# Patient Record
Sex: Female | Born: 1955 | Race: White | Hispanic: No | Marital: Married | Smoking: Current every day smoker
Health system: Southern US, Community
[De-identification: ages and names within clinical notes are randomized; demographics above are authoritative.]

## PROBLEM LIST (undated history)

## (undated) DIAGNOSIS — Z720 Tobacco use: Secondary | ICD-10-CM

## (undated) DIAGNOSIS — I4892 Unspecified atrial flutter: Secondary | ICD-10-CM

## (undated) DIAGNOSIS — R911 Solitary pulmonary nodule: Secondary | ICD-10-CM

## (undated) DIAGNOSIS — Z973 Presence of spectacles and contact lenses: Secondary | ICD-10-CM

## (undated) DIAGNOSIS — E559 Vitamin D deficiency, unspecified: Secondary | ICD-10-CM

## (undated) DIAGNOSIS — I739 Peripheral vascular disease, unspecified: Secondary | ICD-10-CM

## (undated) DIAGNOSIS — C801 Malignant (primary) neoplasm, unspecified: Secondary | ICD-10-CM

## (undated) DIAGNOSIS — I10 Essential (primary) hypertension: Secondary | ICD-10-CM

## (undated) DIAGNOSIS — E785 Hyperlipidemia, unspecified: Secondary | ICD-10-CM

## (undated) DIAGNOSIS — J449 Chronic obstructive pulmonary disease, unspecified: Secondary | ICD-10-CM

## (undated) HISTORY — PX: TUBAL LIGATION: SHX77

## (undated) HISTORY — PX: OVARIAN CYST REMOVAL: SHX89

## (undated) HISTORY — PX: TONSILLECTOMY: SUR1361

---

## 2019-02-09 ENCOUNTER — Other Ambulatory Visit (HOSPITAL_COMMUNITY): Payer: Self-pay | Admitting: Internal Medicine

## 2019-02-09 ENCOUNTER — Ambulatory Visit (HOSPITAL_COMMUNITY)
Admission: RE | Admit: 2019-02-09 | Discharge: 2019-02-09 | Disposition: A | Discharge status: Home / Self Care | Payer: Medicare Other | Source: Ambulatory Visit | Attending: Family | Admitting: Family

## 2019-02-09 ENCOUNTER — Other Ambulatory Visit: Payer: Self-pay

## 2019-02-09 DIAGNOSIS — R0989 Other specified symptoms and signs involving the circulatory and respiratory systems: Secondary | ICD-10-CM | POA: Diagnosis not present

## 2019-02-21 ENCOUNTER — Other Ambulatory Visit: Payer: Self-pay | Admitting: Internal Medicine

## 2019-02-21 DIAGNOSIS — R011 Cardiac murmur, unspecified: Secondary | ICD-10-CM

## 2019-02-22 ENCOUNTER — Other Ambulatory Visit: Payer: Self-pay

## 2019-02-22 ENCOUNTER — Ambulatory Visit (INDEPENDENT_AMBULATORY_CARE_PROVIDER_SITE_OTHER): Payer: Medicare Other | Admitting: Cardiology

## 2019-02-22 ENCOUNTER — Ambulatory Visit: Payer: Medicare Other

## 2019-02-22 ENCOUNTER — Other Ambulatory Visit: Payer: Self-pay | Admitting: Cardiology

## 2019-02-22 ENCOUNTER — Encounter (HOSPITAL_COMMUNITY): Payer: Self-pay | Admitting: Cardiology

## 2019-02-22 ENCOUNTER — Emergency Department (HOSPITAL_COMMUNITY)
Admission: EM | Admit: 2019-02-22 | Discharge: 2019-02-22 | Disposition: A | Discharge status: Home / Self Care | Payer: Medicare Other | Attending: Emergency Medicine | Admitting: Emergency Medicine

## 2019-02-22 DIAGNOSIS — I483 Typical atrial flutter: Secondary | ICD-10-CM

## 2019-02-22 DIAGNOSIS — R011 Cardiac murmur, unspecified: Secondary | ICD-10-CM

## 2019-02-22 DIAGNOSIS — I4892 Unspecified atrial flutter: Secondary | ICD-10-CM

## 2019-02-22 DIAGNOSIS — R000 Tachycardia, unspecified: Secondary | ICD-10-CM

## 2019-02-22 HISTORY — DX: Peripheral vascular disease, unspecified: I73.9

## 2019-02-22 HISTORY — DX: Hyperlipidemia, unspecified: E78.5

## 2019-02-22 HISTORY — DX: Essential (primary) hypertension: I10

## 2019-02-22 HISTORY — DX: Tobacco use: Z72.0

## 2019-02-22 MED ORDER — APIXABAN 5 MG PO TABS: 5.0000 mg | ORAL_TABLET | Freq: Two times a day (BID) | ORAL | 2 refills | Status: DC

## 2019-02-22 MED ORDER — DILTIAZEM HCL 30 MG PO TABS: 30.0000 mg | ORAL_TABLET | Freq: Three times a day (TID) | ORAL | 2 refills | Status: DC | PRN

## 2019-02-22 NOTE — ED Triage Notes (Signed)
Cardiology NP called to inform me they were sending this pt due to echo showing atrial flutter. Pt denies any CP or sob.

## 2019-02-22 NOTE — ED Provider Notes (Signed)
MSE was initiated and I personally evaluated the patient and placed orders (if any) at  5:31 PM on February 22, 2019.  The patient appears stable so that the remainder of the MSE may be completed by another provider.  Transferred from Drs. Office for atrial flutter with tachycardia.  However upon arrival to the ER peers to be in sinus rhythm.  Seen in the ER Dr. Virgina Jock, cardiologist.  He did the primary care of the patient and I only did an initial screening.   Davonna Belling, MD 02/22/19 1731

## 2019-02-22 NOTE — Consult Note (Signed)
CARDIOLOGY CONSULT NOTE  [name] MRN: [id number] DOB/AGE: [birth year] 63 y.o.  Admit date: 02/22/2019 Referring Physician: [facility] Primary Physician: Dr. [name] Reason for Consultation:  Tachycardia  HPI:   63 year old Caucasian female with hypertension, hyperlipidemia, tobacco abuse, subclavian and carotid stenoses, with episode of tachycardia.  Patient was referred for outpatient echocardiogram for a cardiac murmur by her PCP Dr. Haynes Kerns.  During echocardiogram, patient was found to be in tachycardia.  On questioning, patient reports that she has been having palpitations since noon today.  She denied any chest pain, shortness of breath.  She was sent to the ED.  On arrival to the ED, patient was back to normal rhythm.  At baseline, she is exertional dyspnea, which is stable.  She does endorse bilateral arm, as well as occasional lower extremity claudication.  It appears that she was scheduled to undergo CT scan through her PCP.   Past Medical History:  Diagnosis Date  . Hyperlipidemia   . Hypertension   . Tobacco abuse      History reviewed. No pertinent surgical history.   Family History  Problem Relation Age of Onset  . Heart disease Neg Hx      Social History: Social History   Socioeconomic History  . Marital status: Married    Spouse name: Not on file  . Number of children: Not on file  . Years of education: Not on file  . Highest education level: Not on file  Occupational History  . Not on file  Social Needs  . Financial resource strain: Not on file  . Food insecurity    Worry: Not on file    Inability: Not on file  . Transportation needs    Medical: Not on file    Non-medical: Not on file  Tobacco Use  . Smoking status: Not on file  Substance and Sexual Activity  . Alcohol use: Not on file  . Drug use: Not on file  . Sexual activity: Not on file  Lifestyle  . Physical activity    Days per week: Not on file    Minutes  per session: Not on file  . Stress: Not on file  Relationships  . Social Herbalist on phone: Not on file    Gets together: Not on file    Attends religious service: Not on file    Active member of club or organization: Not on file    Attends meetings of clubs or organizations: Not on file    Relationship status: Not on file  . Intimate partner violence    Fear of current or ex partner: Not on file    Emotionally abused: Not on file    Physically abused: Not on file    Forced sexual activity: Not on file  Other Topics Concern  . Not on file  Social History Narrative  . Not on file     Review of Systems  Constitution: Negative for decreased appetite, malaise/fatigue, weight gain and weight loss.  HENT: Negative for congestion.   Eyes: Negative for visual disturbance.  Cardiovascular: Positive for claudication and dyspnea on exertion (Chronic, stable). Negative for chest pain, leg swelling, palpitations and syncope.  Respiratory: Negative for cough.   Endocrine: Negative for cold intolerance.  Hematologic/Lymphatic: Does not bruise/bleed easily.  Skin: Negative for itching and rash.  Musculoskeletal: Negative for myalgias.  Gastrointestinal: Negative for abdominal pain, nausea and vomiting.  Genitourinary: Negative for dysuria.  Neurological: Negative for  dizziness and weakness.  Psychiatric/Behavioral: The patient is not nervous/anxious.   All other systems reviewed and are negative.     Physical Exam: Physical Exam  Constitutional: She is oriented to person, place, and time. She appears well-developed and well-nourished. No distress.  HENT:  Head: Normocephalic and atraumatic.  Eyes: Pupils are equal, round, and reactive to light. Conjunctivae are normal.  Neck: No JVD present.  Cardiovascular: Normal rate, regular rhythm and intact distal pulses. Exam reveals decreased pulses.  Murmur heard.  Harsh early systolic murmur is present with a grade of 2/6 at the  upper right sternal border radiating to the neck. Pulmonary/Chest: Effort normal and breath sounds normal. She has no wheezes. She has no rales.  Abdominal: Soft. Bowel sounds are normal. There is no rebound.  Musculoskeletal:        General: No edema.  Lymphadenopathy:    She has no cervical adenopathy.  Neurological: She is alert and oriented to person, place, and time. No cranial nerve deficit.  Skin: Skin is warm and dry.  Psychiatric: She has a normal mood and affect.  Nursing note and vitals reviewed.     Radiology: No results found.  Scheduled Meds: Continuous Infusions: PRN Meds:.  CARDIAC STUDIES:  EKG 02/22/2019 1646: Sinus rhythm 94 bpm. LVH. Nonspecific ST-T abnormality.  EKG 02/22/2019 1613: Typical atrial flutter with RVR 160 bpm. Nonspecific ST-T changes.   Carotid US 02/09/2019: Right Carotid: Velocities in the right ICA are consistent with a 1-39% stenosis.                Non-hemodynamically significant plaque <50% noted in the CCA.  Left Carotid: Velocities in the left ICA are consistent with a 60-79% stenosis.               Non-hemodynamically significant plaque <50% noted in the CCA.  Vertebrals:  Right vertebral artery demonstrates retrograde flow. Left vertebral              artery demonstrates bidirectional flow. Subclavians: Bilateral subclavian waveforms are monophasic. Waveform morphology              and abnormal flow pattern in bilateral vertebral arteries suggest              proximal obstruction. Unable to visualize proximally.   Assessment & Recommendations:  63 year old Caucasian female with hypertension, hyperlipidemia, tobacco abuse, subclavian and carotid stenoses, with paroxysmal atrial flutter  Paroxysmal atrial flutter: Self converted on arrival to the ED. CHA2DS2VASc score 4, annual stroke risk 5%. Started eliquis 5 mg bid, along with as needed diltiazem 30 mg. I will obtain outpatient labwork through PCP to ensure normal  baseline renal function and CBC (patients reports no abnormalities). Will also check TSH. Will arrange outpatient echocardiogram and event monitor  PAD: Suspect bilateral subclavian stenoses, as well as lower extremity PAD. Recommend regular exercise and aggressive risk factor modification. Will obtain outpatient CTA.  Murmur: Likely mild aortic stenosis. Will obtain outpatient echocardiogram.  Hypertension, hyperlipidemia: Continue olmesartan 20 mg, crestor 20 mg   Nigel Mormon, MD 02/22/2019, 5:43 PM [facility] Pager: [phone] Office: [phone] If no answer Cell [phone]

## 2019-02-22 NOTE — ED Notes (Signed)
Pt discharge instructions reviewed with pt. Pt verbalized understanding of discharge instructions. Pt discharged.

## 2019-02-23 NOTE — Progress Notes (Signed)
Patient was here for echocardiogram vomiting, I was notified by echo tech of elevated heart rate.  EKG was performed revealing atrial flutter with RVR at 162 bpm.  She has been noticing increased fatigue and tiredness over the last few days.  I recommended she be further evaluated in the emergency room. She is not in any acute distress. No difficulty breathing. Her husband is present at the bedside, and will provide transportation.  Dr. Virgina Jock will see the patient in the ER.

## 2019-03-03 ENCOUNTER — Ambulatory Visit (INDEPENDENT_AMBULATORY_CARE_PROVIDER_SITE_OTHER): Payer: Medicare Other

## 2019-03-03 ENCOUNTER — Other Ambulatory Visit: Payer: Self-pay

## 2019-03-03 ENCOUNTER — Ambulatory Visit: Payer: Medicare Other

## 2019-03-03 ENCOUNTER — Ambulatory Visit: Payer: Medicare Other | Admitting: Cardiology

## 2019-03-03 DIAGNOSIS — R011 Cardiac murmur, unspecified: Secondary | ICD-10-CM | POA: Diagnosis not present

## 2019-03-03 DIAGNOSIS — I483 Typical atrial flutter: Secondary | ICD-10-CM

## 2019-03-07 ENCOUNTER — Other Ambulatory Visit: Payer: Self-pay

## 2019-03-07 ENCOUNTER — Telehealth: Payer: Self-pay

## 2019-03-07 NOTE — Telephone Encounter (Signed)
Telephone encounter:  Reason for call: Pt wants to make sure its ok she is on Diltiazem and olmesartan  Usual provider: MP  Last office visit: NA  Next office visit: 03/31/19   Last hospitalization: 02/22/19   Current Outpatient Medications on File Prior to Visit  Medication Sig Dispense Refill  . apixaban (ELIQUIS) 5 MG TABS tablet Take 1 tablet (5 mg total) by mouth 2 (two) times daily. 60 tablet 2  . diltiazem (CARDIZEM) 30 MG tablet Take 1 tablet (30 mg total) by mouth 3 (three) times daily as needed. 30 tablet 2  . olmesartan (BENICAR) 20 MG tablet Take 20 mg by mouth at bedtime.    . rosuvastatin (CRESTOR) 20 MG tablet Take 20 mg by mouth daily.     No current facility-administered medications on file prior to visit.

## 2019-03-07 NOTE — Telephone Encounter (Signed)
S/w pt advised her ok to take both meds.

## 2019-03-07 NOTE — Telephone Encounter (Signed)
Yes

## 2019-03-08 ENCOUNTER — Other Ambulatory Visit: Payer: Self-pay | Admitting: Internal Medicine

## 2019-03-08 DIAGNOSIS — Z720 Tobacco use: Secondary | ICD-10-CM

## 2019-03-30 ENCOUNTER — Ambulatory Visit
Admission: RE | Admit: 2019-03-30 | Discharge: 2019-03-30 | Disposition: A | Discharge status: Home / Self Care | Payer: Medicare Other | Source: Ambulatory Visit | Attending: Internal Medicine | Admitting: Internal Medicine

## 2019-03-30 DIAGNOSIS — Z720 Tobacco use: Secondary | ICD-10-CM

## 2019-03-31 ENCOUNTER — Ambulatory Visit: Payer: Medicare Other | Admitting: Cardiology

## 2019-03-31 ENCOUNTER — Other Ambulatory Visit: Payer: Self-pay

## 2019-03-31 ENCOUNTER — Encounter: Payer: Self-pay | Admitting: Cardiology

## 2019-03-31 VITALS — BP 106/63 | HR 83 | Temp 96.1°F | Resp 16 | Ht 62.0 in | Wt 173.3 lb

## 2019-03-31 DIAGNOSIS — F17200 Nicotine dependence, unspecified, uncomplicated: Secondary | ICD-10-CM | POA: Insufficient documentation

## 2019-03-31 DIAGNOSIS — I739 Peripheral vascular disease, unspecified: Secondary | ICD-10-CM

## 2019-03-31 DIAGNOSIS — I4892 Unspecified atrial flutter: Secondary | ICD-10-CM | POA: Diagnosis not present

## 2019-03-31 DIAGNOSIS — F17210 Nicotine dependence, cigarettes, uncomplicated: Secondary | ICD-10-CM

## 2019-03-31 DIAGNOSIS — R0989 Other specified symptoms and signs involving the circulatory and respiratory systems: Secondary | ICD-10-CM | POA: Insufficient documentation

## 2019-03-31 DIAGNOSIS — I2510 Atherosclerotic heart disease of native coronary artery without angina pectoris: Secondary | ICD-10-CM | POA: Insufficient documentation

## 2019-03-31 DIAGNOSIS — I700 Atherosclerosis of aorta: Secondary | ICD-10-CM | POA: Diagnosis not present

## 2019-03-31 DIAGNOSIS — I351 Nonrheumatic aortic (valve) insufficiency: Secondary | ICD-10-CM

## 2019-03-31 MED ORDER — ASPIRIN EC 81 MG PO TBEC: 81.0000 mg | DELAYED_RELEASE_TABLET | Freq: Every day | ORAL | 3 refills | Status: AC

## 2019-03-31 NOTE — Progress Notes (Signed)
Follow up visit  Subjective:   Betty Anderson, female    DOB: 1955-08-06, 63 y.o.   MRN: [id number]     HPI  63 year old Caucasian female with hypertension, hyperlipidemia, tobacco abuse, subclavian and carotid stenoses, with paroxysmal atrial flutter.  Patient had an episode of tachycardia during her initial echocardiogram visit in November 2020 that led to ED admission.  Atrial flutter was resolved on presentation to the ED.  Patient has been for event monitor since, without any recurrence of atrial flutter.  Work-up thus far has been remarkable for trace aortic stenosis, moderate grade 2 aortic regurgitation, grade 2 diastolic dysfunction on echocardiogram.  Vascular ultrasound was notable for possible bilateral proximal subclavian obstruction.  Patient underwent lung cancer screening CT chest today that showed suspicious lung nodule, paratracheal lymph node, as well as chronic interstitial changes, in addition to emphysema, aortic and coronary atherosclerosis.  Patient is here for follow up today. She denies any chest pain. She has had stable exertional dyspnea for a long time. She has shoulder pain, but denies any specific arm claudication symptoms. She has mild dizziness on getting up from sitting position, but has had it for several years, without any syncope episodes.    Current Outpatient Medications on File Prior to Visit  Medication Sig Dispense Refill  . apixaban (ELIQUIS) 5 MG TABS tablet Take 1 tablet (5 mg total) by mouth 2 (two) times daily. 60 tablet 2  . diltiazem (CARDIZEM) 30 MG tablet Take 1 tablet (30 mg total) by mouth 3 (three) times daily as needed. 30 tablet 2  . olmesartan (BENICAR) 20 MG tablet Take 20 mg by mouth at bedtime.    . rosuvastatin (CRESTOR) 20 MG tablet Take 20 mg by mouth daily.     No current facility-administered medications on file prior to visit.    Cardiovascular & other pertient studies:  EKG 02/22/2019 1646: Sinus rhythm 94  bpm. LVH. Nonspecific ST-T abnormality.  EKG 02/22/2019 1613: Typical atrial flutter with RVR 160 bpm. Nonspecific ST-T changes.  CT Chest lung cancer screening 03/30/2019: 1. Lung-RADS 4A, suspicious. Follow up low-dose chest CT without contrast in 3 months (please use the following order, "CT CHEST LCS NODULE FOLLOW-UP W/O CM") is recommended. Alternatively, PET may be considered when there is a solid component 55mm or larger. 2. Enlarged right paratracheal lymph node. In light of the suspicious nodule in the right upper lobe this is concerning for metastatic adenopathy. 3. Chronic interstitial changes with a lower lung zone predominance which may represent early nonspecific interstitial pneumonia versus usual interstitial pneumonia. Consider further evaluation with high-resolution CT of the chest. 4. Aortic Atherosclerosis (ICD10-I70.0) and Emphysema (ICD10-J43.9). 5. Coronary artery calcifications.   Echocardiogram 03/05/2019: Left ventricle cavity is normal in size. Moderate concentric hypertrophy of the left ventricle. Normal LV systolic function with visual EF 50-55%. Normal global wall motion. Doppler evidence of grade II (pseudonormal) diastolic dysfunction, elevated LAP. Calculated EF 47%. Trileaflet aortic valve with mild aortic valve leaflet calcification. Trace aortic stenosis. Moderate (Grade II) aortic regurgitation. Inadequate TR jet to estimate pulmonary artery systolic pressure. Estimated RA pressure 8 mmHg.  Vascular ultrasound 02/09/2019: Right Carotid: Velocities in the right ICA are consistent with a 1-39% stenosis.                Non-hemodynamically significant plaque <50% noted in the CCA.  Left Carotid: Velocities in the left ICA are consistent with a 60-79% stenosis.  Non-hemodynamically significant plaque <50% noted in the CCA.  Vertebrals:  Right vertebral artery demonstrates retrograde flow. Left vertebral              artery  demonstrates bidirectional flow. Subclavians: Bilateral subclavian waveforms are monophasic. Waveform morphology              and abnormal flow pattern in bilateral vertebral arteries suggest              proximal obstruction. Unable to visualize proximally.   Recent labs: Not available   Review of Systems  Constitution: Negative for decreased appetite, malaise/fatigue, weight gain and weight loss.  HENT: Negative for congestion.   Eyes: Negative for visual disturbance.  Cardiovascular: Positive for dyspnea on exertion. Negative for chest pain, leg swelling, palpitations and syncope.  Respiratory: Positive for cough.   Endocrine: Negative for cold intolerance.  Hematologic/Lymphatic: Does not bruise/bleed easily.  Skin: Negative for itching and rash.  Musculoskeletal: Negative for myalgias.  Gastrointestinal: Negative for abdominal pain, nausea and vomiting.  Genitourinary: Negative for dysuria.  Neurological: Negative for dizziness and weakness.  Psychiatric/Behavioral: The patient is not nervous/anxious.   All other systems reviewed and are negative.        Vitals:   03/31/19 1157  BP: 106/63  Resp: 16  Temp: (!) 96.1 F (35.6 C)  SpO2: 100%    Body mass index is 31.7 kg/m. Filed Weights   03/31/19 1157  Weight: 173 lb 4.8 oz (78.6 kg)     Objective:   Physical Exam  Constitutional: She is oriented to person, place, and time. She appears well-developed and well-nourished. No distress.  HENT:  Head: Normocephalic and atraumatic.  Eyes: Pupils are equal, round, and reactive to light. Conjunctivae are normal.  Neck: No JVD present.  Cardiovascular: Normal rate and regular rhythm.  Murmur heard.  Early systolic murmur is present with a grade of 2/6 at the upper right sternal border.  Early diastolic murmur is present with a grade of 2/6 at the upper right sternal border. No critical limb iscehmia Pulses:      Carotid pulses are on the right side with bruit  and on the left side with bruit.      Dorsalis pedis pulses are 0 on the right side and 0 on the left side.       Posterior tibial pulses are 0 on the right side and 0 on the left side.  Pulmonary/Chest: Effort normal and breath sounds normal. She has no wheezes. She has no rales.  Abdominal: Soft. Bowel sounds are normal. There is no rebound.  Musculoskeletal:        General: No edema.  Lymphadenopathy:    She has no cervical adenopathy.  Neurological: She is alert and oriented to person, place, and time. No cranial nerve deficit.  Skin: Skin is warm and dry.  Psychiatric: She has a normal mood and affect.  Nursing note and vitals reviewed.         Assessment & Recommendations:   63 year old Caucasian female with hypertension, hyperlipidemia, tobacco abuse, PAD with subclavian and carotid stenoses, with paroxysmal atrial flutter, now with suspicious lung nodule.  Paroxysmal atrial flutter: CHA2DS2VASc score 4, annual stroke risk 5%.  No recurrence after one episode in 01/2019. Can stop eliquis.  Ok to stop diltiazem 30 mg tid  Trace AS, mod AI: Clinically asymptomatic.  Will repeat echocardiogram in 1 year,  CAD, PAD: She has coronary and aortic atherosclerosis. She has moderate carotid as well  as possibly severe subclavian stenoses. She is asymptomatic from these. Recommend aggressive risk factor modification. Recommend Aspirin 81 mg daily. Continue rosuvastatin. Most importantly, needs to quit smoking.  Lung nodule: Seen on lung cancer CT chest ordered by Dr. Joylene Draft, and performed on 03/30/2019. Patient has not received the results yet. She has appt with Dr. Joylene Draft in February. She will likely need follow up studies, bases on the findings. I will noto=ify Dr. Joylene Draft of the results, so he can continue further workup, as necessary.   Tobacco cessation counseling: - Currently smoking 3 packs/day   - Patient was informed of the dangers of tobacco abuse including stroke, cancer,  and MI, as well as benefits of tobacco cessation. - Patient is NOT willing to quit at this time. - Approximately 5 mins were spent counseling patient cessation techniques. We discussed various methods to help quit smoking, including deciding on a date to quit, joining a support group, pharmacological agents. - I will reassess her progress at the next follow-up visit   Coronary atherosclerosis, PAD:    Nigel Mormon, MD [facility] Pager: [phone] Office: [phone]

## 2019-04-06 ENCOUNTER — Ambulatory Visit (INDEPENDENT_AMBULATORY_CARE_PROVIDER_SITE_OTHER): Payer: Medicare Other | Admitting: Pulmonary Disease

## 2019-04-06 ENCOUNTER — Encounter: Payer: Self-pay | Admitting: Pulmonary Disease

## 2019-04-06 ENCOUNTER — Other Ambulatory Visit: Payer: Self-pay

## 2019-04-06 DIAGNOSIS — C3490 Malignant neoplasm of unspecified part of unspecified bronchus or lung: Secondary | ICD-10-CM | POA: Diagnosis not present

## 2019-04-06 DIAGNOSIS — R918 Other nonspecific abnormal finding of lung field: Secondary | ICD-10-CM

## 2019-04-06 DIAGNOSIS — R590 Localized enlarged lymph nodes: Secondary | ICD-10-CM

## 2019-04-06 NOTE — Progress Notes (Signed)
Virtual Visit via Telephone Note  I connected with Betty Anderson on 04/06/19 at  2:00 PM EST by telephone and verified that I am speaking with the correct person using two identifiers.  Location: Patient: Betty Anderson  Provider: Garner Nash, DO   I discussed the limitations, risks, security and privacy concerns of performing an evaluation and management service by telephone and the availability of in person appointments. I also discussed with the patient that there may be a patient responsible charge related to this service. The patient expressed understanding and agreed to proceed.  History of Present Illness:  This is a 64 year old female, longstanding history of smoking, 50 years 3 packs a day at her max.  Patient had a lung cancer screening CT on March 30, 2019.  Patient was referred to our office for evaluation of tissue sampling and bronchoscopy by oncology.  Patient states today that she has not received any information about her recent lung cancer screening CT.  We went over this today in detail via phone.  She understands that we are concerned about a solid to 13.8 mm spiculated nodule as well as a enlarged 1.9 cm right paratracheal lymph node. These findings are concerning for a underlying malignancy.  Patient denies fevers chills night sweats weight loss.  Patient also denies hemoptysis.   Observations/Objective:  Able to converse in complete sentences.  Appears alert oriented. Has good insight and understanding to our discussion today  Assessment and Plan:  Abnormal lung cancer screening CT Enlarged right paratracheal lymph node suspicious for metastatic adenopathy Associated spiculated right upper lobe nodule Longstanding tobacco abuse history  Plan:  We will set patient up for video bronchoscopy with endobronchial ultrasound. Discussed procedure in detail to include risk benefits and alternatives. Patient was agreeable to proceed.  We will attempt to  schedule for Tuesday of next week. Patient will likely need PET scan as well as brain MRI I have placed these orders to get this started as well.  Case ID# [id number]  Jan 12th @ 10:00AM [facility] OR    Follow Up Instructions:  Bronchoscopy appt next week. This will likely be on Tuesday.    I discussed the assessment and treatment plan with the patient. The patient was provided an opportunity to ask questions and all were answered. The patient agreed with the plan and demonstrated an understanding of the instructions.   The patient was advised to call back or seek an in-person evaluation if the symptoms worsen or if the condition fails to improve as anticipated.  I provided 32 minutes of non-face-to-face time during this encounter.   Garner Nash, DO

## 2019-04-06 NOTE — Patient Instructions (Addendum)
Thank you for visiting Dr. Valeta Harms at [facility]. Today we recommend the following: Orders Placed This Encounter  Procedures  . NM PET - Initial Skull Base To Thigh  . Ambulatory referral to Pulmonology   Follow up in clinic in 4 weeks EBUS Bronchoscopy to be scheduled on 04/11/2018 at [facility]   Return in about 4 weeks (around 05/04/2019).    Please do your part to reduce the spread of COVID-19.

## 2019-04-09 ENCOUNTER — Other Ambulatory Visit (HOSPITAL_COMMUNITY)
Admission: RE | Admit: 2019-04-09 | Discharge: 2019-04-09 | Disposition: A | Discharge status: Home / Self Care | Payer: Medicare Other | Source: Ambulatory Visit | Attending: Pulmonary Disease | Admitting: Pulmonary Disease

## 2019-04-09 DIAGNOSIS — Z01812 Encounter for preprocedural laboratory examination: Secondary | ICD-10-CM | POA: Insufficient documentation

## 2019-04-09 DIAGNOSIS — Z20822 Contact with and (suspected) exposure to covid-19: Secondary | ICD-10-CM | POA: Insufficient documentation

## 2019-04-09 LAB — SARS CORONAVIRUS 2 (TAT 6-24 HRS): SARS Coronavirus 2: NEGATIVE

## 2019-04-11 ENCOUNTER — Encounter (HOSPITAL_COMMUNITY): Payer: Self-pay | Admitting: Pulmonary Disease

## 2019-04-11 ENCOUNTER — Other Ambulatory Visit: Payer: Self-pay

## 2019-04-11 NOTE — Anesthesia Preprocedure Evaluation (Addendum)
Anesthesia Evaluation  Patient identified by MRN, date of birth, ID band Patient awake    Reviewed: Allergy & Precautions, NPO status , Patient's Chart, lab work & pertinent test results  Airway Mallampati: I  TM Distance: >3 FB Neck ROM: Full    Dental  (+) Edentulous Upper, Edentulous Lower   Pulmonary COPD, Current Smoker,     + decreased breath sounds      Cardiovascular hypertension, Pt. on medications + CAD and + Peripheral Vascular Disease  + dysrhythmias Atrial Fibrillation  Rhythm:Regular Rate:Normal     Neuro/Psych negative neurological ROS  negative psych ROS   GI/Hepatic negative GI ROS, Neg liver ROS,   Endo/Other  negative endocrine ROS  Renal/GU negative Renal ROS     Musculoskeletal negative musculoskeletal ROS (+)   Abdominal Normal abdominal exam  (+)   Peds  Hematology negative hematology ROS (+)   Anesthesia Other Findings   Reproductive/Obstetrics                           COVID-19 Labs  No results for input(s): DDIMER, FERRITIN, LDH, CRP in the last 72 hours.  Lab Results  Component Value Date   [place] NEGATIVE 04/09/2019     Anesthesia Physical Anesthesia Plan  ASA: III  Anesthesia Plan: General   Post-op Pain Management:    Induction: Intravenous  PONV Risk Score and Plan: Ondansetron  Airway Management Planned: LMA  Additional Equipment: None  Intra-op Plan:   Post-operative Plan: Extubation in OR  Informed Consent: I have reviewed the patients History and Physical, chart, labs and discussed the procedure including the risks, benefits and alternatives for the proposed anesthesia with the patient or authorized representative who has indicated his/her understanding and acceptance.       Plan Discussed with: CRNA  Anesthesia Plan Comments: (Follows with Dr. Virgina Jock for hx of subclavian and carotid stenoses,paroxysmal atrial flutter.  Patient had an episode of tachycardia during her initial echocardiogram visit in November 2020 that led to ED admission.  Atrial flutter was resolved on presentation to the ED.  Patient then wore event monitor without any recurrence of atrial flutter.  Echo showed trace aortic stenosis, moderate grade 2 aortic regurgitation, grade 2 diastolic dysfunction.  Vascular ultrasound was notable for possible bilateral proximal subclavian obstruction.    Last seen by Dr. Virgina Jock 03/31/19. Per note,  "Paroxysmal atrial flutter: CHA2DS2VASc score 4, annual stroke risk 5%.  No recurrence after one episode in 01/2019. Can stop eliquis.  Ok to stop diltiazem 30 mg tid. Trace AS, mod AI: Clinically asymptomatic. Will repeat echocardiogram in 1 year. CAD, PAD: She has coronary and aortic atherosclerosis. She has moderate carotid as well as possibly severe subclavian stenoses. She is asymptomatic from these. Recommend aggressive risk factor modification. Recommend Aspirin 81 mg daily. Continue rosuvastatin. Most importantly, needs to quit smoking."  Recent lung cancer screening CT showed suspicious lung nodule, paratracheal lymph node, as well as chronic interstitial changes, in addition to emphysema, aortic and coronary atherosclerosis.  Will need DOS labs.  EKG 02/22/19: Normal sinus rhythm. Rate 94. Right atrial enlargement. Minimal voltage criteria for LVH, may be normal variant ( Sokolow-Lyon ). Nonspecific ST and T wave abnormality  Event monitor 03/03/2019 - 04/01/2019: Diagnostic time: 90%  Dominant rhythm: Sinus. HR 58-104 bpm. Avg HR 71 bpm. No atrial fibrillation/atrial flutter/SVT/VT/high grade AV block, sinus pause >3sec noted. Symptoms of chest pain, flutter sensation correlated with sinus rhythm. No arrhythmia noted  to explain patient's symptoms  Echocardiogram 03/05/2019: Left ventricle cavity is normal in size. Moderate concentric hypertrophy of the left ventricle. Normal LV systolic function with  visual EF 50-55%. Normal global wall motion. Doppler evidence of grade II (pseudonormal) diastolic dysfunction, elevated LAP. Calculated EF 47%. Trileaflet aortic valve with mild aortic valve leaflet calcification. Trace aortic stenosis. Moderate (Grade II) aortic regurgitation. Inadequate TR jet to estimate pulmonary artery systolic pressure. Estimated RA pressure 8 mmHg.  Carotid US 02/09/19: Summary: Right Carotid: Velocities in the right ICA are consistent with a 1-39% stenosis. Non-hemodynamically significant plaque <50% noted in the CCA. Left Carotid: Velocities in the left ICA are consistent with a 60-79% stenosis. Non-hemodynamically significant plaque <50% noted in the CCA. Vertebrals:  Right vertebral artery demonstrates retrograde flow. Left vertebral artery demonstrates bidirectional flow. Subclavians: Bilateral subclavian waveforms are monophasic. Waveform morphology and abnormal flow pattern in bilateral vertebral arteries suggest proximal obstruction. Unable to visualize proximally.)      Anesthesia Quick Evaluation

## 2019-04-11 NOTE — Progress Notes (Signed)
SDW-pre-op call completed by pt spouse, Jeneen Rinks, with pt verbal consent. Pt denies any acute cardiopulmonary issues. Spouse stated that pt is under the care of Dr. Virgina Jock, Cardiology and Dr. [name], PCP. Felicia, Surgical Coordinator, stated that pt did not have recent labs at cardiology office visit. Mickel Baas at [facility], Urology, stated that she will fax BMP results drawn 04/04/2019; awaiting fax. Spouse made aware to have pt stop taking vitamins, fish oil and herbal medications. Do not take any NSAIDs ie: Ibuprofen, Advil, Naproxen (Aleve), Motrin, BC and Goody Powder. Spouse verbalized understanding of all pre-op instructions. PA, Anesthesiology, asked to review pt history.

## 2019-04-11 NOTE — Progress Notes (Signed)
Anesthesia Chart Review: Same day workup  Follows with Dr. Virgina Jock for hx of subclavian and carotid stenoses,paroxysmal atrial flutter. Patient had an episode of tachycardia during her initial echocardiogram visit in November 2020 that led to ED admission.  Atrial flutter was resolved on presentation to the ED.  Patient then wore event monitor without any recurrence of atrial flutter.  Echo showed trace aortic stenosis, moderate grade 2 aortic regurgitation, grade 2 diastolic dysfunction.  Vascular ultrasound was notable for possible bilateral proximal subclavian obstruction.    Last seen by Dr. Virgina Jock 03/31/19. Per note,  "Paroxysmal atrial flutter: CHA2DS2VASc score 4, annual stroke risk 5%.  No recurrence after one episode in 01/2019. Can stop eliquis.  Ok to stop diltiazem 30 mg tid. Trace AS, mod AI: Clinically asymptomatic. Will repeat echocardiogram in 1 year. CAD, PAD: She has coronary and aortic atherosclerosis. She has moderate carotid as well as possibly severe subclavian stenoses. She is asymptomatic from these. Recommend aggressive risk factor modification. Recommend Aspirin 81 mg daily. Continue rosuvastatin. Most importantly, needs to quit smoking."  Recent lung cancer screening CT showed suspicious lung nodule, paratracheal lymph node, as well as chronic interstitial changes, in addition to emphysema, aortic and coronary atherosclerosis.  Will need DOS labs.  EKG 02/22/19: Normal sinus rhythm. Rate 94. Right atrial enlargement. Minimal voltage criteria for LVH, may be normal variant ( Sokolow-Lyon ). Nonspecific ST and T wave abnormality  Event monitor 03/03/2019 - 04/01/2019: Diagnostic time: 90%  Dominant rhythm: Sinus. HR 58-104 bpm. Avg HR 71 bpm. No atrial fibrillation/atrial flutter/SVT/VT/high grade AV block, sinus pause >3sec noted. Symptoms of chest pain, flutter sensation correlated with sinus rhythm. No arrhythmia noted to explain patient's  symptoms  Echocardiogram 03/05/2019: Left ventricle cavity is normal in size. Moderate concentric hypertrophy of the left ventricle. Normal LV systolic function with visual EF 50-55%. Normal global wall motion. Doppler evidence of grade II (pseudonormal) diastolic dysfunction, elevated LAP. Calculated EF 47%. Trileaflet aortic valve with mild aortic valve leaflet calcification. Trace aortic stenosis. Moderate (Grade II) aortic regurgitation. Inadequate TR jet to estimate pulmonary artery systolic pressure. Estimated RA pressure 8 mmHg.  Carotid US 02/09/19: Summary: Right Carotid: Velocities in the right ICA are consistent with a 1-39% stenosis. Non-hemodynamically significant plaque <50% noted in the CCA. Left Carotid: Velocities in the left ICA are consistent with a 60-79% stenosis. Non-hemodynamically significant plaque <50% noted in the CCA. Vertebrals:  Right vertebral artery demonstrates retrograde flow. Left vertebral artery demonstrates bidirectional flow. Subclavians: Bilateral subclavian waveforms are monophasic. Waveform morphology and abnormal flow pattern in bilateral vertebral arteries suggest proximal obstruction. Unable to visualize proximally.   Wynonia Musty [facility] Short Stay Center/Anesthesiology Phone [phone] 04/11/2019 4:07 PM]

## 2019-04-12 ENCOUNTER — Ambulatory Visit (HOSPITAL_COMMUNITY): Admission: RE | Admit: 2019-04-12 | Payer: Medicare Other | Source: Home / Self Care | Admitting: Pulmonary Disease

## 2019-04-12 ENCOUNTER — Encounter (HOSPITAL_COMMUNITY): Admission: RE | Payer: Self-pay | Source: Home / Self Care

## 2019-04-12 ENCOUNTER — Encounter (HOSPITAL_COMMUNITY)
Admission: RE | Disposition: A | Discharge status: Home / Self Care | Payer: Self-pay | Source: Home / Self Care | Attending: Pulmonary Disease

## 2019-04-12 ENCOUNTER — Encounter (HOSPITAL_COMMUNITY): Payer: Self-pay | Admitting: Pulmonary Disease

## 2019-04-12 ENCOUNTER — Ambulatory Visit (HOSPITAL_COMMUNITY)
Admission: RE | Admit: 2019-04-12 | Discharge: 2019-04-12 | Disposition: A | Discharge status: Home / Self Care | Payer: Medicare Other | Attending: Pulmonary Disease | Admitting: Pulmonary Disease

## 2019-04-12 ENCOUNTER — Ambulatory Visit (HOSPITAL_COMMUNITY): Payer: Medicare Other | Admitting: Physician Assistant

## 2019-04-12 DIAGNOSIS — Z79899 Other long term (current) drug therapy: Secondary | ICD-10-CM | POA: Insufficient documentation

## 2019-04-12 DIAGNOSIS — I708 Atherosclerosis of other arteries: Secondary | ICD-10-CM | POA: Diagnosis not present

## 2019-04-12 DIAGNOSIS — J439 Emphysema, unspecified: Secondary | ICD-10-CM | POA: Insufficient documentation

## 2019-04-12 DIAGNOSIS — I10 Essential (primary) hypertension: Secondary | ICD-10-CM | POA: Insufficient documentation

## 2019-04-12 DIAGNOSIS — R590 Localized enlarged lymph nodes: Secondary | ICD-10-CM

## 2019-04-12 DIAGNOSIS — C781 Secondary malignant neoplasm of mediastinum: Secondary | ICD-10-CM | POA: Diagnosis not present

## 2019-04-12 DIAGNOSIS — I739 Peripheral vascular disease, unspecified: Secondary | ICD-10-CM | POA: Diagnosis not present

## 2019-04-12 DIAGNOSIS — E785 Hyperlipidemia, unspecified: Secondary | ICD-10-CM | POA: Insufficient documentation

## 2019-04-12 DIAGNOSIS — I2510 Atherosclerotic heart disease of native coronary artery without angina pectoris: Secondary | ICD-10-CM | POA: Insufficient documentation

## 2019-04-12 DIAGNOSIS — J432 Centrilobular emphysema: Secondary | ICD-10-CM

## 2019-04-12 DIAGNOSIS — R911 Solitary pulmonary nodule: Secondary | ICD-10-CM | POA: Diagnosis not present

## 2019-04-12 DIAGNOSIS — Z801 Family history of malignant neoplasm of trachea, bronchus and lung: Secondary | ICD-10-CM | POA: Diagnosis not present

## 2019-04-12 DIAGNOSIS — Z7901 Long term (current) use of anticoagulants: Secondary | ICD-10-CM | POA: Insufficient documentation

## 2019-04-12 DIAGNOSIS — I4891 Unspecified atrial fibrillation: Secondary | ICD-10-CM | POA: Diagnosis not present

## 2019-04-12 DIAGNOSIS — Z720 Tobacco use: Secondary | ICD-10-CM | POA: Diagnosis not present

## 2019-04-12 DIAGNOSIS — F17210 Nicotine dependence, cigarettes, uncomplicated: Secondary | ICD-10-CM | POA: Insufficient documentation

## 2019-04-12 DIAGNOSIS — Z7982 Long term (current) use of aspirin: Secondary | ICD-10-CM | POA: Insufficient documentation

## 2019-04-12 DIAGNOSIS — I6529 Occlusion and stenosis of unspecified carotid artery: Secondary | ICD-10-CM | POA: Diagnosis not present

## 2019-04-12 DIAGNOSIS — F17200 Nicotine dependence, unspecified, uncomplicated: Secondary | ICD-10-CM | POA: Diagnosis not present

## 2019-04-12 DIAGNOSIS — I700 Atherosclerosis of aorta: Secondary | ICD-10-CM | POA: Diagnosis not present

## 2019-04-12 DIAGNOSIS — C801 Malignant (primary) neoplasm, unspecified: Secondary | ICD-10-CM | POA: Diagnosis not present

## 2019-04-12 DIAGNOSIS — R05 Cough: Secondary | ICD-10-CM

## 2019-04-12 DIAGNOSIS — E559 Vitamin D deficiency, unspecified: Secondary | ICD-10-CM | POA: Diagnosis not present

## 2019-04-12 DIAGNOSIS — R918 Other nonspecific abnormal finding of lung field: Secondary | ICD-10-CM

## 2019-04-12 DIAGNOSIS — C3411 Malignant neoplasm of upper lobe, right bronchus or lung: Secondary | ICD-10-CM

## 2019-04-12 DIAGNOSIS — C3490 Malignant neoplasm of unspecified part of unspecified bronchus or lung: Secondary | ICD-10-CM

## 2019-04-12 HISTORY — PX: VIDEO BRONCHOSCOPY: SHX5072

## 2019-04-12 HISTORY — PX: BRONCHIAL NEEDLE ASPIRATION BIOPSY: SHX5106

## 2019-04-12 HISTORY — DX: Chronic obstructive pulmonary disease, unspecified: J44.9

## 2019-04-12 HISTORY — PX: ENDOBRONCHIAL ULTRASOUND: SHX5096

## 2019-04-12 HISTORY — DX: Solitary pulmonary nodule: R91.1

## 2019-04-12 HISTORY — DX: Vitamin D deficiency, unspecified: E55.9

## 2019-04-12 HISTORY — DX: Unspecified atrial flutter: I48.92

## 2019-04-12 HISTORY — DX: Presence of spectacles and contact lenses: Z97.3

## 2019-04-12 LAB — COMPREHENSIVE METABOLIC PANEL
ALT: 17 U/L (ref 0–44)
AST: 19 U/L (ref 15–41)
Albumin: 3.7 g/dL (ref 3.5–5.0)
Alkaline Phosphatase: 126 U/L (ref 38–126)
Anion gap: 8 (ref 5–15)
BUN: 14 mg/dL (ref 8–23)
CO2: 24 mmol/L (ref 22–32)
Calcium: 9.5 mg/dL (ref 8.9–10.3)
Chloride: 103 mmol/L (ref 98–111)
Creatinine, Ser: 1.33 mg/dL — ABNORMAL HIGH (ref 0.44–1.00)
GFR calc Af Amer: 48 mL/min — ABNORMAL LOW (ref >60)
GFR calc non Af Amer: 42 mL/min — ABNORMAL LOW (ref >60)
Glucose, Bld: 88 mg/dL (ref 70–99)
Potassium: 4.5 mmol/L (ref 3.5–5.1)
Sodium: 135 mmol/L (ref 135–145)
Total Bilirubin: 0.5 mg/dL (ref 0.3–1.2)
Total Protein: 7.8 g/dL (ref 6.5–8.1)

## 2019-04-12 LAB — CBC
HCT: 40 % (ref 36.0–46.0)
Hemoglobin: 13.4 g/dL (ref 12.0–15.0)
MCH: 32.4 pg (ref 26.0–34.0)
MCHC: 33.5 g/dL (ref 30.0–36.0)
MCV: 96.6 fL (ref 80.0–100.0)
Platelets: 383 10*3/uL (ref 150–400)
RBC: 4.14 MIL/uL (ref 3.87–5.11)
RDW: 13.9 % (ref 11.5–15.5)
WBC: 8.1 10*3/uL (ref 4.0–10.5)
nRBC: 0 % (ref 0.0–0.2)

## 2019-04-12 LAB — PROTIME-INR
INR: 1 (ref 0.8–1.2)
Prothrombin Time: 12.7 seconds (ref 11.4–15.2)

## 2019-04-12 LAB — APTT: aPTT: 27 seconds (ref 24–36)

## 2019-04-12 SURGERY — BRONCHOSCOPY, WITH EBUS
Anesthesia: General

## 2019-04-12 SURGERY — VIDEO BRONCHOSCOPY WITHOUT FLUORO
Anesthesia: General

## 2019-04-12 MED ORDER — DEXAMETHASONE SODIUM PHOSPHATE 10 MG/ML IJ SOLN
INTRAMUSCULAR | Status: DC | PRN
  Administered 2019-04-12: 10 mg via INTRAVENOUS

## 2019-04-12 MED ORDER — ONDANSETRON HCL 4 MG/2ML IJ SOLN
INTRAMUSCULAR | Status: DC | PRN
  Administered 2019-04-12: 4 mg via INTRAVENOUS

## 2019-04-12 MED ORDER — LACTATED RINGERS IV SOLN: INTRAVENOUS | Status: DC

## 2019-04-12 MED ORDER — FENTANYL CITRATE (PF) 250 MCG/5ML IJ SOLN
INTRAMUSCULAR | Status: DC | PRN
  Administered 2019-04-12 (×2): 25 ug via INTRAVENOUS
  Administered 2019-04-12: 50 ug via INTRAVENOUS

## 2019-04-12 MED ORDER — PROPOFOL 10 MG/ML IV BOLUS
INTRAVENOUS | Status: DC | PRN
  Administered 2019-04-12: 60 mg via INTRAVENOUS
  Administered 2019-04-12: 140 mg via INTRAVENOUS

## 2019-04-12 MED ORDER — ALBUMIN HUMAN 5 % IV SOLN: INTRAVENOUS | Status: DC | PRN

## 2019-04-12 MED ORDER — PHENYLEPHRINE 40 MCG/ML (10ML) SYRINGE FOR IV PUSH (FOR BLOOD PRESSURE SUPPORT)
PREFILLED_SYRINGE | INTRAVENOUS | Status: DC | PRN
  Administered 2019-04-12 (×4): 80 ug via INTRAVENOUS

## 2019-04-12 MED ORDER — LIDOCAINE HCL (PF) 1 % IJ SOLN
INTRAMUSCULAR | Status: DC | PRN
  Administered 2019-04-12: 2 mL
  Administered 2019-04-12: 4 mL
  Administered 2019-04-12: 2 mL

## 2019-04-12 MED ORDER — ACETAMINOPHEN 325 MG PO TABS: 325.0000 mg | ORAL_TABLET | Freq: Once | ORAL | Status: DC | PRN

## 2019-04-12 MED ORDER — LIDOCAINE 2% (20 MG/ML) 5 ML SYRINGE
INTRAMUSCULAR | Status: DC | PRN
  Administered 2019-04-12: 60 mg via INTRAVENOUS

## 2019-04-12 MED ORDER — ALBUMIN HUMAN 5 % IV SOLN
12.5000 g | Freq: Once | INTRAVENOUS | Status: AC
  Administered 2019-04-12: 12.5 g via INTRAVENOUS

## 2019-04-12 MED ORDER — ESMOLOL HCL 100 MG/10ML IV SOLN
INTRAVENOUS | Status: DC | PRN
  Administered 2019-04-12: 20 ug via INTRAVENOUS

## 2019-04-12 MED ORDER — FENTANYL CITRATE (PF) 100 MCG/2ML IJ SOLN
INTRAMUSCULAR | Status: AC
  Filled 2019-04-12: qty 2

## 2019-04-12 MED ORDER — EPHEDRINE SULFATE-NACL 50-0.9 MG/10ML-% IV SOSY
PREFILLED_SYRINGE | INTRAVENOUS | Status: DC | PRN
  Administered 2019-04-12 (×3): 10 mg via INTRAVENOUS

## 2019-04-12 MED ORDER — ACETAMINOPHEN 160 MG/5ML PO SOLN: 325.0000 mg | Freq: Once | ORAL | Status: DC | PRN

## 2019-04-12 MED ORDER — LIDOCAINE HCL (PF) 1 % IJ SOLN
INTRAMUSCULAR | Status: AC
  Filled 2019-04-12: qty 30

## 2019-04-12 MED ORDER — PHENYLEPHRINE HCL-NACL 10-0.9 MG/250ML-% IV SOLN
INTRAVENOUS | Status: DC | PRN
  Administered 2019-04-12: 25 ug/min via INTRAVENOUS

## 2019-04-12 MED ORDER — MEPERIDINE HCL 100 MG/ML IJ SOLN: 6.2500 mg | INTRAMUSCULAR | Status: DC | PRN

## 2019-04-12 MED ORDER — ACETAMINOPHEN 10 MG/ML IV SOLN: 1000.0000 mg | Freq: Once | INTRAVENOUS | Status: DC | PRN

## 2019-04-12 MED ORDER — PROMETHAZINE HCL 25 MG/ML IJ SOLN: 6.2500 mg | INTRAMUSCULAR | Status: DC | PRN

## 2019-04-12 NOTE — Progress Notes (Addendum)
Patient arrived to PACU hypotensive, systolic in the 79'U.  CRNA stated systolic was in the 38'B at the beginning of the case.  Patient arrived today to PreOp with systolic in the 33'O, which patient states is her norm having recently started BP meds. Patient received Albumin in PACU per Smith Robert, MD.  Current pressure upon Discharge is 77/59 (66).  Patient is fully Alert & Oriented, ambulated to the bathroom independently, states feels ready and fine to go home. Patient instructed to not take BP med tonight, to skip today's dose and restart tomorrow. Patient is capable of checking her own BP at home & will do so today. Smith Robert, [facility] [facility].

## 2019-04-12 NOTE — Discharge Instructions (Signed)
Flexible Bronchoscopy, Care After This sheet gives you information about how to care for yourself after your test. Your doctor may also give you more specific instructions. If you have problems or questions, contact your doctor. Follow these instructions at home: Eating and drinking  The day after the test, go back to your normal diet. Driving  Do not drive for 24 hours if you were given a medicine to help you relax (sedative).  Do not drive or use heavy machinery while taking prescription pain medicine. General instructions   Take over-the-counter and prescription medicines only as told by your doctor.  Return to your normal activities as told. Ask what activities are safe for you.  Do not use any products that have nicotine or tobacco in them. This includes cigarettes and e-cigarettes. If you need help quitting, ask your doctor.  Keep all follow-up visits as told by your doctor. This is important. It is very important if you had a tissue sample (biopsy) taken. Get help right away if:  You have shortness of breath that gets worse.  You get light-headed.  You feel like you are going to pass out (faint).  You have chest pain.  You cough up: ? More than a little blood. ? More blood than before. Summary  Do not eat or drink anything (not even water) for 2 hours after your test, or until your numbing medicine wears off.  Do not use cigarettes. Do not use e-cigarettes.  Get help right away if you have chest pain. This information is not intended to replace advice given to you by your health care provider. Make sure you discuss any questions you have with your health care provider. Document Revised: 02/27/2017 Document Reviewed: 04/04/2016 Elsevier Patient Education  2020 [employer].

## 2019-04-12 NOTE — Interval H&P Note (Signed)
History and Physical Interval Note:  04/12/2019 7:47 AM  Betty Anderson  has presented today for surgery, with the diagnosis of LUNG MASS.  The various methods of treatment have been discussed with the patient and family. After consideration of risks, benefits and other options for treatment, the patient has consented to  Procedure(s): VIDEO BRONCHOSCOPY WITHOUT FLUORO (N/A) ENDOBRONCHIAL ULTRASOUND (N/A) as a surgical intervention.  The patient's history has been reviewed, patient examined, no change in status, stable for surgery.  I have reviewed the patient's chart and labs.  Questions were answered to the patient's satisfaction.    Discussed risks benefits and alternatives. No barriers to proceed.  [place]

## 2019-04-12 NOTE — Op Note (Signed)
Video Bronchoscopy with Endobronchial Ultrasound Procedure Note  Date of Operation: 04/12/2019  Pre-op Diagnosis: Mediastinal adenopathy  Post-op Diagnosis: Mediastinal adenopathy  Surgeon: Garner Nash, DO   Assistants: None   Anesthesia: General LMA anesthesia  Operation: Flexible video fiberoptic bronchoscopy with endobronchial ultrasound and biopsies.  Estimated Blood Loss: Minimal, <4MG   Complications: None   Indications and History: Betty Anderson is a 64 y.o. female with longstanding history of tobacco abuse, right upper lobe lung nodule with associated mediastinal adenopathy.  The risks, benefits, complications, treatment options and expected outcomes were discussed with the patient.  The possibilities of pneumothorax, pneumonia, reaction to medication, pulmonary aspiration, perforation of a viscus, bleeding, failure to diagnose a condition and creating a complication requiring transfusion or operation were discussed with the patient who freely signed the consent.    Description of Procedure: The patient was examined in the preoperative area and history and data from the preprocedure consultation were reviewed. It was deemed appropriate to proceed.  The patient was taken to [facility] endoscopy room 1, identified as Betty Anderson and the procedure verified as Flexible Video Fiberoptic Bronchoscopy.  A Time Out was held and the above information confirmed. After being taken to the operating room general anesthesia was initiated and the patient  was orally intubated. The video fiberoptic bronchoscope was introduced via the LMA and 1% lidocaine for a total of 10 cc was used to topicalized the vocal cords, trachea, main carina right and left mainstem.  And a general inspection was performed which showed normal right and left lung anatomy, no evidence of endobronchial lesion, bronchiectatic openings with scattered pitting and striations consistent with longstanding tobacco abuse. The  standard scope was then withdrawn and the endobronchial ultrasound was used to identify and characterize the peritracheal, hilar and bronchial lymph nodes. Inspection showed enlarged station 4R, right paratracheal lymphadenopathy, enlarged station 7 subcarinal lymphadenopathy no hilar nodes identified.. Using real-time ultrasound guidance Wang needle biopsies were take from Station 4R, 7 nodes and were sent for cytology. The patient tolerated the procedure well without apparent complications. There was no significant blood loss. The bronchoscope was withdrawn. Anesthesia was reversed and the patient was taken to the PACU for recovery.   Samples: 1. Wang needle biopsies from 4R node, 1 slide, 8 passes into cellblock 2. Wang needle biopsies from 7 node, 2 slides, 6 in the cell block  Plans:  The patient will be discharged from the PACU to home when recovered from anesthesia. We will review the cytology, pathology and microbiology results with the patient when they become available. Outpatient followup will be with Garner Nash, DO.   Pulmonary pathology: Adequate for tissue diagnosis concerning for malignancy, station 4R  Garner Nash, DO [facility] 04/12/2019 9:20 AM

## 2019-04-12 NOTE — H&P (View-Only) (Signed)
NAMETrulee Anderson, MRN:  [id number], DOB:  03/05/1956, LOS: 0 ADMISSION DATE:  04/12/2019, CONSULTATION DATE:  04/12/2019 REFERRING MD:  Dr. Joylene Draft, CHIEF COMPLAINT:  Abnormal CT    History of present illness   64 yo, FM, PMH, COPD, current smoker, 3 ppd, >50 years, HTN, HLD, found to have abnormal LDCT. Patient was referred to pulmonary for evaluation and potential biopsy. She has atrial fibrillation was on eliquis and this was stopped several days ago with plans for biopsy. Patient denies hemoptysis. Unfortunately, she is still smoking. Case discussed at bedside in detail. Patient agrees to proceed with video bronchoscopy and endobronchial ultrasound guided transbronchial needle aspirations.   Past Medical History   Past Medical History:  Diagnosis Date  . COPD (chronic obstructive pulmonary disease) (HCC)    emphysema  . Hyperlipidemia   . Hypertension   . Lung nodule   . Paroxysmal atrial flutter ([place])    per cardiology note  . Peripheral artery disease ([place])   . Tobacco abuse   . Vitamin D deficiency   . Wears glasses      Procedures:  EBUS planned    Objective   Blood pressure (!) 92/58, pulse 77, temperature 98 F (36.7 C), temperature source Oral, resp. rate 20, height 5\' 2"  (1.575 m), weight 78.6 kg, SpO2 100 %.       No intake or output data in the 24 hours ending 04/12/19 0722 Filed Weights   04/12/19 0647  Weight: 78.6 kg    Examination: General appearance: 64 y.o., female, NAD, conversant  Eyes: anicteric sclerae, moist conjunctivae; no lid-lag; PERRLA, tracking appropriately HENT: NCAT; oropharynx, MMM Neck: Trachea midline; FROM, supple, lymphadenopathy, no JVD Lungs: CTAB, no crackles, no wheeze CV: RRR, S1, S2 Abdomen: Soft, non-tender; non-distended, BS present  Extremities: No peripheral edema, radial and DP pulses present bilaterally  Skin: Normal temperature, turgor and texture; no rash Psych: Appropriate affect Neuro: Alert and oriented  to person and place, no focal deficit    CT Chest LDCT: IMPRESSION: 1. Lung-RADS 4A, suspicious. Follow up low-dose chest CT without contrast in 3 months (please use the following order, "CT CHEST LCS NODULE FOLLOW-UP W/O CM") is recommended. Alternatively, PET may be considered when there is a solid component 50mm or larger. 2. Enlarged right paratracheal lymph node. In light of the suspicious nodule in the right upper lobe this is concerning for metastatic adenopathy. 3. Chronic interstitial changes with a lower lung zone predominance which may represent early nonspecific interstitial pneumonia versus usual interstitial pneumonia. Consider further evaluation with high-resolution CT of the chest. 4. Aortic Atherosclerosis (ICD10-I70.0) and Emphysema (ICD10-J43.9). 5. Coronary artery calcifications.  Enlarged paratracheal lymphnode, lung nodule  The patient's images have been independently reviewed by me.     Assessment & Plan:   Abnormal Lung cancer screening CT Mediastinal adenopathy  Lung nodule Imaging concerning for primary lung cancer  Tobacco abuse  Current smoker   Plan: Video bronchoscopy with EBUS + TBNA  Discussed risks, benefits and alternatives  We discussed all aspects of invasive diagnostic procedure.  No barriers to proceed Images reviewed with patient at bedside. No anticoagulation on board, was on NOAC but stopped several days ago in preparation  Smoking cessation counseling    Labs   CBC: Recent Labs  Lab 04/12/19 0615  WBC 8.1  HGB 13.4  HCT 40.0  MCV 96.6  PLT 226    Basic Metabolic Panel: No results for input(s): NA, K, CL, CO2, GLUCOSE, BUN, CREATININE,  CALCIUM, MG, PHOS in the last 168 hours. GFR: CrCl cannot be calculated (No successful lab value found.). Recent Labs  Lab 04/12/19 0615  WBC 8.1    Liver Function Tests: No results for input(s): AST, ALT, ALKPHOS, BILITOT, PROT, ALBUMIN in the last 168 hours. No results for  input(s): LIPASE, AMYLASE in the last 168 hours. No results for input(s): AMMONIA in the last 168 hours.  ABG No results found for: PHART, PCO2ART, PO2ART, HCO3, TCO2, ACIDBASEDEF, O2SAT   Coagulation Profile: No results for input(s): INR, PROTIME in the last 168 hours.  Cardiac Enzymes: No results for input(s): CKTOTAL, CKMB, CKMBINDEX, TROPONINI in the last 168 hours.  HbA1C: No results found for: HGBA1C  CBG: No results for input(s): GLUCAP in the last 168 hours.  Review of Systems:   Review of Systems  Constitutional: Negative for chills, fever, malaise/fatigue and weight loss.  HENT: Negative for hearing loss, sore throat and tinnitus.   Eyes: Negative for blurred vision and double vision.  Respiratory: Positive for cough and shortness of breath. Negative for hemoptysis, sputum production, wheezing and stridor.   Cardiovascular: Negative for chest pain, palpitations, orthopnea, leg swelling and PND.  Gastrointestinal: Negative for abdominal pain, constipation, diarrhea, heartburn, nausea and vomiting.  Genitourinary: Negative for dysuria, hematuria and urgency.  Musculoskeletal: Negative for joint pain and myalgias.  Skin: Negative for itching and rash.  Neurological: Negative for dizziness, tingling, weakness and headaches.  Endo/Heme/Allergies: Negative for environmental allergies. Does not bruise/bleed easily.  Psychiatric/Behavioral: Negative for depression. The patient is not nervous/anxious and does not have insomnia.   All other systems reviewed and are negative.    Past Medical History  She,  has a past medical history of COPD (chronic obstructive pulmonary disease) ([place]), Hyperlipidemia, Hypertension, Lung nodule, Paroxysmal atrial flutter ([place]), Peripheral artery disease ([place]), Tobacco abuse, Vitamin D deficiency, and Wears glasses.   Surgical History    Past Surgical History:  Procedure Laterality Date  . OVARIAN CYST REMOVAL    . TONSILLECTOMY    . TUBAL  LIGATION       Social History   reports that she has been smoking cigarettes. She started smoking about 57 years ago. She has a 150.00 pack-year smoking history. She has never used smokeless tobacco. She reports current alcohol use of about 8.0 standard drinks of alcohol per week. She reports that she does not use drugs.   Family History   Her family history includes Anuerysm in her brother; Colon cancer in her sister; Lung cancer in her father and sister; Mesothelioma in her brother; Stroke in her father and mother.   Allergies No Known Allergies   Home Medications  Prior to Admission medications   Medication Sig Start Date End Date Taking? Authorizing Provider  aspirin EC 81 MG tablet Take 1 tablet (81 mg total) by mouth daily. 03/31/19  Yes Patwardhan, Reynold Bowen, MD  Cholecalciferol (DIALYVITE VITAMIN D 5000) 125 MCG (5000 UT) capsule Take 5,000 Units by mouth daily.   Yes [provider]  olmesartan (BENICAR) 20 MG tablet Take 20 mg by mouth every evening.  02/11/19  Yes [provider]  rosuvastatin (CRESTOR) 20 MG tablet Take 20 mg by mouth every evening.  02/11/19  Yes [provider]  vitamin B-12 (CYANOCOBALAMIN) 1000 MCG tablet Take 1,000 mcg by mouth daily.   Yes [provider]    Garner Nash, DO [place] [facility] 04/12/2019 7:22 AM

## 2019-04-12 NOTE — Anesthesia Procedure Notes (Signed)
Procedure Name: LMA Insertion Date/Time: 04/12/2019 7:53 AM Performed by: Renato Shin, CRNA Pre-anesthesia Checklist: Patient identified, Emergency Drugs available, Suction available and Patient being monitored Patient Re-evaluated:Patient Re-evaluated prior to induction Oxygen Delivery Method: Circle system utilized Preoxygenation: Pre-oxygenation with 100% oxygen Induction Type: IV induction LMA: LMA with gastric port inserted LMA Size: 4.0 Number of attempts: 1 Placement Confirmation: positive ETCO2 and breath sounds checked- equal and bilateral Tube secured with: Tape Dental Injury: Teeth and Oropharynx as per pre-operative assessment

## 2019-04-12 NOTE — Progress Notes (Signed)
Clothes sent to PACU, in white patient belonging bag.  Pt stated that she did not bring a cell phone, wallet, purse, or jewelry.

## 2019-04-12 NOTE — Consult Note (Signed)
NAMELeon Goodnow, MRN:  [id number], DOB:  23-Jan-1956, LOS: 0 ADMISSION DATE:  04/12/2019, CONSULTATION DATE:  04/12/2019 REFERRING MD:  Dr. Joylene Draft, CHIEF COMPLAINT:  Abnormal CT    History of present illness   64 yo, FM, PMH, COPD, current smoker, 3 ppd, >50 years, HTN, HLD, found to have abnormal LDCT. Patient was referred to pulmonary for evaluation and potential biopsy. She has atrial fibrillation was on eliquis and this was stopped several days ago with plans for biopsy. Patient denies hemoptysis. Unfortunately, she is still smoking. Case discussed at bedside in detail. Patient agrees to proceed with video bronchoscopy and endobronchial ultrasound guided transbronchial needle aspirations.   Past Medical History   Past Medical History:  Diagnosis Date  . COPD (chronic obstructive pulmonary disease) (HCC)    emphysema  . Hyperlipidemia   . Hypertension   . Lung nodule   . Paroxysmal atrial flutter ([place])    per cardiology note  . Peripheral artery disease ([place])   . Tobacco abuse   . Vitamin D deficiency   . Wears glasses      Procedures:  EBUS planned    Objective   Blood pressure (!) 92/58, pulse 77, temperature 98 F (36.7 C), temperature source Oral, resp. rate 20, height 5\' 2"  (1.575 m), weight 78.6 kg, SpO2 100 %.       No intake or output data in the 24 hours ending 04/12/19 0722 Filed Weights   04/12/19 0647  Weight: 78.6 kg    Examination: General appearance: 64 y.o., female, NAD, conversant  Eyes: anicteric sclerae, moist conjunctivae; no lid-lag; PERRLA, tracking appropriately HENT: NCAT; oropharynx, MMM Neck: Trachea midline; FROM, supple, lymphadenopathy, no JVD Lungs: CTAB, no crackles, no wheeze CV: RRR, S1, S2 Abdomen: Soft, non-tender; non-distended, BS present  Extremities: No peripheral edema, radial and DP pulses present bilaterally  Skin: Normal temperature, turgor and texture; no rash Psych: Appropriate affect Neuro: Alert and oriented  to person and place, no focal deficit    CT Chest LDCT: IMPRESSION: 1. Lung-RADS 4A, suspicious. Follow up low-dose chest CT without contrast in 3 months (please use the following order, "CT CHEST LCS NODULE FOLLOW-UP W/O CM") is recommended. Alternatively, PET may be considered when there is a solid component 4mm or larger. 2. Enlarged right paratracheal lymph node. In light of the suspicious nodule in the right upper lobe this is concerning for metastatic adenopathy. 3. Chronic interstitial changes with a lower lung zone predominance which may represent early nonspecific interstitial pneumonia versus usual interstitial pneumonia. Consider further evaluation with high-resolution CT of the chest. 4. Aortic Atherosclerosis (ICD10-I70.0) and Emphysema (ICD10-J43.9). 5. Coronary artery calcifications.  Enlarged paratracheal lymphnode, lung nodule  The patient's images have been independently reviewed by me.     Assessment & Plan:   Abnormal Lung cancer screening CT Mediastinal adenopathy  Lung nodule Imaging concerning for primary lung cancer  Tobacco abuse  Current smoker   Plan: Video bronchoscopy with EBUS + TBNA  Discussed risks, benefits and alternatives  We discussed all aspects of invasive diagnostic procedure.  No barriers to proceed Images reviewed with patient at bedside. No anticoagulation on board, was on NOAC but stopped several days ago in preparation  Smoking cessation counseling    Labs   CBC: Recent Labs  Lab 04/12/19 0615  WBC 8.1  HGB 13.4  HCT 40.0  MCV 96.6  PLT 676    Basic Metabolic Panel: No results for input(s): NA, K, CL, CO2, GLUCOSE, BUN, CREATININE,  CALCIUM, MG, PHOS in the last 168 hours. GFR: CrCl cannot be calculated (No successful lab value found.). Recent Labs  Lab 04/12/19 0615  WBC 8.1    Liver Function Tests: No results for input(s): AST, ALT, ALKPHOS, BILITOT, PROT, ALBUMIN in the last 168 hours. No results for  input(s): LIPASE, AMYLASE in the last 168 hours. No results for input(s): AMMONIA in the last 168 hours.  ABG No results found for: PHART, PCO2ART, PO2ART, HCO3, TCO2, ACIDBASEDEF, O2SAT   Coagulation Profile: No results for input(s): INR, PROTIME in the last 168 hours.  Cardiac Enzymes: No results for input(s): CKTOTAL, CKMB, CKMBINDEX, TROPONINI in the last 168 hours.  HbA1C: No results found for: HGBA1C  CBG: No results for input(s): GLUCAP in the last 168 hours.  Review of Systems:   Review of Systems  Constitutional: Negative for chills, fever, malaise/fatigue and weight loss.  HENT: Negative for hearing loss, sore throat and tinnitus.   Eyes: Negative for blurred vision and double vision.  Respiratory: Positive for cough and shortness of breath. Negative for hemoptysis, sputum production, wheezing and stridor.   Cardiovascular: Negative for chest pain, palpitations, orthopnea, leg swelling and PND.  Gastrointestinal: Negative for abdominal pain, constipation, diarrhea, heartburn, nausea and vomiting.  Genitourinary: Negative for dysuria, hematuria and urgency.  Musculoskeletal: Negative for joint pain and myalgias.  Skin: Negative for itching and rash.  Neurological: Negative for dizziness, tingling, weakness and headaches.  Endo/Heme/Allergies: Negative for environmental allergies. Does not bruise/bleed easily.  Psychiatric/Behavioral: Negative for depression. The patient is not nervous/anxious and does not have insomnia.   All other systems reviewed and are negative.    Past Medical History  She,  has a past medical history of COPD (chronic obstructive pulmonary disease) ([place]), Hyperlipidemia, Hypertension, Lung nodule, Paroxysmal atrial flutter ([place]), Peripheral artery disease ([place]), Tobacco abuse, Vitamin D deficiency, and Wears glasses.   Surgical History    Past Surgical History:  Procedure Laterality Date  . OVARIAN CYST REMOVAL    . TONSILLECTOMY    . TUBAL  LIGATION       Social History   reports that she has been smoking cigarettes. She started smoking about 57 years ago. She has a 150.00 pack-year smoking history. She has never used smokeless tobacco. She reports current alcohol use of about 8.0 standard drinks of alcohol per week. She reports that she does not use drugs.   Family History   Her family history includes Anuerysm in her brother; Colon cancer in her sister; Lung cancer in her father and sister; Mesothelioma in her brother; Stroke in her father and mother.   Allergies No Known Allergies   Home Medications  Prior to Admission medications   Medication Sig Start Date End Date Taking? Authorizing Provider  aspirin EC 81 MG tablet Take 1 tablet (81 mg total) by mouth daily. 03/31/19  Yes Patwardhan, Reynold Bowen, MD  Cholecalciferol (DIALYVITE VITAMIN D 5000) 125 MCG (5000 UT) capsule Take 5,000 Units by mouth daily.   Yes [provider]  olmesartan (BENICAR) 20 MG tablet Take 20 mg by mouth every evening.  02/11/19  Yes [provider]  rosuvastatin (CRESTOR) 20 MG tablet Take 20 mg by mouth every evening.  02/11/19  Yes [provider]  vitamin B-12 (CYANOCOBALAMIN) 1000 MCG tablet Take 1,000 mcg by mouth daily.   Yes [provider]    Garner Nash, DO [facility] 04/12/2019 7:22 AM

## 2019-04-12 NOTE — Anesthesia Postprocedure Evaluation (Signed)
Anesthesia Post Note  Patient: Multimedia programmer  Procedure(s) Performed: VIDEO BRONCHOSCOPY WITHOUT FLUORO (N/A ) ENDOBRONCHIAL ULTRASOUND (N/A ) BRONCHIAL NEEDLE ASPIRATION BIOPSIES     Patient location during evaluation: PACU Anesthesia Type: General Level of consciousness: awake and alert Pain management: pain level controlled Vital Signs Assessment: post-procedure vital signs reviewed and stable Respiratory status: spontaneous breathing, nonlabored ventilation, respiratory function stable and patient connected to nasal cannula oxygen Cardiovascular status: blood pressure returned to baseline and stable Postop Assessment: no apparent nausea or vomiting Anesthetic complications: no Comments: Borderline hypotension in PACU, albumin administered, pt states she "runs low". She has good mentation and is ambulating without issue. Will discharge to home with monitoring and patient is instructed to hold BP medications for this afternoon.     Last Vitals:  Vitals:   04/12/19 1059 04/12/19 1108  BP: (!) 63/51 (!) 74/54  Pulse: 88 83  Resp: 14 14  Temp:    SpO2: 95% 95%    Last Pain:  Vitals:   04/12/19 1115  TempSrc:   PainSc: 0-No pain                 Effie Berkshire

## 2019-04-12 NOTE — Transfer of Care (Signed)
Immediate Anesthesia Transfer of Care Note  Patient: Betty Anderson  Procedure(s) Performed: VIDEO BRONCHOSCOPY WITHOUT FLUORO (N/A ) ENDOBRONCHIAL ULTRASOUND (N/A ) BRONCHIAL NEEDLE ASPIRATION BIOPSIES  Patient Location: PACU  Anesthesia Type:General  Level of Consciousness: awake, alert , oriented and patient cooperative  Airway & Oxygen Therapy: Patient Spontanous Breathing and Patient connected to face mask oxygen  Post-op Assessment: Report given to RN and Post -op Vital signs reviewed and unstable, Anesthesiologist notified  Post vital signs: Reviewed and stable  Last Vitals:  Vitals Value Taken Time  BP 107/58 04/12/19 0938  Temp    Pulse 94 04/12/19 0938  Resp 17 04/12/19 0938  SpO2 97 % 04/12/19 0938  Vitals shown include unvalidated device data.  Last Pain:  Vitals:   04/12/19 0921  TempSrc:   PainSc: 0-No pain         Complications: No apparent anesthesia complications

## 2019-04-13 ENCOUNTER — Telehealth: Payer: Self-pay | Admitting: Pulmonary Disease

## 2019-04-13 ENCOUNTER — Encounter: Payer: Self-pay | Admitting: *Deleted

## 2019-04-13 ENCOUNTER — Telehealth: Payer: Self-pay | Admitting: *Deleted

## 2019-04-13 DIAGNOSIS — R590 Localized enlarged lymph nodes: Secondary | ICD-10-CM

## 2019-04-13 LAB — CYTOLOGY - NON PAP

## 2019-04-13 NOTE — Telephone Encounter (Signed)
Oncology Nurse Navigator Documentation  Oncology Nurse Navigator Flowsheets 04/13/2019  Navigator Location [facility]  Referral Date to RadOnc/MedOnc 04/12/2019  Navigator Encounter Type Telephone/I received referral yesterday from Dr. Valeta Harms. I called and scheduled patient to be seen at [facility] next week.  She verbalized understanding of appt time and place.   Telephone Outgoing Call  Treatment Phase Pre-Tx/Tx Discussion  Barriers/Navigation Needs Coordination of Care;Education  Education Other  Interventions Coordination of Care;Education  Acuity Level 2-Minimal Needs (1-2 Barriers Identified)  Coordination of Care Appts  Education Method Verbal  Time Spent with Patient 30

## 2019-04-13 NOTE — Telephone Encounter (Signed)
Spoke with pt and she reported that her blood pressure is running really low and wanted to know if she should take her medication today. She has a PET scan tomorrow and I advised her to call Dr. Joylene Draft to let him know since he wrote the Rx. She agreed and nothing further is needed.

## 2019-04-13 NOTE — Progress Notes (Signed)
Post call completed for procedure in Endo on 1/12.  Pt to get PET scan 1/14 and she stated BP "all over the place."  Pt plan to touch base with Dr. Windell Norfolk today about PET scan and BP.  Also spoke with pt about contacting provider that prescribed antihypertensives.  Pt verbalized understanding.

## 2019-04-14 ENCOUNTER — Encounter (HOSPITAL_COMMUNITY)
Admission: RE | Admit: 2019-04-14 | Discharge: 2019-04-14 | Disposition: A | Discharge status: Home / Self Care | Payer: Medicare Other | Source: Ambulatory Visit | Attending: Pulmonary Disease | Admitting: Pulmonary Disease

## 2019-04-14 ENCOUNTER — Other Ambulatory Visit: Payer: Self-pay

## 2019-04-14 DIAGNOSIS — I700 Atherosclerosis of aorta: Secondary | ICD-10-CM | POA: Diagnosis not present

## 2019-04-14 DIAGNOSIS — I2510 Atherosclerotic heart disease of native coronary artery without angina pectoris: Secondary | ICD-10-CM | POA: Diagnosis not present

## 2019-04-14 DIAGNOSIS — J439 Emphysema, unspecified: Secondary | ICD-10-CM | POA: Insufficient documentation

## 2019-04-14 DIAGNOSIS — C3490 Malignant neoplasm of unspecified part of unspecified bronchus or lung: Secondary | ICD-10-CM | POA: Diagnosis not present

## 2019-04-14 LAB — GLUCOSE, CAPILLARY: Glucose-Capillary: 73 mg/dL (ref 70–99)

## 2019-04-14 MED ORDER — FLUDEOXYGLUCOSE F - 18 (FDG) INJECTION
9.2000 | Freq: Once | INTRAVENOUS | Status: AC
  Administered 2019-04-14: 9.2 via INTRAVENOUS

## 2019-04-18 ENCOUNTER — Encounter: Payer: Self-pay | Admitting: *Deleted

## 2019-04-21 ENCOUNTER — Other Ambulatory Visit: Payer: Self-pay | Admitting: *Deleted

## 2019-04-21 ENCOUNTER — Other Ambulatory Visit: Payer: Self-pay | Admitting: Medical Oncology

## 2019-04-21 ENCOUNTER — Other Ambulatory Visit: Payer: Self-pay

## 2019-04-21 ENCOUNTER — Inpatient Hospital Stay: Payer: Medicare Other | Attending: Internal Medicine

## 2019-04-21 ENCOUNTER — Encounter: Payer: Self-pay | Admitting: Internal Medicine

## 2019-04-21 ENCOUNTER — Ambulatory Visit
Admission: RE | Admit: 2019-04-21 | Discharge: 2019-04-21 | Disposition: A | Discharge status: Home / Self Care | Payer: Medicare Other | Source: Ambulatory Visit | Attending: Radiation Oncology | Admitting: Radiation Oncology

## 2019-04-21 ENCOUNTER — Encounter: Payer: Self-pay | Admitting: *Deleted

## 2019-04-21 ENCOUNTER — Inpatient Hospital Stay (HOSPITAL_BASED_OUTPATIENT_CLINIC_OR_DEPARTMENT_OTHER): Payer: Medicare Other | Admitting: Internal Medicine

## 2019-04-21 VITALS — BP 118/71 | HR 89 | Temp 98.2°F | Resp 17 | Ht 62.0 in | Wt 170.2 lb

## 2019-04-21 DIAGNOSIS — Z79899 Other long term (current) drug therapy: Secondary | ICD-10-CM

## 2019-04-21 DIAGNOSIS — I700 Atherosclerosis of aorta: Secondary | ICD-10-CM | POA: Insufficient documentation

## 2019-04-21 DIAGNOSIS — Z808 Family history of malignant neoplasm of other organs or systems: Secondary | ICD-10-CM

## 2019-04-21 DIAGNOSIS — I739 Peripheral vascular disease, unspecified: Secondary | ICD-10-CM | POA: Insufficient documentation

## 2019-04-21 DIAGNOSIS — R5383 Other fatigue: Secondary | ICD-10-CM | POA: Diagnosis not present

## 2019-04-21 DIAGNOSIS — Z800 Family history of malignant neoplasm of digestive organs: Secondary | ICD-10-CM | POA: Diagnosis not present

## 2019-04-21 DIAGNOSIS — J479 Bronchiectasis, uncomplicated: Secondary | ICD-10-CM | POA: Insufficient documentation

## 2019-04-21 DIAGNOSIS — C3411 Malignant neoplasm of upper lobe, right bronchus or lung: Secondary | ICD-10-CM

## 2019-04-21 DIAGNOSIS — F17210 Nicotine dependence, cigarettes, uncomplicated: Secondary | ICD-10-CM

## 2019-04-21 DIAGNOSIS — Z823 Family history of stroke: Secondary | ICD-10-CM | POA: Insufficient documentation

## 2019-04-21 DIAGNOSIS — Z7289 Other problems related to lifestyle: Secondary | ICD-10-CM | POA: Insufficient documentation

## 2019-04-21 DIAGNOSIS — I4892 Unspecified atrial flutter: Secondary | ICD-10-CM | POA: Insufficient documentation

## 2019-04-21 DIAGNOSIS — R0609 Other forms of dyspnea: Secondary | ICD-10-CM | POA: Insufficient documentation

## 2019-04-21 DIAGNOSIS — E785 Hyperlipidemia, unspecified: Secondary | ICD-10-CM | POA: Diagnosis not present

## 2019-04-21 DIAGNOSIS — R531 Weakness: Secondary | ICD-10-CM | POA: Diagnosis not present

## 2019-04-21 DIAGNOSIS — R590 Localized enlarged lymph nodes: Secondary | ICD-10-CM | POA: Diagnosis not present

## 2019-04-21 DIAGNOSIS — I2510 Atherosclerotic heart disease of native coronary artery without angina pectoris: Secondary | ICD-10-CM | POA: Insufficient documentation

## 2019-04-21 DIAGNOSIS — J439 Emphysema, unspecified: Secondary | ICD-10-CM | POA: Diagnosis not present

## 2019-04-21 DIAGNOSIS — Z801 Family history of malignant neoplasm of trachea, bronchus and lung: Secondary | ICD-10-CM | POA: Insufficient documentation

## 2019-04-21 DIAGNOSIS — E559 Vitamin D deficiency, unspecified: Secondary | ICD-10-CM | POA: Diagnosis not present

## 2019-04-21 DIAGNOSIS — J449 Chronic obstructive pulmonary disease, unspecified: Secondary | ICD-10-CM | POA: Diagnosis not present

## 2019-04-21 DIAGNOSIS — R519 Headache, unspecified: Secondary | ICD-10-CM | POA: Diagnosis not present

## 2019-04-21 DIAGNOSIS — Z8249 Family history of ischemic heart disease and other diseases of the circulatory system: Secondary | ICD-10-CM | POA: Insufficient documentation

## 2019-04-21 DIAGNOSIS — N281 Cyst of kidney, acquired: Secondary | ICD-10-CM | POA: Diagnosis not present

## 2019-04-21 DIAGNOSIS — I10 Essential (primary) hypertension: Secondary | ICD-10-CM | POA: Diagnosis not present

## 2019-04-21 DIAGNOSIS — R634 Abnormal weight loss: Secondary | ICD-10-CM

## 2019-04-21 DIAGNOSIS — Z7189 Other specified counseling: Secondary | ICD-10-CM | POA: Insufficient documentation

## 2019-04-21 DIAGNOSIS — Z5111 Encounter for antineoplastic chemotherapy: Secondary | ICD-10-CM | POA: Insufficient documentation

## 2019-04-21 DIAGNOSIS — F17200 Nicotine dependence, unspecified, uncomplicated: Secondary | ICD-10-CM

## 2019-04-21 LAB — CBC WITH DIFFERENTIAL (CANCER CENTER ONLY)
Abs Immature Granulocytes: 0.02 10*3/uL (ref 0.00–0.07)
Basophils Absolute: 0.1 10*3/uL (ref 0.0–0.1)
Basophils Relative: 1 %
Eosinophils Absolute: 0.2 10*3/uL (ref 0.0–0.5)
Eosinophils Relative: 2 %
HCT: 34 % — ABNORMAL LOW (ref 36.0–46.0)
Hemoglobin: 11.4 g/dL — ABNORMAL LOW (ref 12.0–15.0)
Immature Granulocytes: 0 %
Lymphocytes Relative: 29 %
Lymphs Abs: 2.7 10*3/uL (ref 0.7–4.0)
MCH: 32.5 pg (ref 26.0–34.0)
MCHC: 33.5 g/dL (ref 30.0–36.0)
MCV: 96.9 fL (ref 80.0–100.0)
Monocytes Absolute: 0.7 10*3/uL (ref 0.1–1.0)
Monocytes Relative: 7 %
Neutro Abs: 5.6 10*3/uL (ref 1.7–7.7)
Neutrophils Relative %: 61 %
Platelet Count: 436 10*3/uL — ABNORMAL HIGH (ref 150–400)
RBC: 3.51 MIL/uL — ABNORMAL LOW (ref 3.87–5.11)
RDW: 13.7 % (ref 11.5–15.5)
WBC Count: 9.2 10*3/uL (ref 4.0–10.5)
nRBC: 0 % (ref 0.0–0.2)

## 2019-04-21 LAB — CMP (CANCER CENTER ONLY)
ALT: 8 U/L (ref 0–44)
AST: 13 U/L — ABNORMAL LOW (ref 15–41)
Albumin: 3.6 g/dL (ref 3.5–5.0)
Alkaline Phosphatase: 115 U/L (ref 38–126)
Anion gap: 10 (ref 5–15)
BUN: 16 mg/dL (ref 8–23)
CO2: 22 mmol/L (ref 22–32)
Calcium: 9.4 mg/dL (ref 8.9–10.3)
Chloride: 104 mmol/L (ref 98–111)
Creatinine: 1.07 mg/dL — ABNORMAL HIGH (ref 0.44–1.00)
GFR, Est AFR Am: >60 mL/min (ref >60)
GFR, Estimated: 54 mL/min — ABNORMAL LOW (ref >60)
Glucose, Bld: 72 mg/dL (ref 70–99)
Potassium: 3.9 mmol/L (ref 3.5–5.1)
Sodium: 136 mmol/L (ref 135–145)
Total Bilirubin: 0.4 mg/dL (ref 0.3–1.2)
Total Protein: 7.9 g/dL (ref 6.5–8.1)

## 2019-04-21 MED ORDER — PROCHLORPERAZINE MALEATE 10 MG PO TABS: 10.0000 mg | ORAL_TABLET | Freq: Four times a day (QID) | ORAL | 0 refills | Status: DC | PRN

## 2019-04-21 NOTE — Progress Notes (Signed)
[facility] [phone]   [phone] Multidisciplinary thoracic oncology clinic  CONSULT NOTE  REFERRING PHYSICIAN: Dr. Leory Plowman Icard  REASON FOR CONSULTATION:  64 years old white female recently diagnosed with lung cancer.  HPI Betty [facility] is a 64 y.o. female with past medical history significant for COPD, hypertension, dyslipidemia, paroxysmal atrial flutter, peripheral arterial disease as well as vitamin D deficiency and long history of smoking.  The patient was seen by her primary care physician Dr. Haynes Kerns in December 2020 for routine annual examination and because of her long smoking history he order CT screening of the chest which was performed on March 30, 2019 and it showed a solid spiculated nodule measuring 1.4 cm within the right upper lobe.  Additional smaller solid and nonsolid nodules were identified within the remaining portion of the lungs.  There was also right paratracheal lymph node measuring 1.9 cm.  The patient was referred to Dr. Valeta Harms and on April 12, 2019 she underwent bronchoscopy with endobronchial ultrasound and biopsy of the 4R lymph node ([id number]) showed malignant cells consistent with non-small cell carcinoma.  The patient had a PET scan on April 14, 2019 and that showed the spiculated 1.4 x 1.3 cm right upper lobe pulmonary nodule with SUV max of 13.4.  There was also right paratracheal lymph node measuring 1.9 cm in short axis with maximum SUV of 19.3 as well as a right hilar lymph node measuring 0.7 cm with maximum SUV of 8.7.  The scan also showed subcarinal node measuring 1.1 cm with maximum SUV of 6.3. Dr. Valeta Harms kindly referred the patient to me today for evaluation and recommendation regarding treatment of her condition. When seen today the patient continues to complain of increasing fatigue and weakness as well as shortness of breath with exertion.  She denied having any chest pain, cough or hemoptysis.   She denied having any nausea, vomiting, diarrhea or constipation.  She lost around 9 pounds in the last 2 months.  She has intermittent headache but no visual changes. Family history significant for mother and father with stroke.  Her father as well as a sister had lung cancer.  Another sister had colon cancer and brother had mesothelioma. The patient is married and has 3 biologic children and 2 stepchildren.  She was accompanied today by her husband Clair Gulling.  She used to work as a Arboriculturist for [employer].  She has a history for smoking up to 3 packs a day for 50 years.  The patient still smoking around 1 pack/day.  She also drinks alcohol 6 to 8 ounces every day of [place].  She has no history of drug abuse. HPI  Past Medical History:  Diagnosis Date   COPD (chronic obstructive pulmonary disease) ([place])    emphysema   Hyperlipidemia    Hypertension    Lung nodule    Paroxysmal atrial flutter ([place])    per cardiology note   Peripheral artery disease ([place])    Tobacco abuse    Vitamin D deficiency    Wears glasses     Past Surgical History:  Procedure Laterality Date   BRONCHIAL NEEDLE ASPIRATION BIOPSY  04/12/2019   Procedure: BRONCHIAL NEEDLE ASPIRATION BIOPSIES;  Surgeon: Garner Nash, DO;  Location: [facility];  Service: Cardiopulmonary;;   ENDOBRONCHIAL ULTRASOUND N/A 04/12/2019   Procedure: ENDOBRONCHIAL ULTRASOUND;  Surgeon: Garner Nash, DO;  Location: [facility];  Service: Cardiopulmonary;  Laterality: N/A;   OVARIAN CYST REMOVAL  TONSILLECTOMY     TUBAL LIGATION     VIDEO BRONCHOSCOPY N/A 04/12/2019   Procedure: VIDEO BRONCHOSCOPY WITHOUT FLUORO;  Surgeon: Garner Nash, DO;  Location: [facility];  Service: Cardiopulmonary;  Laterality: N/A;    Family History  Problem Relation Age of Onset   Stroke Mother    Stroke Father    Lung cancer Father    Mesothelioma Brother    Lung cancer Sister    Colon cancer Sister     Anuerysm Brother     Social History Social History   Tobacco Use   Smoking status: Current Every Day Smoker    Packs/day: 3.00    Years: 50.00    Pack years: 150.00    Types: Cigarettes    Start date: 03/31/1962   Smokeless tobacco: Never Used   Tobacco comment: Pt currently smoking 3ppd x years.   Substance Use Topics   Alcohol use: Yes    Alcohol/week: 8.0 standard drinks    Types: 8 Shots of liquor per week    Comment: " Mixed drinks daily"   Drug use: Never    No Known Allergies  Current Outpatient Medications  Medication Sig Dispense Refill   aspirin EC 81 MG tablet Take 1 tablet (81 mg total) by mouth daily. 90 tablet 3   Cholecalciferol (DIALYVITE VITAMIN D 5000) 125 MCG (5000 UT) capsule Take 5,000 Units by mouth daily.     escitalopram (LEXAPRO) 10 MG tablet Take 10 mg by mouth daily.     olmesartan (BENICAR) 20 MG tablet Take 20 mg by mouth every evening.      rosuvastatin (CRESTOR) 20 MG tablet Take 20 mg by mouth every evening.      vitamin B-12 (CYANOCOBALAMIN) 1000 MCG tablet Take 1,000 mcg by mouth daily.     No current facility-administered medications for this visit.    Review of Systems  Constitutional: positive for fatigue and weight loss Eyes: negative Ears, nose, mouth, throat, and face: negative Respiratory: positive for dyspnea on exertion Cardiovascular: negative Gastrointestinal: negative Genitourinary:negative Integument/breast: negative Hematologic/lymphatic: negative Musculoskeletal:negative Neurological: negative Behavioral/Psych: negative Endocrine: negative Allergic/Immunologic: negative  Physical Exam  FIE:PPIRJ, healthy, no distress, well nourished and well developed SKIN: skin color, texture, turgor are normal, no rashes or significant lesions HEAD: Normocephalic, No masses, lesions, tenderness or abnormalities EYES: normal, PERRLA, Conjunctiva are pink and non-injected EARS: External ears normal, Canals  clear OROPHARYNX:no exudate, no erythema and lips, buccal mucosa, and tongue normal  NECK: supple, no adenopathy, no JVD LYMPH:  no palpable lymphadenopathy, no hepatosplenomegaly BREAST:not examined LUNGS: clear to auscultation , and palpation HEART: regular rate & rhythm, no murmurs and no gallops ABDOMEN:abdomen soft, non-tender, normal bowel sounds and no masses or organomegaly BACK: No CVA tenderness, Range of motion is normal EXTREMITIES:no joint deformities, effusion, or inflammation, no edema  NEURO: alert & oriented x 3 with fluent speech, no focal motor/sensory deficits  PERFORMANCE STATUS: ECOG 1  LABORATORY DATA: Lab Results  Component Value Date   WBC 9.2 04/21/2019   HGB 11.4 (L) 04/21/2019   HCT 34.0 (L) 04/21/2019   MCV 96.9 04/21/2019   PLT 436 (H) 04/21/2019      Chemistry      Component Value Date/Time   NA 135 04/12/2019 0615   K 4.5 04/12/2019 0615   CL 103 04/12/2019 0615   CO2 24 04/12/2019 0615   BUN 14 04/12/2019 0615   CREATININE 1.33 (H) 04/12/2019 0615      Component Value Date/Time  CALCIUM 9.5 04/12/2019 0615   ALKPHOS 126 04/12/2019 0615   AST 19 04/12/2019 0615   ALT 17 04/12/2019 0615   BILITOT 0.5 04/12/2019 0615       RADIOGRAPHIC STUDIES: NM PET - Initial Skull Base To Thigh  Result Date: 04/14/2019 CLINICAL DATA:  Initial treatment strategy for non-small cell lung cancer. EXAM: NUCLEAR MEDICINE PET SKULL BASE TO THIGH TECHNIQUE: 9.2 mCi F-18 FDG was injected intravenously. Full-ring PET imaging was performed from the skull base to thigh after the radiotracer. CT data was obtained and used for attenuation correction and anatomic localization. Fasting blood glucose: 73 mg/dl COMPARISON:  CT chest 03/30/2019 FINDINGS: Mediastinal blood pool activity: SUV max 2.5 Liver activity: SUV max NA NECK: No significant abnormal hypermetabolic activity in this region. Incidental CT findings: none CHEST: The spiculated 1.4 by 1.3 cm right upper  lobe pulmonary nodule on image 17/8 has a maximum SUV of 13.4. The right paratracheal lymph node measuring 1.9 cm in short axis on image 61/4 has maximum SUV of 19.3. A right hilar lymph node measuring approximately 0.7 cm in short axis on image 65/4 has a maximum SUV of 8.7. Subcarinal node measuring approximately 1.1 cm in short axis on image 68/4 has a maximum SUV of 6.3. This is difficult separate from the esophagus and some of this activity could be esophageal, but adenopathy along the anterior margin of the esophagus is favored as the likely cause. Speckled accentuated activity in the left hilum with maximum SUV up to 3.8, significance uncertain. No obvious pathologic adenopathy in this region on the CT data. Incidental CT findings: Emphysema. Stable peripheral interstitial reticulation with patchy ground-glass attenuation. Coronary, aortic arch, and branch vessel atherosclerotic vascular disease. ABDOMEN/PELVIS: No significant abnormal hypermetabolic activity in this region. Incidental CT findings: Aortoiliac atherosclerotic vascular disease. Enlarged multilobular uterus likely representing uterine fibroids, without a significant degree of hypermetabolic activity to suggest a malignant process. The uterus measures 12.4 by 6.5 by 10.1 cm (volume = 430 cm^3). Trace free pelvic fluid. SKELETON: No significant abnormal hypermetabolic activity in this region. Incidental CT findings: Mild dextroconvex lumbar scoliosis. IMPRESSION: 1. Hypermetabolic right upper lobe lung nodule with hypermetabolic right paratracheal, right hilar, and subcarinal adenopathy, compatible with active malignancy. There is some speckled accentuated activity in the left hilum but not enough to definitively diagnose malignant adenopathy on the left at this point. Accordingly this is considered T1b N2 M0 disease (stage IIIA). 2. Other imaging findings of potential clinical significance: Aortic Atherosclerosis (ICD10-I70.0) and Emphysema  (ICD10-J43.9). Coronary atherosclerosis. Stable interstitial reticulation and patchy ground-glass attenuation in the lungs. Large multilobular uterus, likely from fibroids. Trace free pelvic fluid. Electronically Signed   By: Van Clines M.D.   On: 04/14/2019 14:57   CT CHEST LUNG CA SCREEN LOW DOSE W/O CM  Result Date: 03/30/2019 CLINICAL DATA:  Tobacco abuse. One hundred fifty pack-year history. Current asymptomatic smoker. EXAM: CT CHEST WITHOUT CONTRAST LOW-DOSE FOR LUNG CANCER SCREENING TECHNIQUE: Multidetector CT imaging of the chest was performed following the standard protocol without IV contrast. COMPARISON:  None FINDINGS: Cardiovascular: The heart size appears normal. Aortic atherosclerosis. Lad, left circumflex coronary artery atherosclerotic calcifications identified. Mediastinum/Nodes: Normal appearance of the thyroid gland. The trachea appears patent and is midline. Normal appearance of the esophagus. No enlarged axillary or supraclavicular lymph nodes. Right paratracheal lymph node measures 1.9 cm, image 20/2. Lungs/Pleura: Mild changes of paraseptal and centrilobular emphysema. Bilateral, lower lung zone predominant interstitial reticulation with subpleural banding, patchy ground-glass attenuation, and mild  traction bronchiectasis noted. No airspace consolidation, atelectasis, or pneumothorax. Within the right upper lobe there is a solid spiculated nodule with an equivalent diameter of 13.8 mm. Additional smaller solid and non solid nodules are identified within the remaining portions of the lungs. Upper Abdomen: No acute abnormality. Cyst within upper pole of the left kidney measures 1.7 cm. Musculoskeletal: No chest wall mass or suspicious bone lesions identified. IMPRESSION: 1. Lung-RADS 4A, suspicious. Follow up low-dose chest CT without contrast in 3 months (please use the following order, "CT CHEST LCS NODULE FOLLOW-UP W/O CM") is recommended. Alternatively, PET may be considered  when there is a solid component 21mm or larger. 2. Enlarged right paratracheal lymph node. In light of the suspicious nodule in the right upper lobe this is concerning for metastatic adenopathy. 3. Chronic interstitial changes with a lower lung zone predominance which may represent early nonspecific interstitial pneumonia versus usual interstitial pneumonia. Consider further evaluation with high-resolution CT of the chest. 4. Aortic Atherosclerosis (ICD10-I70.0) and Emphysema (ICD10-J43.9). 5. Coronary artery calcifications. Electronically Signed   By: Kerby Moors M.D.   On: 03/30/2019 11:24    ASSESSMENT: This is a very pleasant 64 years old white female recently diagnosed with 31( T1b, N2, M0) non-small cell lung cancer presented with right upper lobe lung nodule in addition to mediastinal lymphadenopathy diagnosed in January 2021.   PLAN: I had a lengthy discussion with the patient and her husband today about her current disease stage, prognosis and treatment options. I personally and independently reviewed the scan images and discussed the results with the patient and her husband today. I recommended for the patient to complete the staging work-up by having the MRI that was ordered by Dr. Valeta Harms performed next week. I discussed with the patient her treatment options and recommended for her regimen consisting of concurrent chemoradiation with weekly carboplatin for AUC of 2 and paclitaxel 45 mg/M2 for around 6-7 weeks.  If the patient has no evidence for disease progression she would be consider for consolidation treatment with immunotherapy with Imfinzi. I discussed with the patient the adverse effect of the chemotherapy including but not limited to alopecia, myelosuppression, nausea and vomiting, peripheral neuropathy, liver or renal dysfunction. She is expected to start the first cycle of this treatment on May 02, 2019.  The patient will see Dr. Lisbeth Renshaw today for evaluation and discussion of the  radiotherapy option. I will arrange for the patient to have a chemotherapy education class before the first cycle of her treatment. I will send prescription for Compazine 10 mg p.o. every 6 hours as needed for nausea to her pharmacy. The patient will come back for follow-up visit 1 week after the first cycle of her treatment and then every 2 weeks for management of any adverse effect of her treatment. She was advised to call immediately if she has any concerning symptoms in the interval.  The patient voices understanding of current disease status and treatment options and is in agreement with the current care plan.  All questions were answered. The patient knows to call the clinic with any problems, questions or concerns. We can certainly see the patient much sooner if necessary.  Thank you so much for allowing me to participate in the care of [place]. I will continue to follow up the patient with you and assist in her care.  The total time spent in the appointment was 90 minutes.  Disclaimer: This note was dictated with voice recognition software. Similar sounding words can inadvertently  be transcribed and may not be corrected upon review.   Eilleen Kempf April 21, 2019, 2:18 PM

## 2019-04-21 NOTE — Progress Notes (Signed)
The proposed treatment discussed in cancer conference is for discussion purpose only and is not a binding recommendation. The patient was not physically examined nor present for their treatment options. Therefore, final treatment plans cannot be decided.  ?

## 2019-04-21 NOTE — Progress Notes (Signed)
Radiation Oncology         [phone]) [phone] ________________________________  Name: Betty Anderson        MRN: [id number]  Date of Service: 04/21/2019 DOB: 1956-01-19  QM:[id number], Elta Guadeloupe, MD  Garner Nash, DO     REFERRING PHYSICIAN: Garner Nash, DO   DIAGNOSIS: The encounter diagnosis was Malignant neoplasm of upper lobe of right lung ([place]).   HISTORY OF PRESENT ILLNESS: Betty Anderson is a 64 y.o. female seen at the request of Dr. Valeta Harms for a newly diagnosed lung cancer. The patient was being evaluated and given her tobacco history, was encouraged to proceed with a lung cancer screening CT on 03/30/2019 which revealed a spiculated right upper lobe mass measuring 13.8 mm, and an associated right paratracheal node measuring 1.9 cm. A PET scan on 04/14/19 revealed hypermetabolism of the RUL nodule, paratracheal, right hilar, and subcarinal nodal stations. She underwent a video bronchoscopy on 04/12/19 with Dr. Valeta Harms which revealed no disease in the level 7 node, and malignancy in the 4R node consistent with a NSCLC. Her tissue volume was scant and pathology cannot further classify her tumor type. An MRI of the brain has been ordered but she's waiting for this to get scheduled. She's seen today in multidisciplinary thoracic oncology conference to discuss next steps of treating her cancer.    PREVIOUS RADIATION THERAPY: No   PAST MEDICAL HISTORY:  Past Medical History:  Diagnosis Date   COPD (chronic obstructive pulmonary disease) ([place])    emphysema   Hyperlipidemia    Hypertension    Lung nodule    Paroxysmal atrial flutter ([place])    per cardiology note   Peripheral artery disease ([place])    Tobacco abuse    Vitamin D deficiency    Wears glasses        PAST SURGICAL HISTORY: Past Surgical History:  Procedure Laterality Date   BRONCHIAL NEEDLE ASPIRATION BIOPSY  04/12/2019   Procedure: BRONCHIAL NEEDLE ASPIRATION BIOPSIES;  Surgeon: Garner Nash, DO;   Location: [facility];  Service: Cardiopulmonary;;   ENDOBRONCHIAL ULTRASOUND N/A 04/12/2019   Procedure: ENDOBRONCHIAL ULTRASOUND;  Surgeon: Garner Nash, DO;  Location: [facility];  Service: Cardiopulmonary;  Laterality: N/A;   OVARIAN CYST REMOVAL     TONSILLECTOMY     TUBAL LIGATION     VIDEO BRONCHOSCOPY N/A 04/12/2019   Procedure: VIDEO BRONCHOSCOPY WITHOUT FLUORO;  Surgeon: Garner Nash, DO;  Location: [facility];  Service: Cardiopulmonary;  Laterality: N/A;     FAMILY HISTORY:  Family History  Problem Relation Age of Onset   Stroke Mother    Stroke Father    Lung cancer Father    Mesothelioma Brother    Lung cancer Sister    Colon cancer Sister    Anuerysm Brother      SOCIAL HISTORY:  reports that she has been smoking cigarettes. She started smoking about 57 years ago. She has a 150.00 pack-year smoking history. She has never used smokeless tobacco. She reports current alcohol use of about 8.0 standard drinks of alcohol per week. She reports that she does not use drugs. The patient is married and lives in [place]. She is accompanied by her husband.   ALLERGIES: Patient has no known allergies.   MEDICATIONS:  Current Outpatient Medications  Medication Sig Dispense Refill   aspirin EC 81 MG tablet Take 1 tablet (81 mg total) by mouth daily. 90 tablet 3   Cholecalciferol (DIALYVITE VITAMIN D 5000) 125 MCG (5000 UT) capsule  Take 5,000 Units by mouth daily.     olmesartan (BENICAR) 20 MG tablet Take 20 mg by mouth every evening.      rosuvastatin (CRESTOR) 20 MG tablet Take 20 mg by mouth every evening.      vitamin B-12 (CYANOCOBALAMIN) 1000 MCG tablet Take 1,000 mcg by mouth daily.     No current facility-administered medications for this encounter.     REVIEW OF SYSTEMS: On review of systems, the patient reports that she is doing well overall. She denies any chest pain. She has shortness of breath on exertion and a wet sometimes  productive cough without hemoptysis. She denies fevers, chills, night sweats, unintended weight changes. She denies any bowel or bladder disturbances, and denies abdominal pain, nausea or vomiting. She denies any new musculoskeletal or joint aches or pains. A complete review of systems is obtained and is otherwise negative.     PHYSICAL EXAM:  Wt Readings from Last 3 Encounters:  04/12/19 173 lb 4.8 oz (78.6 kg)  03/31/19 173 lb 4.8 oz (78.6 kg)   Temp Readings from Last 3 Encounters:  04/12/19 (!) 97 F (36.1 C)  03/31/19 (!) 96.1 F (35.6 C) (Oral)  02/22/19 98.3 F (36.8 C) (Oral)   BP Readings from Last 3 Encounters:  04/12/19 (!) 74/54  03/31/19 106/63  02/22/19 126/60   Pulse Readings from Last 3 Encounters:  04/12/19 83  03/31/19 83  02/22/19 90   In general this is a well appearing caucasian female female in no acute distress. She's alert and oriented x4 and appropriate throughout the examination. Cardiopulmonary assessment is negative for acute distress and she exhibits normal effort.    ECOG = 1  0 - Asymptomatic (Fully active, able to carry on all predisease activities without restriction)  1 - Symptomatic but completely ambulatory (Restricted in physically strenuous activity but ambulatory and able to carry out work of a light or sedentary nature. For example, light housework, office work)  2 - Symptomatic, <50% in bed during the day (Ambulatory and capable of all self care but unable to carry out any work activities. Up and about more than 50% of waking hours)  3 - Symptomatic, >50% in bed, but not bedbound (Capable of only limited self-care, confined to bed or chair 50% or more of waking hours)  4 - Bedbound (Completely disabled. Cannot carry on any self-care. Totally confined to bed or chair)  5 - Death   Eustace Pen MM, Creech RH, Tormey DC, et al. [id number]). "Toxicity and response criteria of the [facility]". [employer] Oncol. 5 (6):  649-55    LABORATORY DATA:  Lab Results  Component Value Date   WBC 8.1 04/12/2019   HGB 13.4 04/12/2019   HCT 40.0 04/12/2019   MCV 96.6 04/12/2019   PLT 383 04/12/2019   Lab Results  Component Value Date   NA 135 04/12/2019   K 4.5 04/12/2019   CL 103 04/12/2019   CO2 24 04/12/2019   Lab Results  Component Value Date   ALT 17 04/12/2019   AST 19 04/12/2019   ALKPHOS 126 04/12/2019   BILITOT 0.5 04/12/2019      RADIOGRAPHY: NM PET - Initial Skull Base To Thigh  Result Date: 04/14/2019 CLINICAL DATA:  Initial treatment strategy for non-small cell lung cancer. EXAM: NUCLEAR MEDICINE PET SKULL BASE TO THIGH TECHNIQUE: 9.2 mCi F-18 FDG was injected intravenously. Full-ring PET imaging was performed from the skull base to thigh after the radiotracer. CT  data was obtained and used for attenuation correction and anatomic localization. Fasting blood glucose: 73 mg/dl COMPARISON:  CT chest 03/30/2019 FINDINGS: Mediastinal blood pool activity: SUV max 2.5 Liver activity: SUV max NA NECK: No significant abnormal hypermetabolic activity in this region. Incidental CT findings: none CHEST: The spiculated 1.4 by 1.3 cm right upper lobe pulmonary nodule on image 17/8 has a maximum SUV of 13.4. The right paratracheal lymph node measuring 1.9 cm in short axis on image 61/4 has maximum SUV of 19.3. A right hilar lymph node measuring approximately 0.7 cm in short axis on image 65/4 has a maximum SUV of 8.7. Subcarinal node measuring approximately 1.1 cm in short axis on image 68/4 has a maximum SUV of 6.3. This is difficult separate from the esophagus and some of this activity could be esophageal, but adenopathy along the anterior margin of the esophagus is favored as the likely cause. Speckled accentuated activity in the left hilum with maximum SUV up to 3.8, significance uncertain. No obvious pathologic adenopathy in this region on the CT data. Incidental CT findings: Emphysema. Stable peripheral  interstitial reticulation with patchy ground-glass attenuation. Coronary, aortic arch, and branch vessel atherosclerotic vascular disease. ABDOMEN/PELVIS: No significant abnormal hypermetabolic activity in this region. Incidental CT findings: Aortoiliac atherosclerotic vascular disease. Enlarged multilobular uterus likely representing uterine fibroids, without a significant degree of hypermetabolic activity to suggest a malignant process. The uterus measures 12.4 by 6.5 by 10.1 cm (volume = 430 cm^3). Trace free pelvic fluid. SKELETON: No significant abnormal hypermetabolic activity in this region. Incidental CT findings: Mild dextroconvex lumbar scoliosis. IMPRESSION: 1. Hypermetabolic right upper lobe lung nodule with hypermetabolic right paratracheal, right hilar, and subcarinal adenopathy, compatible with active malignancy. There is some speckled accentuated activity in the left hilum but not enough to definitively diagnose malignant adenopathy on the left at this point. Accordingly this is considered T1b N2 M0 disease (stage IIIA). 2. Other imaging findings of potential clinical significance: Aortic Atherosclerosis (ICD10-I70.0) and Emphysema (ICD10-J43.9). Coronary atherosclerosis. Stable interstitial reticulation and patchy ground-glass attenuation in the lungs. Large multilobular uterus, likely from fibroids. Trace free pelvic fluid. Electronically Signed   By: Van Clines M.D.   On: 04/14/2019 14:57   CT CHEST LUNG CA SCREEN LOW DOSE W/O CM  Result Date: 03/30/2019 CLINICAL DATA:  Tobacco abuse. One hundred fifty pack-year history. Current asymptomatic smoker. EXAM: CT CHEST WITHOUT CONTRAST LOW-DOSE FOR LUNG CANCER SCREENING TECHNIQUE: Multidetector CT imaging of the chest was performed following the standard protocol without IV contrast. COMPARISON:  None FINDINGS: Cardiovascular: The heart size appears normal. Aortic atherosclerosis. Lad, left circumflex coronary artery atherosclerotic  calcifications identified. Mediastinum/Nodes: Normal appearance of the thyroid gland. The trachea appears patent and is midline. Normal appearance of the esophagus. No enlarged axillary or supraclavicular lymph nodes. Right paratracheal lymph node measures 1.9 cm, image 20/2. Lungs/Pleura: Mild changes of paraseptal and centrilobular emphysema. Bilateral, lower lung zone predominant interstitial reticulation with subpleural banding, patchy ground-glass attenuation, and mild traction bronchiectasis noted. No airspace consolidation, atelectasis, or pneumothorax. Within the right upper lobe there is a solid spiculated nodule with an equivalent diameter of 13.8 mm. Additional smaller solid and non solid nodules are identified within the remaining portions of the lungs. Upper Abdomen: No acute abnormality. Cyst within upper pole of the left kidney measures 1.7 cm. Musculoskeletal: No chest wall mass or suspicious bone lesions identified. IMPRESSION: 1. Lung-RADS 4A, suspicious. Follow up low-dose chest CT without contrast in 3 months (please use the following order, "CT  CHEST LCS NODULE FOLLOW-UP W/O CM") is recommended. Alternatively, PET may be considered when there is a solid component 29mm or larger. 2. Enlarged right paratracheal lymph node. In light of the suspicious nodule in the right upper lobe this is concerning for metastatic adenopathy. 3. Chronic interstitial changes with a lower lung zone predominance which may represent early nonspecific interstitial pneumonia versus usual interstitial pneumonia. Consider further evaluation with high-resolution CT of the chest. 4. Aortic Atherosclerosis (ICD10-I70.0) and Emphysema (ICD10-J43.9). 5. Coronary artery calcifications. Electronically Signed   By: Kerby Moors M.D.   On: 03/30/2019 11:24       IMPRESSION/PLAN: 1. At least Stage IIIA, cT1bN2M0, NSCLC, cannot further classify of the RUL. Dr. Lisbeth Renshaw discusses the pathology findings and reviews the nature of   locally advanced lung cancer. He agrees with completing her work up with a staging MRI of the brain. Her case was discussed in conference and she would be a good candidate for chemoRT provided she doesn't have brain disease. We discussed the risks, benefits, short, and long term effects of radiotherapy, and the patient is interested in proceeding. Dr. Lisbeth Renshaw discusses the delivery and logistics of radiotherapy and anticipates a course of 6 1/2 weeks of radiotherapy with curative intent to the RUL and regional nodes. Written consent is obtained and placed in the chart, a copy was provided to the patient. She will return on Tuesday of next week to proceed with simulation. We anticipate her treatment to begin on 05/02/19.  In a visit lasting 45 minutes, greater than 50% of the time was spent face to face discussing her case, and coordinating the patient's care.   The above documentation reflects my direct findings during this shared patient visit. Please see the separate note by Dr. Lisbeth Renshaw on this date for the remainder of the patient's plan of care.    Carola Rhine, PAC

## 2019-04-21 NOTE — Patient Instructions (Signed)
Steps to Quit Smoking Smoking tobacco is the leading cause of preventable death. It can affect almost every organ in the body. Smoking puts you and people around you at risk for many serious, long-lasting (chronic) diseases. Quitting smoking can be hard, but it is one of the best things that you can do for your health. It is never too late to quit. How do I get ready to quit? When you decide to quit smoking, make a plan to help you succeed. Before you quit:  Pick a date to quit. Set a date within the next 2 weeks to give you time to prepare.  Write down the reasons why you are quitting. Keep this list in places where you will see it often.  Tell your family, friends, and co-workers that you are quitting. Their support is important.  Talk with your doctor about the choices that may help you quit.  Find out if your health insurance will pay for these treatments.  Know the people, places, things, and activities that make you want to smoke (triggers). Avoid them. What first steps can I take to quit smoking?  Throw away all cigarettes at home, at work, and in your car.  Throw away the things that you use when you smoke, such as ashtrays and lighters.  Clean your car. Make sure to empty the ashtray.  Clean your home, including curtains and carpets. What can I do to help me quit smoking? Talk with your doctor about taking medicines and seeing a counselor at the same time. You are more likely to succeed when you do both.  If you are pregnant or breastfeeding, talk with your doctor about counseling or other ways to quit smoking. Do not take medicine to help you quit smoking unless your doctor tells you to do so. To quit smoking: Quit right away  Quit smoking totally, instead of slowly cutting back on how much you smoke over a period of time.  Go to counseling. You are more likely to quit if you go to counseling sessions regularly. Take medicine You may take medicines to help you quit. Some  medicines need a prescription, and some you can buy over-the-counter. Some medicines may contain a drug called nicotine to replace the nicotine in cigarettes. Medicines may:  Help you to stop having the desire to smoke (cravings).  Help to stop the problems that come when you stop smoking (withdrawal symptoms). Your doctor may ask you to use:  Nicotine patches, gum, or lozenges.  Nicotine inhalers or sprays.  Non-nicotine medicine that is taken by mouth. Find resources Find resources and other ways to help you quit smoking and remain smoke-free after you quit. These resources are most helpful when you use them often. They include:  Online chats with a counselor.  Phone quitlines.  Printed self-help materials.  Support groups or group counseling.  Text messaging programs.  Mobile phone apps. Use apps on your mobile phone or tablet that can help you stick to your quit plan. There are many free apps for mobile phones and tablets as well as websites. Examples include Quit Guide from the CDC and smokefree.gov  What things can I do to make it easier to quit?   Talk to your family and friends. Ask them to support and encourage you.  Call a phone quitline (1-800-QUIT-NOW), reach out to support groups, or work with a counselor.  Ask people who smoke to not smoke around you.  Avoid places that make you want to smoke,   such as: ? Bars. ? Parties. ? Smoke-break areas at work.  Spend time with people who do not smoke.  Lower the stress in your life. Stress can make you want to smoke. Try these things to help your stress: ? Getting regular exercise. ? Doing deep-breathing exercises. ? Doing yoga. ? Meditating. ? Doing a body scan. To do this, close your eyes, focus on one area of your body at a time from head to toe. Notice which parts of your body are tense. Try to relax the muscles in those areas. How will I feel when I quit smoking? Day 1 to 3 weeks Within the first 24 hours,  you may start to have some problems that come from quitting tobacco. These problems are very bad 2-3 days after you quit, but they do not often last for more than 2-3 weeks. You may get these symptoms:  Mood swings.  Feeling restless, nervous, angry, or annoyed.  Trouble concentrating.  Dizziness.  Strong desire for high-sugar foods and nicotine.  Weight gain.  Trouble pooping (constipation).  Feeling like you may vomit (nausea).  Coughing or a sore throat.  Changes in how the medicines that you take for other issues work in your body.  Depression.  Trouble sleeping (insomnia). Week 3 and afterward After the first 2-3 weeks of quitting, you may start to notice more positive results, such as:  Better sense of smell and taste.  Less coughing and sore throat.  Slower heart rate.  Lower blood pressure.  Clearer skin.  Better breathing.  Fewer sick days. Quitting smoking can be hard. Do not give up if you fail the first time. Some people need to try a few times before they succeed. Do your best to stick to your quit plan, and talk with your doctor if you have any questions or concerns. Summary  Smoking tobacco is the leading cause of preventable death. Quitting smoking can be hard, but it is one of the best things that you can do for your health.  When you decide to quit smoking, make a plan to help you succeed.  Quit smoking right away, not slowly over a period of time.  When you start quitting, seek help from your doctor, family, or friends. This information is not intended to replace advice given to you by your health care provider. Make sure you discuss any questions you have with your health care provider. Document Revised: 12/10/2018 Document Reviewed: 06/05/2018 Elsevier Patient Education  2020 Elsevier Inc.  

## 2019-04-21 NOTE — Progress Notes (Signed)
START ON PATHWAY REGIMEN - Non-Small Cell Lung     Administer weekly:     Paclitaxel      Carboplatin   **Always confirm dose/schedule in your pharmacy ordering system**  Patient Characteristics: Stage III - Unresectable, PS = 0, 1 AJCC T Category: T1b Current Disease Status: No Distant Mets or Local Recurrence AJCC N Category: N2 AJCC M Category: M0 AJCC 8 Stage Grouping: IIIA ECOG Performance Status: 1 Intent of Therapy: Curative Intent, Discussed with Patient

## 2019-04-22 ENCOUNTER — Telehealth: Payer: Self-pay | Admitting: *Deleted

## 2019-04-22 NOTE — Telephone Encounter (Signed)
Oncology Nurse Navigator Documentation  Oncology Nurse Navigator Flowsheets 04/22/2019  Abnormal Finding Date -  Confirmed Diagnosis Date -  Diagnosis Status Confirmed Diagnosis Complete  Planned Course of Treatment -  Phase of Treatment -  Navigator Follow Up Date: -  Navigator Follow Up Reason: -  Navigator Location [facility]  Referral Date to RadOnc/MedOnc -  Navigator Encounter Type Clinic/MDC;Telephone/I spoke with patient yesterday at thoracic clinic.  I help to explain treatment plan.   I contacted Dr. Valeta Harms to check on MRI brain auth.  Libby from his office contacted me today and updated me that no Josem Kaufmann is needed.  I called radiology to schedule and was given an appt time and date.  I called patient to schedule but was unable to reach her.  Will call back.   Telephone Outgoing Call  [facility] Type Thoracic  Treatment Initiated Date 05/02/2019  Patient Visit Type MedOnc  Treatment Phase Pre-Tx/Tx Discussion  Barriers/Navigation Needs Coordination of Care;Education  Education Newly Diagnosed Cancer Education  Interventions Coordination of Care;Education  Acuity Level 2-Minimal Needs (1-2 Barriers Identified)  Coordination of Care Appts  Education Method Verbal  Time Spent with Patient 12

## 2019-04-22 NOTE — Telephone Encounter (Signed)
I called patient to update her on her appt for MRI Brain.  I was unable to reach or leave vm message.

## 2019-04-25 ENCOUNTER — Encounter: Payer: Self-pay | Admitting: *Deleted

## 2019-04-25 ENCOUNTER — Telehealth: Payer: Self-pay | Admitting: Internal Medicine

## 2019-04-25 ENCOUNTER — Telehealth: Payer: Self-pay | Admitting: *Deleted

## 2019-04-25 NOTE — Progress Notes (Signed)
Oncology Nurse Navigator Documentation  Oncology Nurse Navigator Flowsheets 04/25/2019  Abnormal Finding Date -  Confirmed Diagnosis Date -  Diagnosis Status -  Planned Course of Treatment -  Phase of Treatment -  Navigator Follow Up Date: -  Navigator Follow Up Reason: -  Navigator Location [facility]  Referral Date to RadOnc/MedOnc -Other:  Navigator Encounter Type I followed up on Betty Anderson's schedule.  She has not been scheduled for her chemo yet.  I contact auth coordinator to see if this has been completed and then completed scheduling message to get her scheduled for her treatment.    Telephone -  Multidisiplinary Clinic Type -  Treatment Initiated Date -  Patient Visit Type -  Treatment Phase Pre-Tx/Tx Discussion  Barriers/Navigation Needs Coordination of Care  Education -  Interventions Coordination of Care  Acuity Level 2-Minimal Needs (1-2 Barriers Identified)  Coordination of Care Other  Education Method -  Time Spent with Patient 30

## 2019-04-25 NOTE — Telephone Encounter (Signed)
Left a voicemail letting the patient know that her MRI Brain is scheduled for 05/02/2019 at 12pm, arrival time is 11:30 am.  Left my call back number [phone]) in the event she has further questions.  Will continue to follow as necessary.  Gloriajean Dell. Leonie Green, BSN

## 2019-04-25 NOTE — Telephone Encounter (Signed)
I could not reach patient regarding 2/2 will mail

## 2019-04-26 ENCOUNTER — Other Ambulatory Visit: Payer: Self-pay

## 2019-04-26 ENCOUNTER — Ambulatory Visit
Admission: RE | Admit: 2019-04-26 | Discharge: 2019-04-26 | Disposition: A | Discharge status: Home / Self Care | Payer: Medicare Other | Source: Ambulatory Visit | Attending: Radiation Oncology | Admitting: Radiation Oncology

## 2019-04-26 DIAGNOSIS — C3411 Malignant neoplasm of upper lobe, right bronchus or lung: Secondary | ICD-10-CM | POA: Diagnosis present

## 2019-04-28 DIAGNOSIS — C3411 Malignant neoplasm of upper lobe, right bronchus or lung: Secondary | ICD-10-CM | POA: Diagnosis not present

## 2019-05-02 ENCOUNTER — Ambulatory Visit (HOSPITAL_COMMUNITY): Payer: Medicare Other

## 2019-05-02 ENCOUNTER — Ambulatory Visit
Admission: RE | Admit: 2019-05-02 | Discharge: 2019-05-02 | Disposition: A | Discharge status: Home / Self Care | Payer: Medicare Other | Source: Ambulatory Visit | Attending: Radiation Oncology | Admitting: Radiation Oncology

## 2019-05-02 ENCOUNTER — Other Ambulatory Visit: Payer: Self-pay

## 2019-05-02 DIAGNOSIS — C3411 Malignant neoplasm of upper lobe, right bronchus or lung: Secondary | ICD-10-CM | POA: Diagnosis present

## 2019-05-03 ENCOUNTER — Ambulatory Visit (HOSPITAL_COMMUNITY)
Admission: RE | Admit: 2019-05-03 | Discharge: 2019-05-03 | Disposition: A | Discharge status: Home / Self Care | Payer: Medicare Other | Source: Ambulatory Visit | Attending: Pulmonary Disease | Admitting: Pulmonary Disease

## 2019-05-03 ENCOUNTER — Inpatient Hospital Stay: Payer: Medicare Other

## 2019-05-03 ENCOUNTER — Inpatient Hospital Stay: Payer: Medicare Other | Attending: Internal Medicine

## 2019-05-03 ENCOUNTER — Other Ambulatory Visit: Payer: Self-pay

## 2019-05-03 ENCOUNTER — Ambulatory Visit
Admission: RE | Admit: 2019-05-03 | Discharge: 2019-05-03 | Disposition: A | Discharge status: Home / Self Care | Payer: Medicare Other | Source: Ambulatory Visit | Attending: Radiation Oncology | Admitting: Radiation Oncology

## 2019-05-03 VITALS — BP 107/69 | HR 63 | Temp 98.7°F | Resp 16

## 2019-05-03 DIAGNOSIS — I739 Peripheral vascular disease, unspecified: Secondary | ICD-10-CM | POA: Insufficient documentation

## 2019-05-03 DIAGNOSIS — Z5111 Encounter for antineoplastic chemotherapy: Secondary | ICD-10-CM | POA: Insufficient documentation

## 2019-05-03 DIAGNOSIS — F17210 Nicotine dependence, cigarettes, uncomplicated: Secondary | ICD-10-CM | POA: Insufficient documentation

## 2019-05-03 DIAGNOSIS — I4892 Unspecified atrial flutter: Secondary | ICD-10-CM | POA: Insufficient documentation

## 2019-05-03 DIAGNOSIS — R590 Localized enlarged lymph nodes: Secondary | ICD-10-CM | POA: Insufficient documentation

## 2019-05-03 DIAGNOSIS — I6782 Cerebral ischemia: Secondary | ICD-10-CM | POA: Diagnosis not present

## 2019-05-03 DIAGNOSIS — R1310 Dysphagia, unspecified: Secondary | ICD-10-CM | POA: Diagnosis not present

## 2019-05-03 DIAGNOSIS — I2510 Atherosclerotic heart disease of native coronary artery without angina pectoris: Secondary | ICD-10-CM | POA: Diagnosis not present

## 2019-05-03 DIAGNOSIS — I700 Atherosclerosis of aorta: Secondary | ICD-10-CM | POA: Diagnosis not present

## 2019-05-03 DIAGNOSIS — C3490 Malignant neoplasm of unspecified part of unspecified bronchus or lung: Secondary | ICD-10-CM | POA: Diagnosis present

## 2019-05-03 DIAGNOSIS — J439 Emphysema, unspecified: Secondary | ICD-10-CM | POA: Insufficient documentation

## 2019-05-03 DIAGNOSIS — C3411 Malignant neoplasm of upper lobe, right bronchus or lung: Secondary | ICD-10-CM | POA: Diagnosis present

## 2019-05-03 DIAGNOSIS — R05 Cough: Secondary | ICD-10-CM | POA: Insufficient documentation

## 2019-05-03 DIAGNOSIS — Z79899 Other long term (current) drug therapy: Secondary | ICD-10-CM | POA: Insufficient documentation

## 2019-05-03 LAB — CMP (CANCER CENTER ONLY)
ALT: 11 U/L (ref 0–44)
AST: 12 U/L — ABNORMAL LOW (ref 15–41)
Albumin: 3.7 g/dL (ref 3.5–5.0)
Alkaline Phosphatase: 126 U/L (ref 38–126)
Anion gap: 11 (ref 5–15)
BUN: 13 mg/dL (ref 8–23)
CO2: 24 mmol/L (ref 22–32)
Calcium: 9.4 mg/dL (ref 8.9–10.3)
Chloride: 103 mmol/L (ref 98–111)
Creatinine: 1.18 mg/dL — ABNORMAL HIGH (ref 0.44–1.00)
GFR, Est AFR Am: 56 mL/min — ABNORMAL LOW (ref >60)
GFR, Estimated: 48 mL/min — ABNORMAL LOW (ref >60)
Glucose, Bld: 90 mg/dL (ref 70–99)
Potassium: 4.1 mmol/L (ref 3.5–5.1)
Sodium: 138 mmol/L (ref 135–145)
Total Bilirubin: 0.3 mg/dL (ref 0.3–1.2)
Total Protein: 8.1 g/dL (ref 6.5–8.1)

## 2019-05-03 LAB — CBC WITH DIFFERENTIAL (CANCER CENTER ONLY)
Abs Immature Granulocytes: 0.03 10*3/uL (ref 0.00–0.07)
Basophils Absolute: 0.1 10*3/uL (ref 0.0–0.1)
Basophils Relative: 1 %
Eosinophils Absolute: 0.1 10*3/uL (ref 0.0–0.5)
Eosinophils Relative: 2 %
HCT: 35.7 % — ABNORMAL LOW (ref 36.0–46.0)
Hemoglobin: 12.1 g/dL (ref 12.0–15.0)
Immature Granulocytes: 0 %
Lymphocytes Relative: 20 %
Lymphs Abs: 1.6 10*3/uL (ref 0.7–4.0)
MCH: 32.3 pg (ref 26.0–34.0)
MCHC: 33.9 g/dL (ref 30.0–36.0)
MCV: 95.2 fL (ref 80.0–100.0)
Monocytes Absolute: 0.6 10*3/uL (ref 0.1–1.0)
Monocytes Relative: 8 %
Neutro Abs: 5.5 10*3/uL (ref 1.7–7.7)
Neutrophils Relative %: 69 %
Platelet Count: 423 10*3/uL — ABNORMAL HIGH (ref 150–400)
RBC: 3.75 MIL/uL — ABNORMAL LOW (ref 3.87–5.11)
RDW: 13.6 % (ref 11.5–15.5)
WBC Count: 8 10*3/uL (ref 4.0–10.5)
nRBC: 0 % (ref 0.0–0.2)

## 2019-05-03 MED ORDER — DIPHENHYDRAMINE HCL 50 MG/ML IJ SOLN
50.0000 mg | Freq: Once | INTRAMUSCULAR | Status: AC
  Administered 2019-05-03: 50 mg via INTRAVENOUS

## 2019-05-03 MED ORDER — FAMOTIDINE IN NACL 20-0.9 MG/50ML-% IV SOLN
20.0000 mg | Freq: Once | INTRAVENOUS | Status: AC
  Administered 2019-05-03: 20 mg via INTRAVENOUS

## 2019-05-03 MED ORDER — SODIUM CHLORIDE 0.9 % IV SOLN
177.8000 mg | Freq: Once | INTRAVENOUS | Status: AC
  Administered 2019-05-03: 180 mg via INTRAVENOUS
  Filled 2019-05-03: qty 18

## 2019-05-03 MED ORDER — PALONOSETRON HCL INJECTION 0.25 MG/5ML
0.2500 mg | Freq: Once | INTRAVENOUS | Status: AC
  Administered 2019-05-03: 0.25 mg via INTRAVENOUS

## 2019-05-03 MED ORDER — SODIUM CHLORIDE 0.9 % IV SOLN
Freq: Once | INTRAVENOUS | Status: AC
  Filled 2019-05-03: qty 250

## 2019-05-03 MED ORDER — PALONOSETRON HCL INJECTION 0.25 MG/5ML
INTRAVENOUS | Status: AC
  Filled 2019-05-03: qty 5

## 2019-05-03 MED ORDER — SODIUM CHLORIDE 0.9 % IV SOLN
45.0000 mg/m2 | Freq: Once | INTRAVENOUS | Status: AC
  Administered 2019-05-03: 84 mg via INTRAVENOUS
  Filled 2019-05-03: qty 14

## 2019-05-03 MED ORDER — GADOBUTROL 1 MMOL/ML IV SOLN
6.0000 mL | Freq: Once | INTRAVENOUS | Status: AC | PRN
  Administered 2019-05-03: 7.5 mL via INTRAVENOUS

## 2019-05-03 MED ORDER — SODIUM CHLORIDE 0.9 % IV SOLN
20.0000 mg | Freq: Once | INTRAVENOUS | Status: AC
  Administered 2019-05-03: 10:00:00 20 mg via INTRAVENOUS
  Filled 2019-05-03: qty 20

## 2019-05-03 MED ORDER — DIPHENHYDRAMINE HCL 50 MG/ML IJ SOLN
INTRAMUSCULAR | Status: AC
  Filled 2019-05-03: qty 1

## 2019-05-03 MED ORDER — FAMOTIDINE IN NACL 20-0.9 MG/50ML-% IV SOLN
INTRAVENOUS | Status: AC
  Filled 2019-05-03: qty 50

## 2019-05-03 NOTE — Patient Instructions (Signed)
[facility] Discharge Instructions for Patients Receiving Chemotherapy  Today you received the following chemotherapy agents: Taxol and Carboplatin.  To help prevent nausea and vomiting after your treatment, we encourage you to take your nausea medication as directed.   If you develop nausea and vomiting that is not controlled by your nausea medication, call the clinic.   BELOW ARE SYMPTOMS THAT SHOULD BE REPORTED IMMEDIATELY:  *FEVER GREATER THAN 100.5 F  *CHILLS WITH OR WITHOUT FEVER  NAUSEA AND VOMITING THAT IS NOT CONTROLLED WITH YOUR NAUSEA MEDICATION  *UNUSUAL SHORTNESS OF BREATH  *UNUSUAL BRUISING OR BLEEDING  TENDERNESS IN MOUTH AND THROAT WITH OR WITHOUT PRESENCE OF ULCERS  *URINARY PROBLEMS  *BOWEL PROBLEMS  UNUSUAL RASH Items with * indicate a potential emergency and should be followed up as soon as possible.  Feel free to call the clinic should you have any questions or concerns. The clinic phone number is [phone].  Please show the CHEMO ALERT CARD at check-in to the Emergency Department and triage nurse.  Paclitaxel injection What is this medicine? PACLITAXEL (PAK li TAX el) is a chemotherapy drug. It targets fast dividing cells, like cancer cells, and causes these cells to die. This medicine is used to treat ovarian cancer, breast cancer, lung cancer, Kaposi's sarcoma, and other cancers. This medicine may be used for other purposes; ask your health care provider or pharmacist if you have questions. COMMON BRAND NAME(S): Onxol, Taxol What should I tell my health care provider before I take this medicine? They need to know if you have any of these conditions:  history of irregular heartbeat  liver disease  low blood counts, like low white cell, platelet, or red cell counts  lung or breathing disease, like asthma  tingling of the fingers or toes, or other nerve disorder  an unusual or allergic reaction to paclitaxel, alcohol,  polyoxyethylated castor oil, other chemotherapy, other medicines, foods, dyes, or preservatives  pregnant or trying to get pregnant  breast-feeding How should I use this medicine? This drug is given as an infusion into a vein. It is administered in a hospital or clinic by a specially trained health care professional. Talk to your pediatrician regarding the use of this medicine in children. Special care may be needed. Overdosage: If you think you have taken too much of this medicine contact a poison control center or emergency room at once. NOTE: This medicine is only for you. Do not share this medicine with others. What if I miss a dose? It is important not to miss your dose. Call your doctor or health care professional if you are unable to keep an appointment. What may interact with this medicine? Do not take this medicine with any of the following medications:  disulfiram  metronidazole This medicine may also interact with the following medications:  antiviral medicines for hepatitis, HIV or AIDS  certain antibiotics like erythromycin and clarithromycin  certain medicines for fungal infections like ketoconazole and itraconazole  certain medicines for seizures like carbamazepine, phenobarbital, phenytoin  gemfibrozil  nefazodone  rifampin  St. John's wort This list may not describe all possible interactions. Give your health care provider a list of all the medicines, herbs, non-prescription drugs, or dietary supplements you use. Also tell them if you smoke, drink alcohol, or use illegal drugs. Some items may interact with your medicine. What should I watch for while using this medicine? Your condition will be monitored carefully while you are receiving this medicine. You will need important   blood work done while you are taking this medicine. This medicine can cause serious allergic reactions. To reduce your risk you will need to take other medicine(s) before treatment with this  medicine. If you experience allergic reactions like skin rash, itching or hives, swelling of the face, lips, or tongue, tell your doctor or health care professional right away. In some cases, you may be given additional medicines to help with side effects. Follow all directions for their use. This drug may make you feel generally unwell. This is not uncommon, as chemotherapy can affect healthy cells as well as cancer cells. Report any side effects. Continue your course of treatment even though you feel ill unless your doctor tells you to stop. Call your doctor or health care professional for advice if you get a fever, chills or sore throat, or other symptoms of a cold or flu. Do not treat yourself. This drug decreases your body's ability to fight infections. Try to avoid being around people who are sick. This medicine may increase your risk to bruise or bleed. Call your doctor or health care professional if you notice any unusual bleeding. Be careful brushing and flossing your teeth or using a toothpick because you may get an infection or bleed more easily. If you have any dental work done, tell your dentist you are receiving this medicine. Avoid taking products that contain aspirin, acetaminophen, ibuprofen, naproxen, or ketoprofen unless instructed by your doctor. These medicines may hide a fever. Do not become pregnant while taking this medicine. Women should inform their doctor if they wish to become pregnant or think they might be pregnant. There is a potential for serious side effects to an unborn child. Talk to your health care professional or pharmacist for more information. Do not breast-feed an infant while taking this medicine. Men are advised not to father a child while receiving this medicine. This product may contain alcohol. Ask your pharmacist or healthcare provider if this medicine contains alcohol. Be sure to tell all healthcare providers you are taking this medicine. Certain medicines,  like metronidazole and disulfiram, can cause an unpleasant reaction when taken with alcohol. The reaction includes flushing, headache, nausea, vomiting, sweating, and increased thirst. The reaction can last from 30 minutes to several hours. What side effects may I notice from receiving this medicine? Side effects that you should report to your doctor or health care professional as soon as possible:  allergic reactions like skin rash, itching or hives, swelling of the face, lips, or tongue  breathing problems  changes in vision  fast, irregular heartbeat  high or low blood pressure  mouth sores  pain, tingling, numbness in the hands or feet  signs of decreased platelets or bleeding - bruising, pinpoint red spots on the skin, black, tarry stools, blood in the urine  signs of decreased red blood cells - unusually weak or tired, feeling faint or lightheaded, falls  signs of infection - fever or chills, cough, sore throat, pain or difficulty passing urine  signs and symptoms of liver injury like dark yellow or brown urine; general ill feeling or flu-like symptoms; light-colored stools; loss of appetite; nausea; right upper belly pain; unusually weak or tired; yellowing of the eyes or skin  swelling of the ankles, feet, hands  unusually slow heartbeat Side effects that usually do not require medical attention (report to your doctor or health care professional if they continue or are bothersome):  diarrhea  hair loss  loss of appetite  muscle or joint   pain  nausea, vomiting  pain, redness, or irritation at site where injected  tiredness This list may not describe all possible side effects. Call your doctor for medical advice about side effects. You may report side effects to FDA at 1-800-FDA-1088. Where should I keep my medicine? This drug is given in a hospital or clinic and will not be stored at home. NOTE: This sheet is a summary. It may not cover all possible information.  If you have questions about this medicine, talk to your doctor, pharmacist, or health care provider.  2020 Elsevier/Gold Standard (2016-11-18 13:14:55) Carboplatin injection What is this medicine? CARBOPLATIN (KAR boe pla tin) is a chemotherapy drug. It targets fast dividing cells, like cancer cells, and causes these cells to die. This medicine is used to treat ovarian cancer and many other cancers. This medicine may be used for other purposes; ask your health care provider or pharmacist if you have questions. COMMON BRAND NAME(S): Paraplatin What should I tell my health care provider before I take this medicine? They need to know if you have any of these conditions:  blood disorders  hearing problems  kidney disease  recent or ongoing radiation therapy  an unusual or allergic reaction to carboplatin, cisplatin, other chemotherapy, other medicines, foods, dyes, or preservatives  pregnant or trying to get pregnant  breast-feeding How should I use this medicine? This drug is usually given as an infusion into a vein. It is administered in a hospital or clinic by a specially trained health care professional. Talk to your pediatrician regarding the use of this medicine in children. Special care may be needed. Overdosage: If you think you have taken too much of this medicine contact a poison control center or emergency room at once. NOTE: This medicine is only for you. Do not share this medicine with others. What if I miss a dose? It is important not to miss a dose. Call your doctor or health care professional if you are unable to keep an appointment. What may interact with this medicine?  medicines for seizures  medicines to increase blood counts like filgrastim, pegfilgrastim, sargramostim  some antibiotics like amikacin, gentamicin, neomycin, streptomycin, tobramycin  vaccines Talk to your doctor or health care professional before taking any of these  medicines:  acetaminophen  aspirin  ibuprofen  ketoprofen  naproxen This list may not describe all possible interactions. Give your health care provider a list of all the medicines, herbs, non-prescription drugs, or dietary supplements you use. Also tell them if you smoke, drink alcohol, or use illegal drugs. Some items may interact with your medicine. What should I watch for while using this medicine? Your condition will be monitored carefully while you are receiving this medicine. You will need important blood work done while you are taking this medicine. This drug may make you feel generally unwell. This is not uncommon, as chemotherapy can affect healthy cells as well as cancer cells. Report any side effects. Continue your course of treatment even though you feel ill unless your doctor tells you to stop. In some cases, you may be given additional medicines to help with side effects. Follow all directions for their use. Call your doctor or health care professional for advice if you get a fever, chills or sore throat, or other symptoms of a cold or flu. Do not treat yourself. This drug decreases your body's ability to fight infections. Try to avoid being around people who are sick. This medicine may increase your risk to bruise or   bleed. Call your doctor or health care professional if you notice any unusual bleeding. Be careful brushing and flossing your teeth or using a toothpick because you may get an infection or bleed more easily. If you have any dental work done, tell your dentist you are receiving this medicine. Avoid taking products that contain aspirin, acetaminophen, ibuprofen, naproxen, or ketoprofen unless instructed by your doctor. These medicines may hide a fever. Do not become pregnant while taking this medicine. Women should inform their doctor if they wish to become pregnant or think they might be pregnant. There is a potential for serious side effects to an unborn child. Talk  to your health care professional or pharmacist for more information. Do not breast-feed an infant while taking this medicine. What side effects may I notice from receiving this medicine? Side effects that you should report to your doctor or health care professional as soon as possible:  allergic reactions like skin rash, itching or hives, swelling of the face, lips, or tongue  signs of infection - fever or chills, cough, sore throat, pain or difficulty passing urine  signs of decreased platelets or bleeding - bruising, pinpoint red spots on the skin, black, tarry stools, nosebleeds  signs of decreased red blood cells - unusually weak or tired, fainting spells, lightheadedness  breathing problems  changes in hearing  changes in vision  chest pain  high blood pressure  low blood counts - This drug may decrease the number of white blood cells, red blood cells and platelets. You may be at increased risk for infections and bleeding.  nausea and vomiting  pain, swelling, redness or irritation at the injection site  pain, tingling, numbness in the hands or feet  problems with balance, talking, walking  trouble passing urine or change in the amount of urine Side effects that usually do not require medical attention (report to your doctor or health care professional if they continue or are bothersome):  hair loss  loss of appetite  metallic taste in the mouth or changes in taste This list may not describe all possible side effects. Call your doctor for medical advice about side effects. You may report side effects to FDA at 1-800-FDA-1088. Where should I keep my medicine? This drug is given in a hospital or clinic and will not be stored at home. NOTE: This sheet is a summary. It may not cover all possible information. If you have questions about this medicine, talk to your doctor, pharmacist, or health care provider.  2020 Elsevier/Gold Standard (2007-06-22 14:38:05)  

## 2019-05-04 ENCOUNTER — Telehealth: Payer: Self-pay | Admitting: *Deleted

## 2019-05-04 ENCOUNTER — Other Ambulatory Visit: Payer: Self-pay

## 2019-05-04 ENCOUNTER — Ambulatory Visit
Admission: RE | Admit: 2019-05-04 | Discharge: 2019-05-04 | Disposition: A | Discharge status: Home / Self Care | Payer: Medicare Other | Source: Ambulatory Visit | Attending: Radiation Oncology | Admitting: Radiation Oncology

## 2019-05-04 DIAGNOSIS — C3411 Malignant neoplasm of upper lobe, right bronchus or lung: Secondary | ICD-10-CM | POA: Diagnosis not present

## 2019-05-05 ENCOUNTER — Encounter: Payer: Self-pay | Admitting: Emergency Medicine

## 2019-05-05 ENCOUNTER — Other Ambulatory Visit: Payer: Self-pay

## 2019-05-05 ENCOUNTER — Ambulatory Visit
Admission: RE | Admit: 2019-05-05 | Discharge: 2019-05-05 | Disposition: A | Discharge status: Home / Self Care | Payer: Medicare Other | Source: Ambulatory Visit | Attending: Radiation Oncology | Admitting: Radiation Oncology

## 2019-05-05 DIAGNOSIS — C3411 Malignant neoplasm of upper lobe, right bronchus or lung: Secondary | ICD-10-CM | POA: Diagnosis not present

## 2019-05-06 ENCOUNTER — Other Ambulatory Visit: Payer: Self-pay

## 2019-05-06 ENCOUNTER — Ambulatory Visit
Admission: RE | Admit: 2019-05-06 | Discharge: 2019-05-06 | Disposition: A | Discharge status: Home / Self Care | Payer: Medicare Other | Source: Ambulatory Visit | Attending: Radiation Oncology | Admitting: Radiation Oncology

## 2019-05-06 DIAGNOSIS — C3411 Malignant neoplasm of upper lobe, right bronchus or lung: Secondary | ICD-10-CM

## 2019-05-06 MED ORDER — SONAFINE EX EMUL
1.0000 "application " | Freq: Once | CUTANEOUS | Status: AC
  Administered 2019-05-06: 1 via TOPICAL

## 2019-05-06 NOTE — Progress Notes (Signed)

## 2019-05-08 ENCOUNTER — Encounter: Payer: Self-pay | Admitting: *Deleted

## 2019-05-09 ENCOUNTER — Other Ambulatory Visit: Payer: Self-pay

## 2019-05-09 ENCOUNTER — Ambulatory Visit
Admission: RE | Admit: 2019-05-09 | Discharge: 2019-05-09 | Disposition: A | Discharge status: Home / Self Care | Payer: Medicare Other | Source: Ambulatory Visit | Attending: Radiation Oncology | Admitting: Radiation Oncology

## 2019-05-09 ENCOUNTER — Inpatient Hospital Stay: Payer: Medicare Other

## 2019-05-09 ENCOUNTER — Telehealth: Payer: Self-pay | Admitting: *Deleted

## 2019-05-09 ENCOUNTER — Ambulatory Visit: Payer: Medicare Other | Admitting: Internal Medicine

## 2019-05-09 ENCOUNTER — Other Ambulatory Visit: Payer: Medicare Other

## 2019-05-09 ENCOUNTER — Telehealth: Payer: Self-pay | Admitting: Internal Medicine

## 2019-05-09 ENCOUNTER — Inpatient Hospital Stay: Payer: Medicare Other | Admitting: Internal Medicine

## 2019-05-09 DIAGNOSIS — C3411 Malignant neoplasm of upper lobe, right bronchus or lung: Secondary | ICD-10-CM | POA: Diagnosis not present

## 2019-05-09 NOTE — Telephone Encounter (Signed)
Called and spoke with patient. Confirmed appts  °

## 2019-05-09 NOTE — Telephone Encounter (Signed)
Oncology Nurse Navigator Documentation  Oncology Nurse Navigator Flowsheets 05/09/2019  Abnormal Finding Date -  Confirmed Diagnosis Date -  Diagnosis Status -  Planned Course of Treatment -  Phase of Treatment -  Navigator Follow Up Date: -  Navigator Follow Up Reason: -  Navigator Location [facility]  Referral Date to RadOnc/MedOnc -  Navigator Encounter Type Telephone/I received a message from [place] that patient had questions on her appts.  I called and clarified.    Telephone Outgoing Call  Multidisiplinary Clinic Type -  Treatment Initiated Date -  Patient Visit Type -  Treatment Phase Treatment  Barriers/Navigation Needs Education  Education Other  Interventions Education  Acuity Level 2-Minimal Needs (1-2 Barriers Identified)  Coordination of Care -  Education Method Verbal  Time Spent with Patient 30

## 2019-05-09 NOTE — Telephone Encounter (Signed)
Received message from Friday with pt concern about conflicting schedule.  She reports that she received a copy of schedule from Ed RN & then received a call from a lady who gave her different info.  She states she is confused.  Called pt & informed of correct schedule which is in chart.  She states satisfaction for information.

## 2019-05-10 ENCOUNTER — Ambulatory Visit
Admission: RE | Admit: 2019-05-10 | Discharge: 2019-05-10 | Disposition: A | Discharge status: Home / Self Care | Payer: Medicare Other | Source: Ambulatory Visit | Attending: Radiation Oncology | Admitting: Radiation Oncology

## 2019-05-10 ENCOUNTER — Encounter: Payer: Self-pay | Admitting: Internal Medicine

## 2019-05-10 ENCOUNTER — Encounter: Payer: Self-pay | Admitting: *Deleted

## 2019-05-10 ENCOUNTER — Inpatient Hospital Stay (HOSPITAL_BASED_OUTPATIENT_CLINIC_OR_DEPARTMENT_OTHER): Payer: Medicare Other | Admitting: Internal Medicine

## 2019-05-10 ENCOUNTER — Other Ambulatory Visit: Payer: Self-pay

## 2019-05-10 ENCOUNTER — Inpatient Hospital Stay: Payer: Medicare Other

## 2019-05-10 ENCOUNTER — Other Ambulatory Visit: Payer: Self-pay | Admitting: Medical Oncology

## 2019-05-10 VITALS — BP 96/44 | HR 83 | Temp 98.5°F | Resp 17 | Ht 62.0 in | Wt 170.2 lb

## 2019-05-10 DIAGNOSIS — Z5111 Encounter for antineoplastic chemotherapy: Secondary | ICD-10-CM

## 2019-05-10 DIAGNOSIS — C3411 Malignant neoplasm of upper lobe, right bronchus or lung: Secondary | ICD-10-CM

## 2019-05-10 DIAGNOSIS — F17200 Nicotine dependence, unspecified, uncomplicated: Secondary | ICD-10-CM | POA: Diagnosis not present

## 2019-05-10 LAB — CBC WITH DIFFERENTIAL (CANCER CENTER ONLY)
Abs Immature Granulocytes: 0.03 10*3/uL (ref 0.00–0.07)
Basophils Absolute: 0.1 10*3/uL (ref 0.0–0.1)
Basophils Relative: 1 %
Eosinophils Absolute: 0.2 10*3/uL (ref 0.0–0.5)
Eosinophils Relative: 4 %
HCT: 33.5 % — ABNORMAL LOW (ref 36.0–46.0)
Hemoglobin: 10.9 g/dL — ABNORMAL LOW (ref 12.0–15.0)
Immature Granulocytes: 1 %
Lymphocytes Relative: 22 %
Lymphs Abs: 1.3 10*3/uL (ref 0.7–4.0)
MCH: 32.2 pg (ref 26.0–34.0)
MCHC: 32.5 g/dL (ref 30.0–36.0)
MCV: 98.8 fL (ref 80.0–100.0)
Monocytes Absolute: 0.4 10*3/uL (ref 0.1–1.0)
Monocytes Relative: 7 %
Neutro Abs: 3.9 10*3/uL (ref 1.7–7.7)
Neutrophils Relative %: 65 %
Platelet Count: 334 10*3/uL (ref 150–400)
RBC: 3.39 MIL/uL — ABNORMAL LOW (ref 3.87–5.11)
RDW: 13.6 % (ref 11.5–15.5)
WBC Count: 5.9 10*3/uL (ref 4.0–10.5)
nRBC: 0 % (ref 0.0–0.2)

## 2019-05-10 LAB — CMP (CANCER CENTER ONLY)
ALT: 10 U/L (ref 0–44)
AST: 11 U/L — ABNORMAL LOW (ref 15–41)
Albumin: 3.5 g/dL (ref 3.5–5.0)
Alkaline Phosphatase: 94 U/L (ref 38–126)
Anion gap: 7 (ref 5–15)
BUN: 22 mg/dL (ref 8–23)
CO2: 24 mmol/L (ref 22–32)
Calcium: 9.4 mg/dL (ref 8.9–10.3)
Chloride: 108 mmol/L (ref 98–111)
Creatinine: 0.99 mg/dL (ref 0.44–1.00)
GFR, Est AFR Am: >60 mL/min (ref >60)
GFR, Estimated: 60 mL/min — ABNORMAL LOW (ref >60)
Glucose, Bld: 85 mg/dL (ref 70–99)
Potassium: 4.3 mmol/L (ref 3.5–5.1)
Sodium: 139 mmol/L (ref 135–145)
Total Bilirubin: 0.3 mg/dL (ref 0.3–1.2)
Total Protein: 7.6 g/dL (ref 6.5–8.1)

## 2019-05-10 NOTE — Progress Notes (Signed)
[place] [phone]   Fax:(336) [place], MD [place] [address]  DIAGNOSIS: Stage IIIA (T1b, N2, M0) non-small cell lung cancer presented with right upper lobe lung nodule in addition to mediastinal lymphadenopathy diagnosed in January 2021.  PRIOR THERAPY: None.  CURRENT THERAPY: Concurrent chemoradiation with weekly carboplatin for AUC of 2 and paclitaxel 45 NG/M2.  Status post 1 cycle.  INTERVAL HISTORY: Betty Anderson 64 y.o. female returns to the clinic today for follow-up visit accompanied by her husband.  The patient is feeling fine today with no concerning complaints.  She tolerated the first cycle of her treatment with concurrent chemoradiation fairly well.  She denied having any chest pain, shortness of breath, cough or hemoptysis.  She denied having any fever or chills.  She has no nausea, vomiting, diarrhea or constipation.  She denied having any headache or visual changes.  The patient is here today for evaluation before starting cycle #2.  MEDICAL HISTORY: Past Medical History:  Diagnosis Date  . COPD (chronic obstructive pulmonary disease) (HCC)    emphysema  . Hyperlipidemia   . Hypertension   . Lung nodule   . Paroxysmal atrial flutter ([place])    per cardiology note  . Peripheral artery disease ([place])   . Tobacco abuse   . Vitamin D deficiency   . Wears glasses     ALLERGIES:  has No Known Allergies.  MEDICATIONS:  Current Outpatient Medications  Medication Sig Dispense Refill  . aspirin EC 81 MG tablet Take 1 tablet (81 mg total) by mouth daily. 90 tablet 3  . Cholecalciferol (DIALYVITE VITAMIN D 5000) 125 MCG (5000 UT) capsule Take 5,000 Units by mouth daily.    Marland Kitchen escitalopram (LEXAPRO) 10 MG tablet Take 10 mg by mouth daily.    Marland Kitchen olmesartan (BENICAR) 20 MG tablet Take 20 mg by mouth every evening.     . prochlorperazine (COMPAZINE) 10 MG tablet Take 1 tablet (10  mg total) by mouth every 6 (six) hours as needed for nausea or vomiting. 30 tablet 0  . rosuvastatin (CRESTOR) 20 MG tablet Take 20 mg by mouth every evening.     . vitamin B-12 (CYANOCOBALAMIN) 1000 MCG tablet Take 1,000 mcg by mouth daily.     No current facility-administered medications for this visit.    SURGICAL HISTORY:  Past Surgical History:  Procedure Laterality Date  . BRONCHIAL NEEDLE ASPIRATION BIOPSY  04/12/2019   Procedure: BRONCHIAL NEEDLE ASPIRATION BIOPSIES;  Surgeon: Garner Nash, DO;  Location: [facility];  Service: Cardiopulmonary;;  . ENDOBRONCHIAL ULTRASOUND N/A 04/12/2019   Procedure: ENDOBRONCHIAL ULTRASOUND;  Surgeon: Garner Nash, DO;  Location: [facility];  Service: Cardiopulmonary;  Laterality: N/A;  . OVARIAN CYST REMOVAL    . TONSILLECTOMY    . TUBAL LIGATION    . VIDEO BRONCHOSCOPY N/A 04/12/2019   Procedure: VIDEO BRONCHOSCOPY WITHOUT FLUORO;  Surgeon: Garner Nash, DO;  Location: [facility];  Service: Cardiopulmonary;  Laterality: N/A;    REVIEW OF SYSTEMS:  A comprehensive review of systems was negative except for: Constitutional: positive for fatigue   PHYSICAL EXAMINATION: General appearance: alert, cooperative and no distress Head: Normocephalic, without obvious abnormality, atraumatic Neck: no adenopathy, no JVD, supple, symmetrical, trachea midline and thyroid not enlarged, symmetric, no tenderness/mass/nodules Lymph nodes: Cervical, supraclavicular, and axillary nodes normal. Resp: clear to auscultation bilaterally Back: symmetric, no curvature. ROM normal. No CVA tenderness. Cardio: regular rate  and rhythm, S1, S2 normal, no murmur, click, rub or gallop GI: soft, non-tender; bowel sounds normal; no masses,  no organomegaly Extremities: extremities normal, atraumatic, no cyanosis or edema  ECOG PERFORMANCE STATUS: 1 - Symptomatic but completely ambulatory  Blood pressure (!) 96/44, pulse 83, temperature 98.5 F (36.9 C),  temperature source Temporal, resp. rate 17, height 5\' 2"  (1.575 m), weight 170 lb 3.2 oz (77.2 kg), SpO2 100 %.  LABORATORY DATA: Lab Results  Component Value Date   WBC 5.9 05/10/2019   HGB 10.9 (L) 05/10/2019   HCT 33.5 (L) 05/10/2019   MCV 98.8 05/10/2019   PLT 334 05/10/2019      Chemistry      Component Value Date/Time   NA 138 05/03/2019 0817   K 4.1 05/03/2019 0817   CL 103 05/03/2019 0817   CO2 24 05/03/2019 0817   BUN 13 05/03/2019 0817   CREATININE 1.18 (H) 05/03/2019 0817      Component Value Date/Time   CALCIUM 9.4 05/03/2019 0817   ALKPHOS 126 05/03/2019 0817   AST 12 (L) 05/03/2019 0817   ALT 11 05/03/2019 0817   BILITOT 0.3 05/03/2019 0817       RADIOGRAPHIC STUDIES: MR BRAIN W WO CONTRAST  Result Date: 05/03/2019 CLINICAL DATA:  Recently diagnosed with non-small cell lung cancer. Rule out metastases. EXAM: MRI HEAD WITHOUT AND WITH CONTRAST TECHNIQUE: Multiplanar, multiecho pulse sequences of the brain and surrounding structures were obtained without and with intravenous contrast. CONTRAST:  7.41mL GADAVIST GADOBUTROL 1 MMOL/ML IV SOLN COMPARISON:  None. FINDINGS: Brain: No acute infarction, hemorrhage, hydrocephalus, extra-axial collection or mass lesion. Small scattered foci of T2 hyperintensity are seen in the white matter of the cerebral hemispheres, likely related to mild chronic small vessel disease. Vascular: Normal flow voids. Skull and upper cervical spine: Normal marrow signal. Sinuses/Orbits: Negative. Other: Partial empty sella is incidentally noted. The study is partially degraded by motion artifact. IMPRESSION: 1. No acute intracranial abnormality. No evidence of intracranial metastatic disease. 2. Mild chronic small vessel disease. Electronically Signed   By: Pedro Earls M.D.   On: 05/03/2019 09:47   NM PET - Initial Skull Base To Thigh  Result Date: 04/14/2019 CLINICAL DATA:  Initial treatment strategy for non-small cell lung  cancer. EXAM: NUCLEAR MEDICINE PET SKULL BASE TO THIGH TECHNIQUE: 9.2 mCi F-18 FDG was injected intravenously. Full-ring PET imaging was performed from the skull base to thigh after the radiotracer. CT data was obtained and used for attenuation correction and anatomic localization. Fasting blood glucose: 73 mg/dl COMPARISON:  CT chest 03/30/2019 FINDINGS: Mediastinal blood pool activity: SUV max 2.5 Liver activity: SUV max NA NECK: No significant abnormal hypermetabolic activity in this region. Incidental CT findings: none CHEST: The spiculated 1.4 by 1.3 cm right upper lobe pulmonary nodule on image 17/8 has a maximum SUV of 13.4. The right paratracheal lymph node measuring 1.9 cm in short axis on image 61/4 has maximum SUV of 19.3. A right hilar lymph node measuring approximately 0.7 cm in short axis on image 65/4 has a maximum SUV of 8.7. Subcarinal node measuring approximately 1.1 cm in short axis on image 68/4 has a maximum SUV of 6.3. This is difficult separate from the esophagus and some of this activity could be esophageal, but adenopathy along the anterior margin of the esophagus is favored as the likely cause. Speckled accentuated activity in the left hilum with maximum SUV up to 3.8, significance uncertain. No obvious pathologic adenopathy in this  region on the CT data. Incidental CT findings: Emphysema. Stable peripheral interstitial reticulation with patchy ground-glass attenuation. Coronary, aortic arch, and branch vessel atherosclerotic vascular disease. ABDOMEN/PELVIS: No significant abnormal hypermetabolic activity in this region. Incidental CT findings: Aortoiliac atherosclerotic vascular disease. Enlarged multilobular uterus likely representing uterine fibroids, without a significant degree of hypermetabolic activity to suggest a malignant process. The uterus measures 12.4 by 6.5 by 10.1 cm (volume = 430 cm^3). Trace free pelvic fluid. SKELETON: No significant abnormal hypermetabolic activity in  this region. Incidental CT findings: Mild dextroconvex lumbar scoliosis. IMPRESSION: 1. Hypermetabolic right upper lobe lung nodule with hypermetabolic right paratracheal, right hilar, and subcarinal adenopathy, compatible with active malignancy. There is some speckled accentuated activity in the left hilum but not enough to definitively diagnose malignant adenopathy on the left at this point. Accordingly this is considered T1b N2 M0 disease (stage IIIA). 2. Other imaging findings of potential clinical significance: Aortic Atherosclerosis (ICD10-I70.0) and Emphysema (ICD10-J43.9). Coronary atherosclerosis. Stable interstitial reticulation and patchy ground-glass attenuation in the lungs. Large multilobular uterus, likely from fibroids. Trace free pelvic fluid. Electronically Signed   By: Van Clines M.D.   On: 04/14/2019 14:57    ASSESSMENT AND PLAN: This is a very pleasant 64 years old white female with a stage IIIa non-small cell lung cancer, presented with right upper lobe lung nodule and mediastinal lymphadenopathy diagnosed in January 2021.  The patient is currently undergoing a course of concurrent chemoradiation with weekly carboplatin and paclitaxel status post 1 cycle. I recommended for the patient to proceed with cycle #2 today as planned. I will see her back for follow-up visit in 2 weeks for evaluation before starting cycle #4. For smoking cessation I strongly encouraged the patient to quit smoking. The patient was advised to call immediately if she has any concerning symptoms in the interval. The patient voices understanding of current disease status and treatment options and is in agreement with the current care plan.  All questions were answered. The patient knows to call the clinic with any problems, questions or concerns. We can certainly see the patient much sooner if necessary.   Disclaimer: This note was dictated with voice recognition software. Similar sounding words can  inadvertently be transcribed and may not be corrected upon review.

## 2019-05-10 NOTE — Progress Notes (Signed)
Oncology Nurse Navigator Documentation  Oncology Nurse Navigator Flowsheets 05/10/2019  Abnormal Finding Date -  Confirmed Diagnosis Date -  Diagnosis Status -  Planned Course of Treatment -  Phase of Treatment -  Navigator Follow Up Date: -  Navigator Follow Up Reason: -  Navigator Location [facility]  Referral Date to RadOnc/MedOnc -  Navigator Encounter Type Clinic/MDC/I spoke with patient today at clinic. She is doing well with treatment plan and understands regimen.  I did educate her on smoking cessation.  I set up Nav appt to go over barriers to care and smoking cessation.  She agreed to appt time.   Telephone -  Multidisiplinary Clinic Type -  Treatment Initiated Date -  Patient Visit Type MedOnc  Treatment Phase Treatment  Barriers/Navigation Needs Education  Education Smoking cessation;Other  Interventions Education  Acuity Level 2-Minimal Needs (1-2 Barriers Identified)  Coordination of Care -  Education Method Verbal  Time Spent with Patient 15

## 2019-05-11 ENCOUNTER — Ambulatory Visit
Admission: RE | Admit: 2019-05-11 | Discharge: 2019-05-11 | Disposition: A | Discharge status: Home / Self Care | Payer: Medicare Other | Source: Ambulatory Visit | Attending: Radiation Oncology | Admitting: Radiation Oncology

## 2019-05-11 ENCOUNTER — Other Ambulatory Visit: Payer: Self-pay

## 2019-05-11 ENCOUNTER — Inpatient Hospital Stay: Payer: Medicare Other

## 2019-05-11 VITALS — BP 99/56 | HR 75 | Temp 98.0°F | Resp 18

## 2019-05-11 DIAGNOSIS — C3411 Malignant neoplasm of upper lobe, right bronchus or lung: Secondary | ICD-10-CM | POA: Diagnosis not present

## 2019-05-11 DIAGNOSIS — Z5111 Encounter for antineoplastic chemotherapy: Secondary | ICD-10-CM | POA: Diagnosis not present

## 2019-05-11 MED ORDER — DIPHENHYDRAMINE HCL 50 MG/ML IJ SOLN
INTRAMUSCULAR | Status: AC
  Filled 2019-05-11: qty 1

## 2019-05-11 MED ORDER — SODIUM CHLORIDE 0.9 % IV SOLN
45.0000 mg/m2 | Freq: Once | INTRAVENOUS | Status: AC
  Administered 2019-05-11: 11:00:00 84 mg via INTRAVENOUS
  Filled 2019-05-11: qty 14

## 2019-05-11 MED ORDER — SODIUM CHLORIDE 0.9 % IV SOLN
177.8000 mg | Freq: Once | INTRAVENOUS | Status: AC
  Administered 2019-05-11: 180 mg via INTRAVENOUS
  Filled 2019-05-11: qty 18

## 2019-05-11 MED ORDER — DIPHENHYDRAMINE HCL 50 MG/ML IJ SOLN
50.0000 mg | Freq: Once | INTRAMUSCULAR | Status: AC
  Administered 2019-05-11: 10:00:00 50 mg via INTRAVENOUS

## 2019-05-11 MED ORDER — SODIUM CHLORIDE 0.9 % IV SOLN
Freq: Once | INTRAVENOUS | Status: AC
  Filled 2019-05-11: qty 250

## 2019-05-11 MED ORDER — PALONOSETRON HCL INJECTION 0.25 MG/5ML
INTRAVENOUS | Status: AC
  Filled 2019-05-11: qty 5

## 2019-05-11 MED ORDER — FAMOTIDINE IN NACL 20-0.9 MG/50ML-% IV SOLN
INTRAVENOUS | Status: AC
  Filled 2019-05-11: qty 50

## 2019-05-11 MED ORDER — FAMOTIDINE IN NACL 20-0.9 MG/50ML-% IV SOLN
20.0000 mg | Freq: Once | INTRAVENOUS | Status: AC
  Administered 2019-05-11: 20 mg via INTRAVENOUS

## 2019-05-11 MED ORDER — SODIUM CHLORIDE 0.9 % IV SOLN
20.0000 mg | Freq: Once | INTRAVENOUS | Status: AC
  Administered 2019-05-11: 20 mg via INTRAVENOUS
  Filled 2019-05-11: qty 20

## 2019-05-11 MED ORDER — PALONOSETRON HCL INJECTION 0.25 MG/5ML
0.2500 mg | Freq: Once | INTRAVENOUS | Status: AC
  Administered 2019-05-11: 0.25 mg via INTRAVENOUS

## 2019-05-11 NOTE — Patient Instructions (Signed)
   [facility] Discharge Instructions for Patients Receiving Chemotherapy  Today you received the following chemotherapy agents Taxol and Carboplatin   To help prevent nausea and vomiting after your treatment, we encourage you to take your nausea medication as directed.    If you develop nausea and vomiting that is not controlled by your nausea medication, call the clinic.   BELOW ARE SYMPTOMS THAT SHOULD BE REPORTED IMMEDIATELY:  *FEVER GREATER THAN 100.5 F  *CHILLS WITH OR WITHOUT FEVER  NAUSEA AND VOMITING THAT IS NOT CONTROLLED WITH YOUR NAUSEA MEDICATION  *UNUSUAL SHORTNESS OF BREATH  *UNUSUAL BRUISING OR BLEEDING  TENDERNESS IN MOUTH AND THROAT WITH OR WITHOUT PRESENCE OF ULCERS  *URINARY PROBLEMS  *BOWEL PROBLEMS  UNUSUAL RASH Items with * indicate a potential emergency and should be followed up as soon as possible.  Feel free to call the clinic should you have any questions or concerns. The clinic phone number is [phone].  Please show the CHEMO ALERT CARD at check-in to the Emergency Department and triage nurse.   

## 2019-05-12 ENCOUNTER — Other Ambulatory Visit: Payer: Self-pay

## 2019-05-12 ENCOUNTER — Ambulatory Visit
Admission: RE | Admit: 2019-05-12 | Discharge: 2019-05-12 | Disposition: A | Discharge status: Home / Self Care | Payer: Medicare Other | Source: Ambulatory Visit | Attending: Radiation Oncology | Admitting: Radiation Oncology

## 2019-05-12 DIAGNOSIS — C3411 Malignant neoplasm of upper lobe, right bronchus or lung: Secondary | ICD-10-CM | POA: Diagnosis not present

## 2019-05-13 ENCOUNTER — Telehealth: Payer: Self-pay | Admitting: Medical Oncology

## 2019-05-13 ENCOUNTER — Ambulatory Visit
Admission: RE | Admit: 2019-05-13 | Discharge: 2019-05-13 | Disposition: A | Discharge status: Home / Self Care | Payer: Medicare Other | Source: Ambulatory Visit | Attending: Radiation Oncology | Admitting: Radiation Oncology

## 2019-05-13 ENCOUNTER — Other Ambulatory Visit: Payer: Self-pay

## 2019-05-13 ENCOUNTER — Telehealth: Payer: Self-pay | Admitting: Internal Medicine

## 2019-05-13 DIAGNOSIS — C3411 Malignant neoplasm of upper lobe, right bronchus or lung: Secondary | ICD-10-CM | POA: Diagnosis not present

## 2019-05-13 NOTE — Telephone Encounter (Signed)
Scheduled per los. Called and spoke with patient. Confirmed appts  

## 2019-05-13 NOTE — Telephone Encounter (Addendum)
"  why are my chemotherapy appts cancelled?'. They should not be cancelled. Pt very upset. Schedule message sent.

## 2019-05-16 ENCOUNTER — Inpatient Hospital Stay: Payer: Medicare Other

## 2019-05-16 ENCOUNTER — Other Ambulatory Visit: Payer: Self-pay

## 2019-05-16 ENCOUNTER — Ambulatory Visit: Payer: Medicare Other

## 2019-05-16 ENCOUNTER — Ambulatory Visit
Admission: RE | Admit: 2019-05-16 | Discharge: 2019-05-16 | Disposition: A | Discharge status: Home / Self Care | Payer: Medicare Other | Source: Ambulatory Visit | Attending: Radiation Oncology | Admitting: Radiation Oncology

## 2019-05-16 ENCOUNTER — Other Ambulatory Visit: Payer: Medicare Other

## 2019-05-16 VITALS — BP 91/52 | HR 79 | Temp 97.8°F | Resp 18

## 2019-05-16 DIAGNOSIS — C3411 Malignant neoplasm of upper lobe, right bronchus or lung: Secondary | ICD-10-CM | POA: Diagnosis not present

## 2019-05-16 DIAGNOSIS — Z5111 Encounter for antineoplastic chemotherapy: Secondary | ICD-10-CM | POA: Diagnosis not present

## 2019-05-16 LAB — CBC WITH DIFFERENTIAL (CANCER CENTER ONLY)
Abs Immature Granulocytes: 0.02 10*3/uL (ref 0.00–0.07)
Basophils Absolute: 0 10*3/uL (ref 0.0–0.1)
Basophils Relative: 1 %
Eosinophils Absolute: 0.1 10*3/uL (ref 0.0–0.5)
Eosinophils Relative: 2 %
HCT: 34.8 % — ABNORMAL LOW (ref 36.0–46.0)
Hemoglobin: 11.4 g/dL — ABNORMAL LOW (ref 12.0–15.0)
Immature Granulocytes: 1 %
Lymphocytes Relative: 15 %
Lymphs Abs: 0.6 10*3/uL — ABNORMAL LOW (ref 0.7–4.0)
MCH: 32 pg (ref 26.0–34.0)
MCHC: 32.8 g/dL (ref 30.0–36.0)
MCV: 97.8 fL (ref 80.0–100.0)
Monocytes Absolute: 0.1 10*3/uL (ref 0.1–1.0)
Monocytes Relative: 3 %
Neutro Abs: 3.2 10*3/uL (ref 1.7–7.7)
Neutrophils Relative %: 78 %
Platelet Count: 257 10*3/uL (ref 150–400)
RBC: 3.56 MIL/uL — ABNORMAL LOW (ref 3.87–5.11)
RDW: 13.5 % (ref 11.5–15.5)
WBC Count: 4.1 10*3/uL (ref 4.0–10.5)
nRBC: 0 % (ref 0.0–0.2)

## 2019-05-16 LAB — CMP (CANCER CENTER ONLY)
ALT: 13 U/L (ref 0–44)
AST: 15 U/L (ref 15–41)
Albumin: 3.5 g/dL (ref 3.5–5.0)
Alkaline Phosphatase: 85 U/L (ref 38–126)
Anion gap: 7 (ref 5–15)
BUN: 18 mg/dL (ref 8–23)
CO2: 23 mmol/L (ref 22–32)
Calcium: 9.2 mg/dL (ref 8.9–10.3)
Chloride: 106 mmol/L (ref 98–111)
Creatinine: 1.22 mg/dL — ABNORMAL HIGH (ref 0.44–1.00)
GFR, Est AFR Am: 54 mL/min — ABNORMAL LOW (ref >60)
GFR, Estimated: 46 mL/min — ABNORMAL LOW (ref >60)
Glucose, Bld: 87 mg/dL (ref 70–99)
Potassium: 4.6 mmol/L (ref 3.5–5.1)
Sodium: 136 mmol/L (ref 135–145)
Total Bilirubin: 0.4 mg/dL (ref 0.3–1.2)
Total Protein: 7.5 g/dL (ref 6.5–8.1)

## 2019-05-16 MED ORDER — SODIUM CHLORIDE 0.9 % IV SOLN
160.0000 mg | Freq: Once | INTRAVENOUS | Status: AC
  Administered 2019-05-16: 13:00:00 160 mg via INTRAVENOUS
  Filled 2019-05-16: qty 16

## 2019-05-16 MED ORDER — DIPHENHYDRAMINE HCL 50 MG/ML IJ SOLN
INTRAMUSCULAR | Status: AC
  Filled 2019-05-16: qty 1

## 2019-05-16 MED ORDER — FAMOTIDINE IN NACL 20-0.9 MG/50ML-% IV SOLN
INTRAVENOUS | Status: AC
  Filled 2019-05-16: qty 50

## 2019-05-16 MED ORDER — FAMOTIDINE IN NACL 20-0.9 MG/50ML-% IV SOLN
20.0000 mg | Freq: Once | INTRAVENOUS | Status: AC
  Administered 2019-05-16: 20 mg via INTRAVENOUS

## 2019-05-16 MED ORDER — SODIUM CHLORIDE 0.9 % IV SOLN
20.0000 mg | Freq: Once | INTRAVENOUS | Status: AC
  Administered 2019-05-16: 20 mg via INTRAVENOUS
  Filled 2019-05-16: qty 20

## 2019-05-16 MED ORDER — SODIUM CHLORIDE 0.9 % IV SOLN
Freq: Once | INTRAVENOUS | Status: AC
  Filled 2019-05-16: qty 250

## 2019-05-16 MED ORDER — PALONOSETRON HCL INJECTION 0.25 MG/5ML
0.2500 mg | Freq: Once | INTRAVENOUS | Status: AC
  Administered 2019-05-16: 0.25 mg via INTRAVENOUS

## 2019-05-16 MED ORDER — SODIUM CHLORIDE 0.9 % IV SOLN
45.0000 mg/m2 | Freq: Once | INTRAVENOUS | Status: AC
  Administered 2019-05-16: 84 mg via INTRAVENOUS
  Filled 2019-05-16: qty 14

## 2019-05-16 MED ORDER — PALONOSETRON HCL INJECTION 0.25 MG/5ML
INTRAVENOUS | Status: AC
  Filled 2019-05-16: qty 5

## 2019-05-16 MED ORDER — DIPHENHYDRAMINE HCL 50 MG/ML IJ SOLN
50.0000 mg | Freq: Once | INTRAMUSCULAR | Status: AC
  Administered 2019-05-16: 50 mg via INTRAVENOUS

## 2019-05-16 NOTE — Patient Instructions (Signed)
   [facility] Discharge Instructions for Patients Receiving Chemotherapy  Today you received the following chemotherapy agents Taxol and Carboplatin   To help prevent nausea and vomiting after your treatment, we encourage you to take your nausea medication as directed.    If you develop nausea and vomiting that is not controlled by your nausea medication, call the clinic.   BELOW ARE SYMPTOMS THAT SHOULD BE REPORTED IMMEDIATELY:  *FEVER GREATER THAN 100.5 F  *CHILLS WITH OR WITHOUT FEVER  NAUSEA AND VOMITING THAT IS NOT CONTROLLED WITH YOUR NAUSEA MEDICATION  *UNUSUAL SHORTNESS OF BREATH  *UNUSUAL BRUISING OR BLEEDING  TENDERNESS IN MOUTH AND THROAT WITH OR WITHOUT PRESENCE OF ULCERS  *URINARY PROBLEMS  *BOWEL PROBLEMS  UNUSUAL RASH Items with * indicate a potential emergency and should be followed up as soon as possible.  Feel free to call the clinic should you have any questions or concerns. The clinic phone number is [phone].  Please show the CHEMO ALERT CARD at check-in to the Emergency Department and triage nurse.   

## 2019-05-17 ENCOUNTER — Ambulatory Visit
Admission: RE | Admit: 2019-05-17 | Discharge: 2019-05-17 | Disposition: A | Discharge status: Home / Self Care | Payer: Medicare Other | Source: Ambulatory Visit | Attending: Radiation Oncology | Admitting: Radiation Oncology

## 2019-05-17 ENCOUNTER — Other Ambulatory Visit: Payer: Self-pay

## 2019-05-17 ENCOUNTER — Telehealth: Payer: Self-pay | Admitting: *Deleted

## 2019-05-17 DIAGNOSIS — C3411 Malignant neoplasm of upper lobe, right bronchus or lung: Secondary | ICD-10-CM | POA: Diagnosis not present

## 2019-05-17 NOTE — Telephone Encounter (Signed)
Spoke with the patient regarding message I received from the treatment staff.  She asked if her husband could come down to the treatment area with her due to her having low energy and low BP.  Spoke with PA Dara Lords and she has granted Mr. Mcloud permission to come in with his wife.  Patient made aware.  Advised to follow-up with her PCP regarding the low BP.  Will continue to follow as necessary.  Betty Anderson. Leonie Green, BSN

## 2019-05-18 ENCOUNTER — Ambulatory Visit
Admission: RE | Admit: 2019-05-18 | Discharge: 2019-05-18 | Disposition: A | Discharge status: Home / Self Care | Payer: Medicare Other | Source: Ambulatory Visit | Attending: Radiation Oncology | Admitting: Radiation Oncology

## 2019-05-18 ENCOUNTER — Other Ambulatory Visit: Payer: Self-pay

## 2019-05-18 ENCOUNTER — Ambulatory Visit: Payer: Medicare Other

## 2019-05-18 ENCOUNTER — Other Ambulatory Visit: Payer: Medicare Other

## 2019-05-18 DIAGNOSIS — C3411 Malignant neoplasm of upper lobe, right bronchus or lung: Secondary | ICD-10-CM | POA: Diagnosis not present

## 2019-05-19 ENCOUNTER — Ambulatory Visit: Payer: Medicare Other

## 2019-05-20 ENCOUNTER — Other Ambulatory Visit: Payer: Self-pay | Admitting: Radiation Oncology

## 2019-05-20 ENCOUNTER — Ambulatory Visit
Admission: RE | Admit: 2019-05-20 | Discharge: 2019-05-20 | Disposition: A | Discharge status: Home / Self Care | Payer: Medicare Other | Source: Ambulatory Visit | Attending: Radiation Oncology | Admitting: Radiation Oncology

## 2019-05-20 ENCOUNTER — Other Ambulatory Visit: Payer: Self-pay

## 2019-05-20 DIAGNOSIS — C3411 Malignant neoplasm of upper lobe, right bronchus or lung: Secondary | ICD-10-CM | POA: Diagnosis not present

## 2019-05-20 MED ORDER — SUCRALFATE 1 G PO TABS: 1.0000 g | ORAL_TABLET | Freq: Four times a day (QID) | ORAL | 2 refills | Status: DC

## 2019-05-23 ENCOUNTER — Inpatient Hospital Stay (HOSPITAL_BASED_OUTPATIENT_CLINIC_OR_DEPARTMENT_OTHER): Payer: Medicare Other | Admitting: Internal Medicine

## 2019-05-23 ENCOUNTER — Inpatient Hospital Stay: Payer: Medicare Other

## 2019-05-23 ENCOUNTER — Telehealth: Payer: Self-pay | Admitting: Internal Medicine

## 2019-05-23 ENCOUNTER — Encounter: Payer: Self-pay | Admitting: Internal Medicine

## 2019-05-23 ENCOUNTER — Ambulatory Visit
Admission: RE | Admit: 2019-05-23 | Discharge: 2019-05-23 | Disposition: A | Discharge status: Home / Self Care | Payer: Medicare Other | Source: Ambulatory Visit | Attending: Radiation Oncology | Admitting: Radiation Oncology

## 2019-05-23 ENCOUNTER — Other Ambulatory Visit: Payer: Self-pay

## 2019-05-23 ENCOUNTER — Encounter: Payer: Self-pay | Admitting: *Deleted

## 2019-05-23 VITALS — BP 84/63 | HR 92 | Temp 98.0°F | Resp 18 | Ht 62.0 in | Wt 167.1 lb

## 2019-05-23 DIAGNOSIS — Z5111 Encounter for antineoplastic chemotherapy: Secondary | ICD-10-CM | POA: Diagnosis not present

## 2019-05-23 DIAGNOSIS — C3411 Malignant neoplasm of upper lobe, right bronchus or lung: Secondary | ICD-10-CM

## 2019-05-23 DIAGNOSIS — F17200 Nicotine dependence, unspecified, uncomplicated: Secondary | ICD-10-CM | POA: Diagnosis not present

## 2019-05-23 LAB — CBC WITH DIFFERENTIAL (CANCER CENTER ONLY)
Abs Immature Granulocytes: 0.01 10*3/uL (ref 0.00–0.07)
Basophils Absolute: 0.1 10*3/uL (ref 0.0–0.1)
Basophils Relative: 1 %
Eosinophils Absolute: 0.1 10*3/uL (ref 0.0–0.5)
Eosinophils Relative: 2 %
HCT: 31.5 % — ABNORMAL LOW (ref 36.0–46.0)
Hemoglobin: 10.6 g/dL — ABNORMAL LOW (ref 12.0–15.0)
Immature Granulocytes: 0 %
Lymphocytes Relative: 14 %
Lymphs Abs: 0.5 10*3/uL — ABNORMAL LOW (ref 0.7–4.0)
MCH: 32.5 pg (ref 26.0–34.0)
MCHC: 33.7 g/dL (ref 30.0–36.0)
MCV: 96.6 fL (ref 80.0–100.0)
Monocytes Absolute: 0.3 10*3/uL (ref 0.1–1.0)
Monocytes Relative: 7 %
Neutro Abs: 2.8 10*3/uL (ref 1.7–7.7)
Neutrophils Relative %: 76 %
Platelet Count: 241 10*3/uL (ref 150–400)
RBC: 3.26 MIL/uL — ABNORMAL LOW (ref 3.87–5.11)
RDW: 13.2 % (ref 11.5–15.5)
WBC Count: 3.7 10*3/uL — ABNORMAL LOW (ref 4.0–10.5)
nRBC: 0 % (ref 0.0–0.2)

## 2019-05-23 LAB — CMP (CANCER CENTER ONLY)
ALT: 15 U/L (ref 0–44)
AST: 13 U/L — ABNORMAL LOW (ref 15–41)
Albumin: 3.6 g/dL (ref 3.5–5.0)
Alkaline Phosphatase: 102 U/L (ref 38–126)
Anion gap: 8 (ref 5–15)
BUN: 19 mg/dL (ref 8–23)
CO2: 23 mmol/L (ref 22–32)
Calcium: 9.5 mg/dL (ref 8.9–10.3)
Chloride: 106 mmol/L (ref 98–111)
Creatinine: 0.95 mg/dL (ref 0.44–1.00)
GFR, Est AFR Am: >60 mL/min (ref >60)
GFR, Estimated: >60 mL/min (ref >60)
Glucose, Bld: 84 mg/dL (ref 70–99)
Potassium: 4.1 mmol/L (ref 3.5–5.1)
Sodium: 137 mmol/L (ref 135–145)
Total Bilirubin: 0.4 mg/dL (ref 0.3–1.2)
Total Protein: 7.9 g/dL (ref 6.5–8.1)

## 2019-05-23 MED ORDER — SODIUM CHLORIDE 0.9 % IV SOLN
Freq: Once | INTRAVENOUS | Status: AC
  Filled 2019-05-23: qty 250

## 2019-05-23 MED ORDER — FAMOTIDINE IN NACL 20-0.9 MG/50ML-% IV SOLN
20.0000 mg | Freq: Once | INTRAVENOUS | Status: AC
  Administered 2019-05-23: 10:00:00 20 mg via INTRAVENOUS

## 2019-05-23 MED ORDER — PALONOSETRON HCL INJECTION 0.25 MG/5ML
0.2500 mg | Freq: Once | INTRAVENOUS | Status: AC
  Administered 2019-05-23: 10:00:00 0.25 mg via INTRAVENOUS

## 2019-05-23 MED ORDER — SODIUM CHLORIDE 0.9 % IV SOLN
20.0000 mg | Freq: Once | INTRAVENOUS | Status: AC
  Administered 2019-05-23: 20 mg via INTRAVENOUS
  Filled 2019-05-23: qty 20

## 2019-05-23 MED ORDER — DIPHENHYDRAMINE HCL 50 MG/ML IJ SOLN
INTRAMUSCULAR | Status: AC
  Filled 2019-05-23: qty 1

## 2019-05-23 MED ORDER — SODIUM CHLORIDE 0.9 % IV SOLN
45.0000 mg/m2 | Freq: Once | INTRAVENOUS | Status: AC
  Administered 2019-05-23: 11:00:00 84 mg via INTRAVENOUS
  Filled 2019-05-23: qty 14

## 2019-05-23 MED ORDER — PALONOSETRON HCL INJECTION 0.25 MG/5ML
INTRAVENOUS | Status: AC
  Filled 2019-05-23: qty 5

## 2019-05-23 MED ORDER — FAMOTIDINE IN NACL 20-0.9 MG/50ML-% IV SOLN
INTRAVENOUS | Status: AC
  Filled 2019-05-23: qty 50

## 2019-05-23 MED ORDER — DIPHENHYDRAMINE HCL 50 MG/ML IJ SOLN
50.0000 mg | Freq: Once | INTRAMUSCULAR | Status: AC
  Administered 2019-05-23: 50 mg via INTRAVENOUS

## 2019-05-23 MED ORDER — SODIUM CHLORIDE 0.9 % IV SOLN
177.8000 mg | Freq: Once | INTRAVENOUS | Status: AC
  Administered 2019-05-23: 180 mg via INTRAVENOUS
  Filled 2019-05-23: qty 18

## 2019-05-23 NOTE — Progress Notes (Signed)
[facility] [place] Telephone:([phone]   Fax:(336) [place], [place] [address]  DIAGNOSIS: Stage IIIA (T1b, N2, M0) non-small cell lung cancer presented with right upper lobe lung nodule in addition to mediastinal lymphadenopathy diagnosed in January 2021.  PRIOR THERAPY: None.  CURRENT THERAPY: Concurrent chemoradiation with weekly carboplatin for AUC of 2 and paclitaxel 45 NG/M2.  Status post 3 cycle.  INTERVAL HISTORY: Betty Anderson 64 y.o. female returns to the clinic today for follow-up visit.  The patient is feeling fine today with no concerning complaints except for mild odynophagia as well as cough.  She denied having any chest pain, shortness of breath or hemoptysis.  She denied having any fever or chills.  She has no nausea, vomiting, diarrhea or constipation.  She lost 3 pounds since her last visit.  Her blood pressure is still low and her primary care physician discontinued her blood pressure medications.  The patient is here today for evaluation before starting cycle #4 of her treatment.  MEDICAL HISTORY: Past Medical History:  Diagnosis Date   COPD (chronic obstructive pulmonary disease) (HCC)    emphysema   Hyperlipidemia    Hypertension    Lung nodule    Paroxysmal atrial flutter ([place])    per cardiology note   Peripheral artery disease ([place])    Tobacco abuse    Vitamin D deficiency    Wears glasses     ALLERGIES:  has No Known Allergies.  MEDICATIONS:  Current Outpatient Medications  Medication Sig Dispense Refill   aspirin EC 81 MG tablet Take 1 tablet (81 mg total) by mouth daily. 90 tablet 3   Cholecalciferol (DIALYVITE VITAMIN D 5000) 125 MCG (5000 UT) capsule Take 5,000 Units by mouth daily.     escitalopram (LEXAPRO) 10 MG tablet Take 10 mg by mouth daily.     olmesartan (BENICAR) 20 MG tablet Take 20 mg by mouth every evening.      prochlorperazine  (COMPAZINE) 10 MG tablet Take 1 tablet (10 mg total) by mouth every 6 (six) hours as needed for nausea or vomiting. 30 tablet 0   rosuvastatin (CRESTOR) 20 MG tablet Take 20 mg by mouth every evening.      sucralfate (CARAFATE) 1 g tablet Take 1 tablet (1 g total) by mouth 4 (four) times daily. Dissolve each tablet in 15 cc water before use. 120 tablet 2   vitamin B-12 (CYANOCOBALAMIN) 1000 MCG tablet Take 1,000 mcg by mouth daily.     No current facility-administered medications for this visit.    SURGICAL HISTORY:  Past Surgical History:  Procedure Laterality Date   BRONCHIAL NEEDLE ASPIRATION BIOPSY  04/12/2019   Procedure: BRONCHIAL NEEDLE ASPIRATION BIOPSIES;  Surgeon: Garner Nash, DO;  Location: [facility];  Service: Cardiopulmonary;;   ENDOBRONCHIAL ULTRASOUND N/A 04/12/2019   Procedure: ENDOBRONCHIAL ULTRASOUND;  Surgeon: Garner Nash, DO;  Location: [facility];  Service: Cardiopulmonary;  Laterality: N/A;   OVARIAN CYST REMOVAL     TONSILLECTOMY     TUBAL LIGATION     VIDEO BRONCHOSCOPY N/A 04/12/2019   Procedure: VIDEO BRONCHOSCOPY WITHOUT FLUORO;  Surgeon: Garner Nash, DO;  Location: [facility];  Service: Cardiopulmonary;  Laterality: N/A;    REVIEW OF SYSTEMS:  A comprehensive review of systems was negative except for: Constitutional: positive for fatigue and weight loss Respiratory: positive for cough Gastrointestinal: positive for odynophagia   PHYSICAL EXAMINATION: General appearance: alert,  cooperative, fatigued and no distress Head: Normocephalic, without obvious abnormality, atraumatic Neck: no adenopathy, no JVD, supple, symmetrical, trachea midline and thyroid not enlarged, symmetric, no tenderness/mass/nodules Lymph nodes: Cervical, supraclavicular, and axillary nodes normal. Resp: clear to auscultation bilaterally Back: symmetric, no curvature. ROM normal. No CVA tenderness. Cardio: regular rate and rhythm, S1, S2 normal, no murmur,  click, rub or gallop GI: soft, non-tender; bowel sounds normal; no masses,  no organomegaly Extremities: extremities normal, atraumatic, no cyanosis or edema  ECOG PERFORMANCE STATUS: 1 - Symptomatic but completely ambulatory  Blood pressure (!) 84/63, pulse 92, temperature 98 F (36.7 C), temperature source Temporal, resp. rate 18, height 5\' 2"  (1.575 m), weight 167 lb 1.6 oz (75.8 kg), SpO2 100 %.  LABORATORY DATA: Lab Results  Component Value Date   WBC 3.7 (L) 05/23/2019   HGB 10.6 (L) 05/23/2019   HCT 31.5 (L) 05/23/2019   MCV 96.6 05/23/2019   PLT 241 05/23/2019      Chemistry      Component Value Date/Time   NA 136 05/16/2019 0858   K 4.6 05/16/2019 0858   CL 106 05/16/2019 0858   CO2 23 05/16/2019 0858   BUN 18 05/16/2019 0858   CREATININE 1.22 (H) 05/16/2019 0858      Component Value Date/Time   CALCIUM 9.2 05/16/2019 0858   ALKPHOS 85 05/16/2019 0858   AST 15 05/16/2019 0858   ALT 13 05/16/2019 0858   BILITOT 0.4 05/16/2019 0858       RADIOGRAPHIC STUDIES: MR BRAIN W WO CONTRAST  Result Date: 05/03/2019 CLINICAL DATA:  Recently diagnosed with non-small cell lung cancer. Rule out metastases. EXAM: MRI HEAD WITHOUT AND WITH CONTRAST TECHNIQUE: Multiplanar, multiecho pulse sequences of the brain and surrounding structures were obtained without and with intravenous contrast. CONTRAST:  7.46mL GADAVIST GADOBUTROL 1 MMOL/ML IV SOLN COMPARISON:  None. FINDINGS: Brain: No acute infarction, hemorrhage, hydrocephalus, extra-axial collection or mass lesion. Small scattered foci of T2 hyperintensity are seen in the white matter of the cerebral hemispheres, likely related to mild chronic small vessel disease. Vascular: Normal flow voids. Skull and upper cervical spine: Normal marrow signal. Sinuses/Orbits: Negative. Other: Partial empty sella is incidentally noted. The study is partially degraded by motion artifact. IMPRESSION: 1. No acute intracranial abnormality. No evidence of  intracranial metastatic disease. 2. Mild chronic small vessel disease. Electronically Signed   By: Pedro Earls M.D.   On: 05/03/2019 09:47    ASSESSMENT AND PLAN: This is a very pleasant 64 years old white female with stage IIIA non-small cell lung cancer, presented with right upper lobe lung nodule and mediastinal lymphadenopathy diagnosed in January 2021.  The patient is currently undergoing a course of concurrent chemoradiation with weekly carboplatin and paclitaxel status post 3 cycles. The patient continues to tolerate her treatment well except for mild odynophagia and fatigue as well as dry cough. I recommended for her to proceed with cycle #4 today as planned. For smoking cessation, I strongly encouraged the patient to quit smoking. She will come back for follow-up visit in 2 weeks for evaluation before starting cycle #6. The patient was advised to call immediately if she has any concerning symptoms in the interval. The patient voices understanding of current disease status and treatment options and is in agreement with the current care plan.  All questions were answered. The patient knows to call the clinic with any problems, questions or concerns. We can certainly see the patient much sooner if necessary.  Disclaimer: This note was dictated with voice recognition software. Similar sounding words can inadvertently be transcribed and may not be corrected upon review.

## 2019-05-23 NOTE — Patient Instructions (Signed)
Steps to Quit Smoking Smoking tobacco is the leading cause of preventable death. It can affect almost every organ in the body. Smoking puts you and people around you at risk for many serious, long-lasting (chronic) diseases. Quitting smoking can be hard, but it is one of the best things that you can do for your health. It is never too late to quit. How do I get ready to quit? When you decide to quit smoking, make a plan to help you succeed. Before you quit:  Pick a date to quit. Set a date within the next 2 weeks to give you time to prepare.  Write down the reasons why you are quitting. Keep this list in places where you will see it often.  Tell your family, friends, and co-workers that you are quitting. Their support is important.  Talk with your doctor about the choices that may help you quit.  Find out if your health insurance will pay for these treatments.  Know the people, places, things, and activities that make you want to smoke (triggers). Avoid them. What first steps can I take to quit smoking?  Throw away all cigarettes at home, at work, and in your car.  Throw away the things that you use when you smoke, such as ashtrays and lighters.  Clean your car. Make sure to empty the ashtray.  Clean your home, including curtains and carpets. What can I do to help me quit smoking? Talk with your doctor about taking medicines and seeing a counselor at the same time. You are more likely to succeed when you do both.  If you are pregnant or breastfeeding, talk with your doctor about counseling or other ways to quit smoking. Do not take medicine to help you quit smoking unless your doctor tells you to do so. To quit smoking: Quit right away  Quit smoking totally, instead of slowly cutting back on how much you smoke over a period of time.  Go to counseling. You are more likely to quit if you go to counseling sessions regularly. Take medicine You may take medicines to help you quit. Some  medicines need a prescription, and some you can buy over-the-counter. Some medicines may contain a drug called nicotine to replace the nicotine in cigarettes. Medicines may:  Help you to stop having the desire to smoke (cravings).  Help to stop the problems that come when you stop smoking (withdrawal symptoms). Your doctor may ask you to use:  Nicotine patches, gum, or lozenges.  Nicotine inhalers or sprays.  Non-nicotine medicine that is taken by mouth. Find resources Find resources and other ways to help you quit smoking and remain smoke-free after you quit. These resources are most helpful when you use them often. They include:  Online chats with a counselor.  Phone quitlines.  Printed self-help materials.  Support groups or group counseling.  Text messaging programs.  Mobile phone apps. Use apps on your mobile phone or tablet that can help you stick to your quit plan. There are many free apps for mobile phones and tablets as well as websites. Examples include Quit Guide from the CDC and smokefree.gov  What things can I do to make it easier to quit?   Talk to your family and friends. Ask them to support and encourage you.  Call a phone quitline (1-800-QUIT-NOW), reach out to support groups, or work with a counselor.  Ask people who smoke to not smoke around you.  Avoid places that make you want to smoke,   such as: ? Bars. ? Parties. ? Smoke-break areas at work.  Spend time with people who do not smoke.  Lower the stress in your life. Stress can make you want to smoke. Try these things to help your stress: ? Getting regular exercise. ? Doing deep-breathing exercises. ? Doing yoga. ? Meditating. ? Doing a body scan. To do this, close your eyes, focus on one area of your body at a time from head to toe. Notice which parts of your body are tense. Try to relax the muscles in those areas. How will I feel when I quit smoking? Day 1 to 3 weeks Within the first 24 hours,  you may start to have some problems that come from quitting tobacco. These problems are very bad 2-3 days after you quit, but they do not often last for more than 2-3 weeks. You may get these symptoms:  Mood swings.  Feeling restless, nervous, angry, or annoyed.  Trouble concentrating.  Dizziness.  Strong desire for high-sugar foods and nicotine.  Weight gain.  Trouble pooping (constipation).  Feeling like you may vomit (nausea).  Coughing or a sore throat.  Changes in how the medicines that you take for other issues work in your body.  Depression.  Trouble sleeping (insomnia). Week 3 and afterward After the first 2-3 weeks of quitting, you may start to notice more positive results, such as:  Better sense of smell and taste.  Less coughing and sore throat.  Slower heart rate.  Lower blood pressure.  Clearer skin.  Better breathing.  Fewer sick days. Quitting smoking can be hard. Do not give up if you fail the first time. Some people need to try a few times before they succeed. Do your best to stick to your quit plan, and talk with your doctor if you have any questions or concerns. Summary  Smoking tobacco is the leading cause of preventable death. Quitting smoking can be hard, but it is one of the best things that you can do for your health.  When you decide to quit smoking, make a plan to help you succeed.  Quit smoking right away, not slowly over a period of time.  When you start quitting, seek help from your doctor, family, or friends. This information is not intended to replace advice given to you by your health care provider. Make sure you discuss any questions you have with your health care provider. Document Revised: 12/10/2018 Document Reviewed: 06/05/2018 Elsevier Patient Education  2020 Elsevier Inc.  

## 2019-05-23 NOTE — Patient Instructions (Signed)
[facility] Discharge Instructions for Patients Receiving Chemotherapy  Today you received the following chemotherapy agents Taxol, Carboplatin  To help prevent nausea and vomiting after your treatment, we encourage you to take your nausea medication as directed  If you develop nausea and vomiting that is not controlled by your nausea medication, call the clinic.   BELOW ARE SYMPTOMS THAT SHOULD BE REPORTED IMMEDIATELY:  *FEVER GREATER THAN 100.5 F  *CHILLS WITH OR WITHOUT FEVER  NAUSEA AND VOMITING THAT IS NOT CONTROLLED WITH YOUR NAUSEA MEDICATION  *UNUSUAL SHORTNESS OF BREATH  *UNUSUAL BRUISING OR BLEEDING  TENDERNESS IN MOUTH AND THROAT WITH OR WITHOUT PRESENCE OF ULCERS  *URINARY PROBLEMS  *BOWEL PROBLEMS  UNUSUAL RASH Items with * indicate a potential emergency and should be followed up as soon as possible.  Feel free to call the clinic should you have any questions or concerns. The clinic phone number is [phone].  Please show the CHEMO ALERT CARD at check-in to the Emergency Department and triage nurse.   

## 2019-05-23 NOTE — Telephone Encounter (Signed)
Scheduled per los. Called and left msg. Mailed printout  °

## 2019-05-24 ENCOUNTER — Ambulatory Visit: Payer: Medicare Other | Admitting: Physician Assistant

## 2019-05-24 ENCOUNTER — Other Ambulatory Visit: Payer: Medicare Other

## 2019-05-24 ENCOUNTER — Inpatient Hospital Stay (HOSPITAL_BASED_OUTPATIENT_CLINIC_OR_DEPARTMENT_OTHER): Payer: Medicare Other | Admitting: *Deleted

## 2019-05-24 ENCOUNTER — Ambulatory Visit: Admission: RE | Admit: 2019-05-24 | Payer: Medicare Other | Source: Ambulatory Visit

## 2019-05-24 ENCOUNTER — Other Ambulatory Visit: Payer: Self-pay

## 2019-05-24 DIAGNOSIS — C3411 Malignant neoplasm of upper lobe, right bronchus or lung: Secondary | ICD-10-CM | POA: Diagnosis not present

## 2019-05-25 ENCOUNTER — Other Ambulatory Visit: Payer: Self-pay

## 2019-05-25 ENCOUNTER — Ambulatory Visit: Admission: RE | Admit: 2019-05-25 | Payer: Medicare Other | Source: Ambulatory Visit

## 2019-05-25 ENCOUNTER — Ambulatory Visit: Payer: Medicare Other

## 2019-05-25 DIAGNOSIS — C3411 Malignant neoplasm of upper lobe, right bronchus or lung: Secondary | ICD-10-CM | POA: Diagnosis not present

## 2019-05-26 ENCOUNTER — Ambulatory Visit
Admission: RE | Admit: 2019-05-26 | Discharge: 2019-05-26 | Disposition: A | Discharge status: Home / Self Care | Payer: Medicare Other | Source: Ambulatory Visit | Attending: Radiation Oncology | Admitting: Radiation Oncology

## 2019-05-26 ENCOUNTER — Other Ambulatory Visit: Payer: Self-pay

## 2019-05-26 DIAGNOSIS — C3411 Malignant neoplasm of upper lobe, right bronchus or lung: Secondary | ICD-10-CM | POA: Diagnosis not present

## 2019-05-27 ENCOUNTER — Ambulatory Visit
Admission: RE | Admit: 2019-05-27 | Discharge: 2019-05-27 | Disposition: A | Discharge status: Home / Self Care | Payer: Medicare Other | Source: Ambulatory Visit | Attending: Radiation Oncology | Admitting: Radiation Oncology

## 2019-05-27 ENCOUNTER — Other Ambulatory Visit: Payer: Self-pay

## 2019-05-27 DIAGNOSIS — C3411 Malignant neoplasm of upper lobe, right bronchus or lung: Secondary | ICD-10-CM | POA: Diagnosis not present

## 2019-05-30 ENCOUNTER — Other Ambulatory Visit: Payer: Self-pay

## 2019-05-30 ENCOUNTER — Other Ambulatory Visit: Payer: Self-pay | Admitting: Medical Oncology

## 2019-05-30 ENCOUNTER — Ambulatory Visit
Admission: RE | Admit: 2019-05-30 | Discharge: 2019-05-30 | Disposition: A | Discharge status: Home / Self Care | Payer: Medicare Other | Source: Ambulatory Visit | Attending: Radiation Oncology | Admitting: Radiation Oncology

## 2019-05-30 ENCOUNTER — Inpatient Hospital Stay: Payer: Medicare Other

## 2019-05-30 ENCOUNTER — Inpatient Hospital Stay: Payer: Medicare Other | Attending: Internal Medicine

## 2019-05-30 VITALS — BP 120/75 | HR 95 | Temp 98.3°F | Resp 18 | Wt 165.5 lb

## 2019-05-30 DIAGNOSIS — J029 Acute pharyngitis, unspecified: Secondary | ICD-10-CM | POA: Insufficient documentation

## 2019-05-30 DIAGNOSIS — I959 Hypotension, unspecified: Secondary | ICD-10-CM | POA: Diagnosis not present

## 2019-05-30 DIAGNOSIS — C3411 Malignant neoplasm of upper lobe, right bronchus or lung: Secondary | ICD-10-CM | POA: Diagnosis present

## 2019-05-30 DIAGNOSIS — R590 Localized enlarged lymph nodes: Secondary | ICD-10-CM | POA: Insufficient documentation

## 2019-05-30 DIAGNOSIS — Z5111 Encounter for antineoplastic chemotherapy: Secondary | ICD-10-CM | POA: Diagnosis present

## 2019-05-30 DIAGNOSIS — I4892 Unspecified atrial flutter: Secondary | ICD-10-CM | POA: Diagnosis not present

## 2019-05-30 DIAGNOSIS — R5383 Other fatigue: Secondary | ICD-10-CM | POA: Diagnosis not present

## 2019-05-30 DIAGNOSIS — R1310 Dysphagia, unspecified: Secondary | ICD-10-CM | POA: Diagnosis not present

## 2019-05-30 DIAGNOSIS — Z79899 Other long term (current) drug therapy: Secondary | ICD-10-CM | POA: Insufficient documentation

## 2019-05-30 LAB — CMP (CANCER CENTER ONLY)
ALT: 11 U/L (ref 0–44)
AST: 12 U/L — ABNORMAL LOW (ref 15–41)
Albumin: 3.3 g/dL — ABNORMAL LOW (ref 3.5–5.0)
Alkaline Phosphatase: 86 U/L (ref 38–126)
Anion gap: 8 (ref 5–15)
BUN: 14 mg/dL (ref 8–23)
CO2: 25 mmol/L (ref 22–32)
Calcium: 8.8 mg/dL — ABNORMAL LOW (ref 8.9–10.3)
Chloride: 106 mmol/L (ref 98–111)
Creatinine: 0.9 mg/dL (ref 0.44–1.00)
GFR, Est AFR Am: >60 mL/min (ref >60)
GFR, Estimated: >60 mL/min (ref >60)
Glucose, Bld: 87 mg/dL (ref 70–99)
Potassium: 3.8 mmol/L (ref 3.5–5.1)
Sodium: 139 mmol/L (ref 135–145)
Total Bilirubin: 0.5 mg/dL (ref 0.3–1.2)
Total Protein: 7.2 g/dL (ref 6.5–8.1)

## 2019-05-30 LAB — CBC WITH DIFFERENTIAL (CANCER CENTER ONLY)
Abs Immature Granulocytes: 0.01 10*3/uL (ref 0.00–0.07)
Basophils Absolute: 0 10*3/uL (ref 0.0–0.1)
Basophils Relative: 1 %
Eosinophils Absolute: 0 10*3/uL (ref 0.0–0.5)
Eosinophils Relative: 1 %
HCT: 26.8 % — ABNORMAL LOW (ref 36.0–46.0)
Hemoglobin: 9 g/dL — ABNORMAL LOW (ref 12.0–15.0)
Immature Granulocytes: 0 %
Lymphocytes Relative: 12 %
Lymphs Abs: 0.3 10*3/uL — ABNORMAL LOW (ref 0.7–4.0)
MCH: 32.5 pg (ref 26.0–34.0)
MCHC: 33.6 g/dL (ref 30.0–36.0)
MCV: 96.8 fL (ref 80.0–100.0)
Monocytes Absolute: 0.2 10*3/uL (ref 0.1–1.0)
Monocytes Relative: 7 %
Neutro Abs: 2 10*3/uL (ref 1.7–7.7)
Neutrophils Relative %: 79 %
Platelet Count: 118 10*3/uL — ABNORMAL LOW (ref 150–400)
RBC: 2.77 MIL/uL — ABNORMAL LOW (ref 3.87–5.11)
RDW: 13.2 % (ref 11.5–15.5)
WBC Count: 2.6 10*3/uL — ABNORMAL LOW (ref 4.0–10.5)
nRBC: 0 % (ref 0.0–0.2)

## 2019-05-30 MED ORDER — SODIUM CHLORIDE 0.9 % IV SOLN
INTRAVENOUS | Status: DC
  Filled 2019-05-30 (×2): qty 250

## 2019-05-30 MED ORDER — FAMOTIDINE IN NACL 20-0.9 MG/50ML-% IV SOLN
INTRAVENOUS | Status: AC
  Filled 2019-05-30: qty 50

## 2019-05-30 MED ORDER — PALONOSETRON HCL INJECTION 0.25 MG/5ML
INTRAVENOUS | Status: AC
  Filled 2019-05-30: qty 5

## 2019-05-30 MED ORDER — SODIUM CHLORIDE 0.9 % IV SOLN
Freq: Once | INTRAVENOUS | Status: AC
  Filled 2019-05-30: qty 250

## 2019-05-30 MED ORDER — DIPHENHYDRAMINE HCL 50 MG/ML IJ SOLN
50.0000 mg | Freq: Once | INTRAMUSCULAR | Status: AC
  Administered 2019-05-30: 50 mg via INTRAVENOUS

## 2019-05-30 MED ORDER — SODIUM CHLORIDE 0.9 % IV SOLN
180.0000 mg | Freq: Once | INTRAVENOUS | Status: AC
  Administered 2019-05-30: 180 mg via INTRAVENOUS
  Filled 2019-05-30: qty 18

## 2019-05-30 MED ORDER — PALONOSETRON HCL INJECTION 0.25 MG/5ML
0.2500 mg | Freq: Once | INTRAVENOUS | Status: AC
  Administered 2019-05-30: 0.25 mg via INTRAVENOUS

## 2019-05-30 MED ORDER — FAMOTIDINE IN NACL 20-0.9 MG/50ML-% IV SOLN
20.0000 mg | Freq: Once | INTRAVENOUS | Status: AC
  Administered 2019-05-30: 20 mg via INTRAVENOUS

## 2019-05-30 MED ORDER — DIPHENHYDRAMINE HCL 50 MG/ML IJ SOLN
INTRAMUSCULAR | Status: AC
  Filled 2019-05-30: qty 1

## 2019-05-30 MED ORDER — SODIUM CHLORIDE 0.9 % IV SOLN
45.0000 mg/m2 | Freq: Once | INTRAVENOUS | Status: AC
  Administered 2019-05-30: 84 mg via INTRAVENOUS
  Filled 2019-05-30: qty 14

## 2019-05-30 MED ORDER — SODIUM CHLORIDE 0.9 % IV SOLN
20.0000 mg | Freq: Once | INTRAVENOUS | Status: AC
  Administered 2019-05-30: 20 mg via INTRAVENOUS
  Filled 2019-05-30: qty 20

## 2019-05-30 NOTE — Patient Instructions (Signed)
[facility] Discharge Instructions for Patients Receiving Chemotherapy  Today you received the following chemotherapy agents:  Taxol, Carboplatin  To help prevent nausea and vomiting after your treatment, we encourage you to take your nausea medication as prescribed.   If you develop nausea and vomiting that is not controlled by your nausea medication, call the clinic.   BELOW ARE SYMPTOMS THAT SHOULD BE REPORTED IMMEDIATELY:  *FEVER GREATER THAN 100.5 F  *CHILLS WITH OR WITHOUT FEVER  NAUSEA AND VOMITING THAT IS NOT CONTROLLED WITH YOUR NAUSEA MEDICATION  *UNUSUAL SHORTNESS OF BREATH  *UNUSUAL BRUISING OR BLEEDING  TENDERNESS IN MOUTH AND THROAT WITH OR WITHOUT PRESENCE OF ULCERS  *URINARY PROBLEMS  *BOWEL PROBLEMS  UNUSUAL RASH Items with * indicate a potential emergency and should be followed up as soon as possible.  Feel free to call the clinic should you have any questions or concerns. The clinic phone number is [phone].  Please show the CHEMO ALERT CARD at check-in to the Emergency Department and triage nurse.   

## 2019-05-30 NOTE — Progress Notes (Signed)
Per Dr Julien Nordmann OK to treat with low BP.  Pt to get 500 mls NS over 1 hr

## 2019-05-31 ENCOUNTER — Other Ambulatory Visit: Payer: Self-pay

## 2019-05-31 ENCOUNTER — Ambulatory Visit
Admission: RE | Admit: 2019-05-31 | Discharge: 2019-05-31 | Disposition: A | Discharge status: Home / Self Care | Payer: Medicare Other | Source: Ambulatory Visit | Attending: Radiation Oncology | Admitting: Radiation Oncology

## 2019-05-31 DIAGNOSIS — C3411 Malignant neoplasm of upper lobe, right bronchus or lung: Secondary | ICD-10-CM | POA: Diagnosis not present

## 2019-06-01 ENCOUNTER — Other Ambulatory Visit: Payer: Medicare Other

## 2019-06-01 ENCOUNTER — Ambulatory Visit
Admission: RE | Admit: 2019-06-01 | Discharge: 2019-06-01 | Disposition: A | Discharge status: Home / Self Care | Payer: Medicare Other | Source: Ambulatory Visit | Attending: Radiation Oncology | Admitting: Radiation Oncology

## 2019-06-01 ENCOUNTER — Ambulatory Visit: Payer: Medicare Other

## 2019-06-01 ENCOUNTER — Other Ambulatory Visit: Payer: Self-pay

## 2019-06-01 DIAGNOSIS — C3411 Malignant neoplasm of upper lobe, right bronchus or lung: Secondary | ICD-10-CM | POA: Diagnosis not present

## 2019-06-02 ENCOUNTER — Ambulatory Visit
Admission: RE | Admit: 2019-06-02 | Discharge: 2019-06-02 | Disposition: A | Discharge status: Home / Self Care | Payer: Medicare Other | Source: Ambulatory Visit | Attending: Radiation Oncology | Admitting: Radiation Oncology

## 2019-06-02 ENCOUNTER — Other Ambulatory Visit: Payer: Self-pay

## 2019-06-02 DIAGNOSIS — C3411 Malignant neoplasm of upper lobe, right bronchus or lung: Secondary | ICD-10-CM | POA: Diagnosis not present

## 2019-06-03 ENCOUNTER — Ambulatory Visit
Admission: RE | Admit: 2019-06-03 | Discharge: 2019-06-03 | Disposition: A | Discharge status: Home / Self Care | Payer: Medicare Other | Source: Ambulatory Visit | Attending: Radiation Oncology | Admitting: Radiation Oncology

## 2019-06-03 ENCOUNTER — Other Ambulatory Visit: Payer: Self-pay

## 2019-06-03 DIAGNOSIS — C3411 Malignant neoplasm of upper lobe, right bronchus or lung: Secondary | ICD-10-CM | POA: Diagnosis not present

## 2019-06-04 NOTE — Progress Notes (Signed)
  Radiation Oncology         ([phone]) [phone] ________________________________  Name: Betty Anderson MRN: [id number]  Date: 04/26/2019  DOB: November 19, 1955  SIMULATION AND TREATMENT PLANNING NOTE  DIAGNOSIS:     ICD-10-CM   1. Malignant neoplasm of upper lobe of right lung [facility])  C34.11      Site:  chest  NARRATIVE:  The patient was brought to the [place].  Identity was confirmed.  All relevant records and images related to the planned course of therapy were reviewed.   Written consent to proceed with treatment was confirmed which was freely given after reviewing the details related to the planned course of therapy had been reviewed with the patient.  Then, the patient was set-up in a stable reproducible  supine position for radiation therapy.  CT images were obtained.  Surface markings were placed.    Medically necessary complex treatment device(s) for immobilization:  Vac-lock bag.   The CT images were loaded into the planning software.  Then the target and avoidance structures were contoured.  Treatment planning then occurred.  The radiation prescription was entered and confirmed.  Complex treatment devices were fabricated which relate to the designed radiation treatment fields for the tomotherapy 3D treatment plan. The total number of fields will be determined by the tomotherapy sinogram. Each of these customized fields/ complex treatment devices will be used on a daily basis during the radiation course. I have requested : 3D Simulation  I have requested a DVH of the following structures: Target volume, lungs, heart, spinal cord.   The patient will undergo daily image guidance to ensure accurate localization of the target, and adequate minimize dose to the normal surrounding structures in close proximity to the target.   PLAN:  The patient will receive 60 Gy in 30 fractions initially. The patient will then receive a 6 gray boost for a final total dose of 66 Gy.    Special treatment procedure The patient will also receive concurrent chemotherapy during the treatment. The patient may therefore experience increased toxicity or side effects and the patient will be monitored for such problems. This may require extra lab work as necessary. This therefore constitutes a special treatment procedure.   ________________________________   Jodelle Gross, MD, PhD

## 2019-06-04 NOTE — Progress Notes (Signed)
  Radiation Oncology         [phone] ________________________________  Name: Betty Anderson MRN: [id number]  Date: 04/26/2019  DOB: [birth year]  RESPIRATORY MOTION MANAGEMENT SIMULATION  NARRATIVE:  In order to account for effect of respiratory motion on target structures and other organs in the planning and delivery of radiotherapy, this patient underwent respiratory motion management simulation.  To accomplish this, when the patient was brought to the CT simulation planning suite, 4D respiratoy motion management CT images were obtained.  The CT images were loaded into the planning software.  Then, using a variety of tools including Cine, MIP, and standard views, the target volume and planning target volumes (PTV) were delineated.  Avoidance structures were contoured.  Treatment planning then occurred.  Dose volume histograms were generated and reviewed for each of the requested structure.  The resulting plan was carefully reviewed and approved today.   ------------------------------------------------  Jodelle Gross, MD, PhD

## 2019-06-05 NOTE — Progress Notes (Signed)
[place] OFFICE PROGRESS NOTE  [name], MD [address] [place] [address]  DIAGNOSIS: Stage IIIA (T1b, N2, M0) non-small cell lung cancer presented with right upper lobe lung nodule in addition to mediastinal lymphadenopathy diagnosed in January 2021.  PRIOR THERAPY: None.  CURRENT THERAPY: Concurrent chemoradiation with weekly carboplatin for AUC of 2 and paclitaxel 45 NG/M2.  Status post 5 cycles.  INTERVAL HISTORY: Betty Anderson 64 y.o. female returns to the clinic for a follow up visit. The patient is feeling fatigued today. She also endorses odynophagia and dysphagia for which she was prescribed carafate. The patient's last radiation treatment is scheduled for 3/18. She lost 1 lb since her last appointment. She tries to drink 80 ounces of water daily as well as fairlife milk. Her PCP recently D/Ced her BP medications due to hypotension. Denies any fever, chills, or night sweats. Denies any hemoptysis. She reports her baseline smokers cough. She used to smoke about 3 packs per day and states she is down to two packs per day. Denies any nausea, vomiting, diarrhea, or constipation. Denies any headache or visual changes. The patient is here today for evaluation prior to starting cycle # 6  MEDICAL HISTORY: Past Medical History:  Diagnosis Date  . COPD (chronic obstructive pulmonary disease) (HCC)    emphysema  . Hyperlipidemia   . Hypertension   . Lung nodule   . Paroxysmal atrial flutter ([place])    per cardiology note  . Peripheral artery disease ([place])   . Tobacco abuse   . Vitamin D deficiency   . Wears glasses     ALLERGIES:  has No Known Allergies.  MEDICATIONS:  Current Outpatient Medications  Medication Sig Dispense Refill  . aspirin EC 81 MG tablet Take 1 tablet (81 mg total) by mouth daily. 90 tablet 3  . Cholecalciferol (DIALYVITE VITAMIN D 5000) 125 MCG (5000 UT) capsule Take 5,000 Units by mouth daily.    Marland Kitchen escitalopram (LEXAPRO) 10 MG  tablet Take 10 mg by mouth daily.    . rosuvastatin (CRESTOR) 20 MG tablet Take 20 mg by mouth every evening.     . sucralfate (CARAFATE) 1 g tablet Take 1 tablet (1 g total) by mouth 4 (four) times daily. Dissolve each tablet in 15 cc water before use. 120 tablet 2  . vitamin B-12 (CYANOCOBALAMIN) 1000 MCG tablet Take 1,000 mcg by mouth daily.    . prochlorperazine (COMPAZINE) 10 MG tablet Take 1 tablet (10 mg total) by mouth every 6 (six) hours as needed for nausea or vomiting. (Patient not taking: Reported on 05/23/2019) 30 tablet 0   No current facility-administered medications for this visit.    SURGICAL HISTORY:  Past Surgical History:  Procedure Laterality Date  . BRONCHIAL NEEDLE ASPIRATION BIOPSY  04/12/2019   Procedure: BRONCHIAL NEEDLE ASPIRATION BIOPSIES;  Surgeon: Garner Nash, DO;  Location: [facility];  Service: Cardiopulmonary;;  . ENDOBRONCHIAL ULTRASOUND N/A 04/12/2019   Procedure: ENDOBRONCHIAL ULTRASOUND;  Surgeon: Garner Nash, DO;  Location: [facility];  Service: Cardiopulmonary;  Laterality: N/A;  . OVARIAN CYST REMOVAL    . TONSILLECTOMY    . TUBAL LIGATION    . VIDEO BRONCHOSCOPY N/A 04/12/2019   Procedure: VIDEO BRONCHOSCOPY WITHOUT FLUORO;  Surgeon: Garner Nash, DO;  Location: [facility];  Service: Cardiopulmonary;  Laterality: N/A;    REVIEW OF SYSTEMS:   Review of Systems  Constitutional: Positive for fatigue and decreased appetite secondary to pain. Negative for chills, fever and unexpected  weight change.  HENT:  Positive for odynophagia and dysphagia. Negative for mouth sores and nosebleeds.   Eyes: Negative for eye problems and icterus.  Respiratory: Positive for baseline cough and shortness of breath with exertion.  Negative for hemoptysis and wheezing.  Cardiovascular: Negative for chest pain and leg swelling.  Gastrointestinal: Negative for abdominal pain, constipation, diarrhea, nausea and vomiting.  Genitourinary: Negative for bladder  incontinence, difficulty urinating, dysuria, frequency and hematuria.   Musculoskeletal: Negative for back pain, gait problem, neck pain and neck stiffness.  Skin: Negative for itching and rash.  Neurological: Negative for dizziness, extremity weakness, gait problem, headaches, light-headedness and seizures.  Hematological: Negative for adenopathy. Does not bruise/bleed easily.  Psychiatric/Behavioral: Negative for confusion, depression and sleep disturbance. The patient is not nervous/anxious.     PHYSICAL EXAMINATION:  Blood pressure (!) 79/65, pulse 96, temperature 98.5 F (36.9 C), temperature source Temporal, resp. rate 18, height 5\' 2"  (1.575 m), weight 164 lb 11.2 oz (74.7 kg), SpO2 100 %.  ECOG PERFORMANCE STATUS: 1 - Symptomatic but completely ambulatory  Physical Exam  Constitutional: Oriented to person, place, and time and chronically ill appearing female and in no distress.  HENT:  Head: Normocephalic and atraumatic.  Mouth/Throat: Oropharynx is clear and moist. No oropharyngeal exudate.  Eyes: Conjunctivae are normal. Right eye exhibits no discharge. Left eye exhibits no discharge. No scleral icterus.  Neck: Normal range of motion. Neck supple.  Cardiovascular: Normal rate, regular rhythm, normal heart sounds and intact distal pulses.   Pulmonary/Chest: Effort normal and breath sounds normal. No respiratory distress. No wheezes. No rales.  Abdominal: Soft. Bowel sounds are normal. Exhibits no distension and no mass. There is no tenderness.  Musculoskeletal: Normal range of motion. Exhibits no edema.  Lymphadenopathy:    No cervical adenopathy.  Neurological: Alert and oriented to person, place, and time. Exhibits normal muscle tone. Gait normal. Coordination normal.  Skin: Skin is warm and dry. No rash noted. Not diaphoretic. No erythema. No pallor.  Psychiatric: Mood, memory and judgment normal.  Vitals reviewed.  LABORATORY DATA: Lab Results  Component Value Date    WBC 1.5 (L) 06/06/2019   HGB 7.8 (L) 06/06/2019   HCT 23.1 (L) 06/06/2019   MCV 95.9 06/06/2019   PLT 69 (L) 06/06/2019      Chemistry      Component Value Date/Time   NA 139 06/06/2019 0811   K 3.9 06/06/2019 0811   CL 108 06/06/2019 0811   CO2 23 06/06/2019 0811   BUN 13 06/06/2019 0811   CREATININE 0.98 06/06/2019 0811      Component Value Date/Time   CALCIUM 8.8 (L) 06/06/2019 0811   ALKPHOS 82 06/06/2019 0811   AST 13 (L) 06/06/2019 0811   ALT 13 06/06/2019 0811   BILITOT 0.4 06/06/2019 0811       RADIOGRAPHIC STUDIES:  No results found.   ASSESSMENT/PLAN:  This is a very pleasant 64 year old Caucasian female diagnosed with stage IIIa non-small cell lung cancer.  She presented with a right upper lobe lung mass and mediastinal lymphadenopathy.  She was diagnosed in January 2021.  She is currently undergoing concurrent chemoradiation with weekly carboplatin for an AUC of 2 and paclitaxel 45 mg/m.  She is status post 5 cycles.   Labs were reviewed. Her platelets are low at 65k today. Her Hgb is 7.8 and she is syptomatic. She will not receive cycle #6 of chemotherapy today, instead, she will receive 1L of normal saline and 1 unit of  blood. She is hypotensive, advised her to continue to drink plenty of fluids at home. We will reach out to radiation oncology for additional recommendations regarding her sore throat.   Her last radiation treatment is scheduled for 3/18. Her last dose of weekly chemotherapy is scheduled for 3/15, she will proceed with next weeks dose as planned as long as her labs are adequate.   We will see the patient back for follow-up visit in 4 weeks with a restaging CT scan of the chest.   She is aware of the importance of smoking cessation and is trying to cut back.   The patient was advised to call immediately if she has any concerning symptoms in the interval. The patient voices understanding of current disease status and treatment options and is  in agreement with the current care plan. All questions were answered. The patient knows to call the clinic with any problems, questions or concerns. We can certainly see the patient much sooner if necessary      Orders Placed This Encounter  Procedures  . CT Chest W Contrast    Standing Status:   Future    Standing Expiration Date:   06/05/2020    Order Specific Question:   ** REASON FOR EXAM (FREE TEXT)    Answer:   Restaging Lung Cancer    Order Specific Question:   If indicated for the ordered procedure, I authorize the administration of contrast media per Radiology protocol    Answer:   Yes    Order Specific Question:   Preferred imaging location?    Answer:   [facility]    Order Specific Question:   Radiology Contrast Protocol - do NOT remove file path    Answer:   \\[email or web address]  . Practitioner attestation of consent    I, the ordering practitioner, attest that I have discussed with the patient the benefits, risks, side effects, alternatives, likelihood of achieving goals and potential problems during recovery for the procedure listed.    Standing Status:   Future    Standing Expiration Date:   06/05/2020    Order Specific Question:   Procedure    Answer:   Blood Product(s)  . Complete patient signature process for consent form    Standing Status:   Future    Standing Expiration Date:   06/05/2020  . Care order/instruction    Transfuse Parameters    Standing Status:   Future    Standing Expiration Date:   06/05/2020  . Type and screen    Standing Status:   Future    Standing Expiration Date:   06/05/2020     Kateland Leisinger L Jolin Benavides, PA-C 06/06/19

## 2019-06-06 ENCOUNTER — Inpatient Hospital Stay (HOSPITAL_BASED_OUTPATIENT_CLINIC_OR_DEPARTMENT_OTHER): Payer: Medicare Other | Admitting: Physician Assistant

## 2019-06-06 ENCOUNTER — Inpatient Hospital Stay: Payer: Medicare Other

## 2019-06-06 ENCOUNTER — Telehealth: Payer: Self-pay | Admitting: *Deleted

## 2019-06-06 ENCOUNTER — Other Ambulatory Visit: Payer: Self-pay | Admitting: Radiation Oncology

## 2019-06-06 ENCOUNTER — Ambulatory Visit
Admission: RE | Admit: 2019-06-06 | Discharge: 2019-06-06 | Disposition: A | Discharge status: Home / Self Care | Payer: Medicare Other | Source: Ambulatory Visit | Attending: Radiation Oncology | Admitting: Radiation Oncology

## 2019-06-06 ENCOUNTER — Encounter: Payer: Self-pay | Admitting: Physician Assistant

## 2019-06-06 ENCOUNTER — Other Ambulatory Visit: Payer: Self-pay

## 2019-06-06 VITALS — BP 79/65 | HR 96 | Temp 98.5°F | Resp 18 | Ht 62.0 in | Wt 164.7 lb

## 2019-06-06 DIAGNOSIS — T451X5A Adverse effect of antineoplastic and immunosuppressive drugs, initial encounter: Secondary | ICD-10-CM

## 2019-06-06 DIAGNOSIS — I959 Hypotension, unspecified: Secondary | ICD-10-CM | POA: Diagnosis not present

## 2019-06-06 DIAGNOSIS — F17200 Nicotine dependence, unspecified, uncomplicated: Secondary | ICD-10-CM | POA: Diagnosis not present

## 2019-06-06 DIAGNOSIS — C3411 Malignant neoplasm of upper lobe, right bronchus or lung: Secondary | ICD-10-CM

## 2019-06-06 DIAGNOSIS — Z5111 Encounter for antineoplastic chemotherapy: Secondary | ICD-10-CM | POA: Diagnosis not present

## 2019-06-06 DIAGNOSIS — D6481 Anemia due to antineoplastic chemotherapy: Secondary | ICD-10-CM

## 2019-06-06 LAB — CBC WITH DIFFERENTIAL (CANCER CENTER ONLY)
Abs Immature Granulocytes: 0.01 10*3/uL (ref 0.00–0.07)
Basophils Absolute: 0 10*3/uL (ref 0.0–0.1)
Basophils Relative: 1 %
Eosinophils Absolute: 0 10*3/uL (ref 0.0–0.5)
Eosinophils Relative: 3 %
HCT: 23.1 % — ABNORMAL LOW (ref 36.0–46.0)
Hemoglobin: 7.8 g/dL — ABNORMAL LOW (ref 12.0–15.0)
Immature Granulocytes: 1 %
Lymphocytes Relative: 15 %
Lymphs Abs: 0.2 10*3/uL — ABNORMAL LOW (ref 0.7–4.0)
MCH: 32.4 pg (ref 26.0–34.0)
MCHC: 33.8 g/dL (ref 30.0–36.0)
MCV: 95.9 fL (ref 80.0–100.0)
Monocytes Absolute: 0.1 10*3/uL (ref 0.1–1.0)
Monocytes Relative: 3 %
Neutro Abs: 1.2 10*3/uL — ABNORMAL LOW (ref 1.7–7.7)
Neutrophils Relative %: 77 %
Platelet Count: 69 10*3/uL — ABNORMAL LOW (ref 150–400)
RBC: 2.41 MIL/uL — ABNORMAL LOW (ref 3.87–5.11)
RDW: 13 % (ref 11.5–15.5)
WBC Count: 1.5 10*3/uL — ABNORMAL LOW (ref 4.0–10.5)
nRBC: 0 % (ref 0.0–0.2)

## 2019-06-06 LAB — ABO/RH: ABO/RH(D): O POS

## 2019-06-06 LAB — CMP (CANCER CENTER ONLY)
ALT: 13 U/L (ref 0–44)
AST: 13 U/L — ABNORMAL LOW (ref 15–41)
Albumin: 3.2 g/dL — ABNORMAL LOW (ref 3.5–5.0)
Alkaline Phosphatase: 82 U/L (ref 38–126)
Anion gap: 8 (ref 5–15)
BUN: 13 mg/dL (ref 8–23)
CO2: 23 mmol/L (ref 22–32)
Calcium: 8.8 mg/dL — ABNORMAL LOW (ref 8.9–10.3)
Chloride: 108 mmol/L (ref 98–111)
Creatinine: 0.98 mg/dL (ref 0.44–1.00)
GFR, Est AFR Am: >60 mL/min
GFR, Estimated: >60 mL/min
Glucose, Bld: 92 mg/dL (ref 70–99)
Potassium: 3.9 mmol/L (ref 3.5–5.1)
Sodium: 139 mmol/L (ref 135–145)
Total Bilirubin: 0.4 mg/dL (ref 0.3–1.2)
Total Protein: 6.8 g/dL (ref 6.5–8.1)

## 2019-06-06 LAB — PREPARE RBC (CROSSMATCH)

## 2019-06-06 MED ORDER — SODIUM CHLORIDE 0.9% IV SOLUTION
250.0000 mL | Freq: Once | INTRAVENOUS | Status: AC
  Administered 2019-06-06: 250 mL via INTRAVENOUS
  Filled 2019-06-06: qty 250

## 2019-06-06 MED ORDER — DIPHENHYDRAMINE HCL 25 MG PO CAPS
ORAL_CAPSULE | ORAL | Status: AC
  Filled 2019-06-06: qty 1

## 2019-06-06 MED ORDER — DIPHENHYDRAMINE HCL 25 MG PO CAPS
25.0000 mg | ORAL_CAPSULE | Freq: Once | ORAL | Status: AC
  Administered 2019-06-06: 25 mg via ORAL

## 2019-06-06 MED ORDER — ACETAMINOPHEN 325 MG PO TABS
650.0000 mg | ORAL_TABLET | Freq: Once | ORAL | Status: AC
  Administered 2019-06-06: 650 mg via ORAL

## 2019-06-06 MED ORDER — SODIUM CHLORIDE 0.9 % IV SOLN
Freq: Once | INTRAVENOUS | Status: AC
  Filled 2019-06-06: qty 250

## 2019-06-06 MED ORDER — ACETAMINOPHEN 325 MG PO TABS
ORAL_TABLET | ORAL | Status: AC
  Filled 2019-06-06: qty 2

## 2019-06-06 MED ORDER — HYDROCODONE-ACETAMINOPHEN 7.5-325 MG/15ML PO SOLN: 10.0000 mL | Freq: Four times a day (QID) | ORAL | 0 refills | Status: DC | PRN

## 2019-06-06 NOTE — Telephone Encounter (Signed)
See progress note.

## 2019-06-06 NOTE — Telephone Encounter (Signed)
Spoke with the patient to let her know that we have sent a prescription for Hycet to her pharmacy to help with her swallowing.  She was encouraged to continue the carafate and to try mylanta.  She stated mylanta makes her sick so was advised to avoid that.  I let her know that we would follow-up with her to make sure the medication was helping her later in the week.  She verbalized understanding.  Will continue to follow as necessary.   Gloriajean Dell. Leonie Green, BSN

## 2019-06-06 NOTE — Patient Instructions (Signed)
Blood Transfusion, Adult, Care After This sheet gives you information about how to care for yourself after your procedure. Your doctor may also give you more specific instructions. If you have problems or questions, contact your doctor. What can I expect after the procedure? After the procedure, it is common to have:  Bruising and soreness at the IV site.  A fever or chills on the day of the procedure. This may be your body's response to the new blood cells received.  A headache. Follow these instructions at home: Insertion site care      Follow instructions from your doctor about how to take care of your insertion site. This is where an IV tube was put into your vein. Make sure you: ? Wash your hands with soap and water before and after you change your bandage (dressing). If you cannot use soap and water, use hand sanitizer. ? Change your bandage as told by your doctor.  Check your insertion site every day for signs of infection. Check for: ? Redness, swelling, or pain. ? Bleeding from the site. ? Warmth. ? Pus or a bad smell. General instructions  Take over-the-counter and prescription medicines only as told by your doctor.  Rest as told by your doctor.  Go back to your normal activities as told by your doctor.  Keep all follow-up visits as told by your doctor. This is important. Contact a doctor if:  You have itching or red, swollen areas of skin (hives).  You feel worried or nervous (anxious).  You feel weak after doing your normal activities.  You have redness, swelling, warmth, or pain around the insertion site.  You have blood coming from the insertion site, and the blood does not stop with pressure.  You have pus or a bad smell coming from the insertion site. Get help right away if:  You have signs of a serious reaction. This may be coming from an allergy or the body's defense system (immune system). Signs include: ? Trouble breathing or shortness of  breath. ? Swelling of the face or feeling warm (flushed). ? Fever or chills. ? Head, chest, or back pain. ? Dark pee (urine) or blood in the pee. ? Widespread rash. ? Fast heartbeat. ? Feeling dizzy or light-headed. You may receive your blood transfusion in an outpatient setting. If so, you will be told whom to contact to report any reactions. These symptoms may be an emergency. Do not wait to see if the symptoms will go away. Get medical help right away. Call your local emergency services (911 in the [place] Do not drive yourself to the hospital. Summary  Bruising and soreness at the IV site are common.  Check your insertion site every day for signs of infection.  Rest as told by your doctor. Go back to your normal activities as told by your doctor.  Get help right away if you have signs of a serious reaction. This information is not intended to replace advice given to you by your health care provider. Make sure you discuss any questions you have with your health care provider. Document Revised: 09/09/2018 Document Reviewed: 09/09/2018 Elsevier Patient Education  [place].   Rehydration, Adult Rehydration is the replacement of body fluids and salts and minerals (electrolytes) that are lost during dehydration. Dehydration is when there is not enough fluid or water in the body. This happens when you lose more fluids than you take in. Common causes of dehydration include:  Vomiting.  Diarrhea.  Excessive  sweating, such as from heat exposure or exercise.  Taking medicines that cause the body to lose excess fluid (diuretics).  Impaired kidney function.  Not drinking enough fluid.  Certain illnesses or infections.  Certain poorly controlled long-term (chronic) illnesses, such as diabetes, heart disease, and kidney disease.  Symptoms of mild dehydration may include thirst, dry lips and mouth, dry skin, and dizziness. Symptoms of severe dehydration may include increased  heart rate, confusion, fainting, and not urinating. You can rehydrate by drinking certain fluids or getting fluids through an IV tube, as told by your health care provider. What are the risks? Generally, rehydration is safe. However, one problem that can happen is taking in too much fluid (overhydration). This is rare. If overhydration happens, it can cause an electrolyte imbalance, kidney failure, or a decrease in salt (sodium) levels in the body. How to rehydrate Follow instructions from your health care provider for rehydration. The kind of fluid you should drink and the amount you should drink depend on your condition.  If directed by your health care provider, drink an oral rehydration solution (ORS). This is a drink designed to treat dehydration that is found in pharmacies and retail stores. ? Make an ORS by following instructions on the package. ? Start by drinking small amounts, about  cup (120 mL) every 5-10 minutes. ? Slowly increase how much you drink until you have taken the amount recommended by your health care provider.  Drink enough clear fluids to keep your urine clear or pale yellow. If you were instructed to drink an ORS, finish the ORS first, then start slowly drinking other clear fluids. Drink fluids such as: ? Water. Do not drink only water. Doing that can lead to having too little sodium in your body (hyponatremia). ? Ice chips. ? Fruit juice that you have added water to (diluted juice). ? Low-calorie sports drinks.  If you are severely dehydrated, your health care provider may recommend that you receive fluids through an IV tube in the hospital.  Do not take sodium tablets. Doing that can lead to the condition of having too much sodium in your body (hypernatremia). Eating while you rehydrate Follow instructions from your health care provider about what to eat while you rehydrate. Your health care provider may recommend that you slowly begin eating regular foods in small  amounts.  Eat foods that contain a healthy balance of electrolytes, such as bananas, oranges, potatoes, tomatoes, and spinach.  Avoid foods that are greasy or contain a lot of fat or sugar.  In some cases, you may get nutrition through a feeding tube that is passed through your nose and into your stomach (nasogastric tube, or NG tube). This may be done if you have uncontrolled vomiting or diarrhea. Beverages to avoid Certain beverages may make dehydration worse. While you rehydrate, avoid:  Alcohol.  Caffeine.  Drinks that contain a lot of sugar. These include: ? High-calorie sports drinks. ? Fruit juice that is not diluted. ? Soda.  Check nutrition labels to see how much sugar or caffeine a beverage contains. Signs of dehydration recovery You may be recovering from dehydration if:  You are urinating more often than before you started rehydrating.  Your urine is clear or pale yellow.  Your energy level improves.  You vomit less frequently.  You have diarrhea less frequently.  Your appetite improves or returns to normal.  You feel less dizzy or less light-headed.  Your skin tone and color start to look more normal. Contact  a health care provider if:  You continue to have symptoms of mild dehydration, such as: ? Thirst. ? Dry lips. ? Slightly dry mouth. ? Dry, warm skin. ? Dizziness.  You continue to vomit or have diarrhea. Get help right away if:  You have symptoms of dehydration that get worse.  You feel: ? Confused. ? Weak. ? Like you are going to faint.  You have not urinated in 6-8 hours.  You have very dark urine.  You have trouble breathing.  Your heart rate while sitting still is over 100 beats a minute.  You cannot drink fluids without vomiting.  You have vomiting or diarrhea that: ? Gets worse. ? Does not go away.  You have a fever. This information is not intended to replace advice given to you by your health care provider. Make sure  you discuss any questions you have with your health care provider. Document Revised: 02/27/2017 Document Reviewed: 05/11/2015 Elsevier Patient Education  2020 [employer].

## 2019-06-06 NOTE — Progress Notes (Signed)
Pt is 24/33 fxns of XRT to the chest complaining of progressive esophagitis to nursing. She is using carafate as prescribed. Recommended mylanta prn, cooking oil for coating pills and by tablespoon prn prior to eating if desires and new rx for hycet was called to her pharmacy for pain management. We will follow expectantly.

## 2019-06-06 NOTE — Progress Notes (Signed)
Patient presents today for MD visit.  Complaining of pain with swallowing, describes "feels like swallowing glass", at worst times pain reported 8 out of 10.    Routed to [facility] to evaluate for further medication to control or manage pain induced by Radiation that patient is receiving.

## 2019-06-07 ENCOUNTER — Ambulatory Visit
Admission: RE | Admit: 2019-06-07 | Discharge: 2019-06-07 | Disposition: A | Discharge status: Home / Self Care | Payer: Medicare Other | Source: Ambulatory Visit | Attending: Radiation Oncology | Admitting: Radiation Oncology

## 2019-06-07 ENCOUNTER — Other Ambulatory Visit: Payer: Self-pay

## 2019-06-07 ENCOUNTER — Ambulatory Visit: Payer: Medicare Other | Admitting: Internal Medicine

## 2019-06-07 ENCOUNTER — Other Ambulatory Visit: Payer: Medicare Other

## 2019-06-07 ENCOUNTER — Telehealth: Payer: Self-pay | Admitting: Physician Assistant

## 2019-06-07 DIAGNOSIS — C3411 Malignant neoplasm of upper lobe, right bronchus or lung: Secondary | ICD-10-CM | POA: Diagnosis not present

## 2019-06-07 LAB — BPAM RBC
Blood Product Expiration Date: 202104082359
ISSUE DATE / TIME: 202103081158
Unit Type and Rh: 5100

## 2019-06-07 LAB — TYPE AND SCREEN
ABO/RH(D): O POS
Antibody Screen: NEGATIVE
Unit division: 0

## 2019-06-07 NOTE — Telephone Encounter (Signed)
Scheduled per los. Called and spoke with patient. Confirmed appt 

## 2019-06-08 ENCOUNTER — Ambulatory Visit
Admission: RE | Admit: 2019-06-08 | Discharge: 2019-06-08 | Disposition: A | Discharge status: Home / Self Care | Payer: Medicare Other | Source: Ambulatory Visit | Attending: Radiation Oncology | Admitting: Radiation Oncology

## 2019-06-08 ENCOUNTER — Ambulatory Visit: Payer: Medicare Other

## 2019-06-08 ENCOUNTER — Other Ambulatory Visit: Payer: Self-pay

## 2019-06-08 DIAGNOSIS — C3411 Malignant neoplasm of upper lobe, right bronchus or lung: Secondary | ICD-10-CM | POA: Diagnosis not present

## 2019-06-09 ENCOUNTER — Other Ambulatory Visit: Payer: Self-pay

## 2019-06-09 ENCOUNTER — Ambulatory Visit
Admission: RE | Admit: 2019-06-09 | Discharge: 2019-06-09 | Disposition: A | Discharge status: Home / Self Care | Payer: Medicare Other | Source: Ambulatory Visit | Attending: Radiation Oncology | Admitting: Radiation Oncology

## 2019-06-09 DIAGNOSIS — C3411 Malignant neoplasm of upper lobe, right bronchus or lung: Secondary | ICD-10-CM | POA: Diagnosis not present

## 2019-06-10 ENCOUNTER — Ambulatory Visit
Admission: RE | Admit: 2019-06-10 | Discharge: 2019-06-10 | Disposition: A | Discharge status: Home / Self Care | Payer: Medicare Other | Source: Ambulatory Visit | Attending: Radiation Oncology | Admitting: Radiation Oncology

## 2019-06-10 ENCOUNTER — Other Ambulatory Visit: Payer: Self-pay

## 2019-06-10 DIAGNOSIS — C3411 Malignant neoplasm of upper lobe, right bronchus or lung: Secondary | ICD-10-CM | POA: Diagnosis not present

## 2019-06-13 ENCOUNTER — Other Ambulatory Visit: Payer: Self-pay

## 2019-06-13 ENCOUNTER — Ambulatory Visit: Payer: Medicare Other

## 2019-06-13 ENCOUNTER — Inpatient Hospital Stay: Payer: Medicare Other

## 2019-06-13 ENCOUNTER — Ambulatory Visit
Admission: RE | Admit: 2019-06-13 | Discharge: 2019-06-13 | Disposition: A | Discharge status: Home / Self Care | Payer: Medicare Other | Source: Ambulatory Visit | Attending: Radiation Oncology | Admitting: Radiation Oncology

## 2019-06-13 VITALS — BP 70/61 | HR 89 | Temp 97.8°F | Resp 18

## 2019-06-13 DIAGNOSIS — C3411 Malignant neoplasm of upper lobe, right bronchus or lung: Secondary | ICD-10-CM | POA: Diagnosis not present

## 2019-06-13 DIAGNOSIS — Z5111 Encounter for antineoplastic chemotherapy: Secondary | ICD-10-CM | POA: Diagnosis not present

## 2019-06-13 LAB — CMP (CANCER CENTER ONLY)
ALT: 8 U/L (ref 0–44)
AST: 13 U/L — ABNORMAL LOW (ref 15–41)
Albumin: 3.3 g/dL — ABNORMAL LOW (ref 3.5–5.0)
Alkaline Phosphatase: 88 U/L (ref 38–126)
Anion gap: 12 (ref 5–15)
BUN: 14 mg/dL (ref 8–23)
CO2: 22 mmol/L (ref 22–32)
Calcium: 9.2 mg/dL (ref 8.9–10.3)
Chloride: 106 mmol/L (ref 98–111)
Creatinine: 0.93 mg/dL (ref 0.44–1.00)
GFR, Est AFR Am: >60 mL/min (ref >60)
GFR, Estimated: >60 mL/min (ref >60)
Glucose, Bld: 84 mg/dL (ref 70–99)
Potassium: 3.6 mmol/L (ref 3.5–5.1)
Sodium: 140 mmol/L (ref 135–145)
Total Bilirubin: 0.5 mg/dL (ref 0.3–1.2)
Total Protein: 7.3 g/dL (ref 6.5–8.1)

## 2019-06-13 LAB — CBC WITH DIFFERENTIAL (CANCER CENTER ONLY)
Abs Immature Granulocytes: 0 10*3/uL (ref 0.00–0.07)
Basophils Absolute: 0 10*3/uL (ref 0.0–0.1)
Basophils Relative: 1 %
Eosinophils Absolute: 0.1 10*3/uL (ref 0.0–0.5)
Eosinophils Relative: 4 %
HCT: 27.4 % — ABNORMAL LOW (ref 36.0–46.0)
Hemoglobin: 9.4 g/dL — ABNORMAL LOW (ref 12.0–15.0)
Immature Granulocytes: 0 %
Lymphocytes Relative: 16 %
Lymphs Abs: 0.2 10*3/uL — ABNORMAL LOW (ref 0.7–4.0)
MCH: 32.5 pg (ref 26.0–34.0)
MCHC: 34.3 g/dL (ref 30.0–36.0)
MCV: 94.8 fL (ref 80.0–100.0)
Monocytes Absolute: 0.2 10*3/uL (ref 0.1–1.0)
Monocytes Relative: 11 %
Neutro Abs: 1 10*3/uL — ABNORMAL LOW (ref 1.7–7.7)
Neutrophils Relative %: 68 %
Platelet Count: 183 10*3/uL (ref 150–400)
RBC: 2.89 MIL/uL — ABNORMAL LOW (ref 3.87–5.11)
RDW: 13.6 % (ref 11.5–15.5)
WBC Count: 1.4 10*3/uL — ABNORMAL LOW (ref 4.0–10.5)
nRBC: 0 % (ref 0.0–0.2)

## 2019-06-13 MED ORDER — PALONOSETRON HCL INJECTION 0.25 MG/5ML
INTRAVENOUS | Status: AC
  Filled 2019-06-13: qty 5

## 2019-06-13 MED ORDER — SODIUM CHLORIDE 0.9 % IV SOLN
Freq: Once | INTRAVENOUS | Status: AC
  Filled 2019-06-13: qty 250

## 2019-06-13 MED ORDER — FAMOTIDINE IN NACL 20-0.9 MG/50ML-% IV SOLN
INTRAVENOUS | Status: AC
  Filled 2019-06-13: qty 50

## 2019-06-13 MED ORDER — DIPHENHYDRAMINE HCL 50 MG/ML IJ SOLN
INTRAMUSCULAR | Status: AC
  Filled 2019-06-13: qty 1

## 2019-06-13 NOTE — Progress Notes (Signed)
Neut. 1.0 today. Not treatment today, per Dr. Julien Nordmann.

## 2019-06-14 ENCOUNTER — Ambulatory Visit: Payer: Medicare Other

## 2019-06-14 ENCOUNTER — Ambulatory Visit
Admission: RE | Admit: 2019-06-14 | Discharge: 2019-06-14 | Disposition: A | Discharge status: Home / Self Care | Payer: Medicare Other | Source: Ambulatory Visit | Attending: Radiation Oncology | Admitting: Radiation Oncology

## 2019-06-14 ENCOUNTER — Other Ambulatory Visit: Payer: Self-pay | Admitting: Radiation Oncology

## 2019-06-14 ENCOUNTER — Other Ambulatory Visit: Payer: Self-pay

## 2019-06-14 DIAGNOSIS — C3411 Malignant neoplasm of upper lobe, right bronchus or lung: Secondary | ICD-10-CM | POA: Diagnosis not present

## 2019-06-14 MED ORDER — HYDROCODONE-ACETAMINOPHEN 7.5-325 MG/15ML PO SOLN: 10.0000 mL | Freq: Four times a day (QID) | ORAL | 0 refills | Status: DC | PRN

## 2019-06-15 ENCOUNTER — Ambulatory Visit: Payer: Medicare Other

## 2019-06-15 ENCOUNTER — Other Ambulatory Visit: Payer: Self-pay

## 2019-06-15 ENCOUNTER — Ambulatory Visit
Admission: RE | Admit: 2019-06-15 | Discharge: 2019-06-15 | Disposition: A | Discharge status: Home / Self Care | Payer: Medicare Other | Source: Ambulatory Visit | Attending: Radiation Oncology | Admitting: Radiation Oncology

## 2019-06-15 DIAGNOSIS — C3411 Malignant neoplasm of upper lobe, right bronchus or lung: Secondary | ICD-10-CM | POA: Diagnosis not present

## 2019-06-16 ENCOUNTER — Other Ambulatory Visit: Payer: Self-pay

## 2019-06-16 ENCOUNTER — Encounter: Payer: Self-pay | Admitting: Radiation Oncology

## 2019-06-16 ENCOUNTER — Ambulatory Visit
Admission: RE | Admit: 2019-06-16 | Discharge: 2019-06-16 | Disposition: A | Discharge status: Home / Self Care | Payer: Medicare Other | Source: Ambulatory Visit | Attending: Radiation Oncology | Admitting: Radiation Oncology

## 2019-06-16 DIAGNOSIS — C3411 Malignant neoplasm of upper lobe, right bronchus or lung: Secondary | ICD-10-CM | POA: Diagnosis not present

## 2019-06-22 ENCOUNTER — Encounter: Payer: Self-pay | Admitting: Pulmonary Disease

## 2019-06-22 ENCOUNTER — Other Ambulatory Visit: Payer: Self-pay

## 2019-06-22 ENCOUNTER — Ambulatory Visit: Payer: Medicare Other | Admitting: Pulmonary Disease

## 2019-06-22 VITALS — BP 94/60 | HR 103 | Ht 62.0 in | Wt 158.2 lb

## 2019-06-22 DIAGNOSIS — J410 Simple chronic bronchitis: Secondary | ICD-10-CM | POA: Diagnosis not present

## 2019-06-22 DIAGNOSIS — C3411 Malignant neoplasm of upper lobe, right bronchus or lung: Secondary | ICD-10-CM | POA: Diagnosis not present

## 2019-06-22 DIAGNOSIS — F17210 Nicotine dependence, cigarettes, uncomplicated: Secondary | ICD-10-CM

## 2019-06-22 DIAGNOSIS — F17200 Nicotine dependence, unspecified, uncomplicated: Secondary | ICD-10-CM | POA: Diagnosis not present

## 2019-06-22 DIAGNOSIS — R590 Localized enlarged lymph nodes: Secondary | ICD-10-CM

## 2019-06-22 MED ORDER — ALBUTEROL SULFATE (2.5 MG/3ML) 0.083% IN NEBU: 2.5000 mg | INHALATION_SOLUTION | Freq: Four times a day (QID) | RESPIRATORY_TRACT | 12 refills | Status: DC | PRN

## 2019-06-22 MED ORDER — STIOLTO RESPIMAT 2.5-2.5 MCG/ACT IN AERS: 2.0000 | INHALATION_SPRAY | Freq: Every day | RESPIRATORY_TRACT | 0 refills | Status: AC

## 2019-06-22 MED ORDER — STIOLTO RESPIMAT 2.5-2.5 MCG/ACT IN AERS: 2.0000 | INHALATION_SPRAY | Freq: Every day | RESPIRATORY_TRACT | 11 refills | Status: DC

## 2019-06-22 NOTE — Addendum Note (Signed)
Addended by: Lorretta Harp on: 06/22/2019 02:44 PM   Modules accepted: Orders

## 2019-06-22 NOTE — Progress Notes (Signed)
Synopsis: Referred in February 2021 for abnormal CT imaging, lung cancer by [name], MD  Subjective:   [name] GENDER: female DOB: 1956/02/01, MRN: [id number]  Chief Complaint  Patient presents with  . Follow-up    Pt states she just finished chemo and radiation treatments and states she is worn out from that. Pt states her BP has also been running low. Pt does become SOB mainly all the time now and also will cough up phlegm with occ blood in it.    64 year old female with recent diagnosis of lung cancer, COPD, hypertension, hyperlipidemia, longstanding history of tobacco abuse.  Patient was initially seen by myself via telephone visit April 06, 2019.  After being referred for abnormal CT imaging.  Patient had longstanding smoking history 50 pack years, 3 packs a day at her max.  CT imaging revealed a 13.8 mm spiculated nodule and a 1.9 cm right paratracheal lymph node.  Patient imaging concerning for primary bronchogenic carcinoma and decision was made for bronchoscopy with tissue diagnosis.  Patient was taken for bronchoscopy on 04/12/2019 to include endobronchial ultrasound with transbronchial needle aspiration biopsies of station 4R and station 7.  Patient was referred to Dr. Earlie Server for further evaluation and management of lung cancer.  She was seen in the multidisciplinary thoracic oncology conference on 04/21/2019.  Patient was diagnosed with a stage III (T1b, N2, M0, non-small cell lung cancer with mediastinal adenopathy.  Patient's pathology was consistent with non-small cell carcinoma.  OV 06/22/2019: Here today for follow-up after recent diagnosis of lung cancer and initiation of treatment to include chemo and radiation.  Patient has been seen by Dr. Lisbeth Renshaw and started on radiation with a goal to receive 60 Gy in 30 fractions.  Patient received final treatment on 06/16/2019.  Last oncology office visit 06/06/2019 for stage IIIa non-small cell lung cancer receiving  concurrent carboplatinum plus Taxol.. Patient is still smoking.  She has cut down from 3 packs a day to 2 packs/day.  She has completed her chemo and radiation at this time.  She still has significant cough and sputum production.  Occasionally she has had some blood-tinged uptake which she believes is following her radiation treatments.  This has since subsided that happened a few weeks ago.  Her respiratory symptoms predominantly bother her at nighttime with ongoing cough as well as significant dyspnea on exertion.   Past Medical History:  Diagnosis Date  . COPD (chronic obstructive pulmonary disease) (HCC)    emphysema  . Hyperlipidemia   . Hypertension   . Lung nodule   . Paroxysmal atrial flutter ([place])    per cardiology note  . Peripheral artery disease ([place])   . Tobacco abuse   . Vitamin D deficiency   . Wears glasses      Family History  Problem Relation Age of Onset  . Stroke Mother   . Stroke Father   . Lung cancer Father   . Mesothelioma Brother   . Lung cancer Sister   . Colon cancer Sister   . Anuerysm Brother      Past Surgical History:  Procedure Laterality Date  . BRONCHIAL NEEDLE ASPIRATION BIOPSY  04/12/2019   Procedure: BRONCHIAL NEEDLE ASPIRATION BIOPSIES;  Surgeon: Garner Nash, DO;  Location: [facility];  Service: Cardiopulmonary;;  . ENDOBRONCHIAL ULTRASOUND N/A 04/12/2019   Procedure: ENDOBRONCHIAL ULTRASOUND;  Surgeon: Garner Nash, DO;  Location: [facility];  Service: Cardiopulmonary;  Laterality: N/A;  . OVARIAN CYST REMOVAL    .  TONSILLECTOMY    . TUBAL LIGATION    . VIDEO BRONCHOSCOPY N/A 04/12/2019   Procedure: VIDEO BRONCHOSCOPY WITHOUT FLUORO;  Surgeon: Garner Nash, DO;  Location: [facility];  Service: Cardiopulmonary;  Laterality: N/A;    Social History   Socioeconomic History  . Marital status: Married    Spouse name: Not on file  . Number of children: 3  . Years of education: Not on file  . Highest education level: Not  on file  Occupational History  . Not on file  Tobacco Use  . Smoking status: Current Every Day Smoker    Packs/day: 3.50    Years: 50.00    Pack years: 175.00    Types: Cigarettes    Start date: 03/31/1962  . Smokeless tobacco: Never Used  . Tobacco comment: Pt currently smoking 2ppd  Substance and Sexual Activity  . Alcohol use: Yes    Alcohol/week: 8.0 standard drinks    Types: 8 Shots of liquor per week    Comment: " Mixed drinks daily"  . Drug use: Never  . Sexual activity: Not on file  Other Topics Concern  . Not on file  Social History Narrative  . Not on file   Social Determinants of Health   Financial Resource Strain:   . Difficulty of Paying Living Expenses:   Food Insecurity:   . Worried About Charity fundraiser in the Last Year:   . Arboriculturist in the Last Year:   Transportation Needs:   . Film/video editor (Medical):   Marland Kitchen Lack of Transportation (Non-Medical):   Physical Activity:   . Days of Exercise per Week:   . Minutes of Exercise per Session:   Stress:   . Feeling of Stress :   Social Connections:   . Frequency of Communication with Friends and Family:   . Frequency of Social Gatherings with Friends and Family:   . Attends Religious Services:   . Active Member of Clubs or Organizations:   . Attends Archivist Meetings:   Marland Kitchen Marital Status:   Intimate Partner Violence:   . Fear of Current or Ex-Partner:   . Emotionally Abused:   Marland Kitchen Physically Abused:   . Sexually Abused:      No Known Allergies   Outpatient Medications Prior to Visit  Medication Sig Dispense Refill  . aspirin EC 81 MG tablet Take 1 tablet (81 mg total) by mouth daily. 90 tablet 3  . Cholecalciferol (DIALYVITE VITAMIN D 5000) 125 MCG (5000 UT) capsule Take 5,000 Units by mouth daily.    Marland Kitchen escitalopram (LEXAPRO) 10 MG tablet Take 10 mg by mouth daily.    Marland Kitchen HYDROcodone-acetaminophen (HYCET) 7.5-325 mg/15 ml solution Take 10 mLs by mouth 4 (four) times daily as  needed for moderate pain. 120 mL 0  . rosuvastatin (CRESTOR) 20 MG tablet Take 20 mg by mouth every evening.     . sucralfate (CARAFATE) 1 g tablet Take 1 tablet (1 g total) by mouth 4 (four) times daily. Dissolve each tablet in 15 cc water before use. 120 tablet 2  . vitamin B-12 (CYANOCOBALAMIN) 1000 MCG tablet Take 1,000 mcg by mouth daily.    . prochlorperazine (COMPAZINE) 10 MG tablet Take 1 tablet (10 mg total) by mouth every 6 (six) hours as needed for nausea or vomiting. (Patient not taking: Reported on 05/23/2019) 30 tablet 0   No facility-administered medications prior to visit.    Review of Systems  Constitutional: Positive for  malaise/fatigue and weight loss. Negative for chills and fever.  HENT: Negative for hearing loss, sore throat and tinnitus.   Eyes: Negative for blurred vision and double vision.  Respiratory: Positive for cough, hemoptysis, sputum production, shortness of breath and wheezing. Negative for stridor.   Cardiovascular: Negative for chest pain, palpitations, orthopnea, leg swelling and PND.  Gastrointestinal: Negative for abdominal pain, constipation, diarrhea, heartburn, nausea and vomiting.  Genitourinary: Negative for dysuria, hematuria and urgency.  Musculoskeletal: Positive for myalgias. Negative for joint pain.  Skin: Negative for itching and rash.  Neurological: Negative for dizziness, tingling, weakness and headaches.  Endo/Heme/Allergies: Negative for environmental allergies. Does not bruise/bleed easily.  Psychiatric/Behavioral: Positive for depression. The patient is nervous/anxious. The patient does not have insomnia.   All other systems reviewed and are negative.    Objective:  Physical Exam Vitals reviewed.  Constitutional:      General: She is not in acute distress.    Appearance: She is well-developed.  HENT:     Head: Normocephalic and atraumatic.     Mouth/Throat:     Pharynx: No oropharyngeal exudate.  Eyes:     Conjunctiva/sclera:  Conjunctivae normal.     Pupils: Pupils are equal, round, and reactive to light.  Neck:     Vascular: No JVD.     Trachea: No tracheal deviation.     Comments: Loss of supraclavicular fat Cardiovascular:     Rate and Rhythm: Normal rate and regular rhythm.     Heart sounds: S1 normal and S2 normal.     Comments: Distant heart tones Pulmonary:     Effort: No tachypnea or accessory muscle usage.     Breath sounds: No stridor. Decreased breath sounds (throughout all lung fields) present. No wheezing, rhonchi or rales.  Abdominal:     General: Bowel sounds are normal. There is no distension.     Palpations: Abdomen is soft.     Tenderness: There is no abdominal tenderness.  Musculoskeletal:        General: No deformity (muscle wasting ).     Comments: Severe yellowing of the nails on the left side.  Skin:    General: Skin is warm and dry.     Capillary Refill: Capillary refill takes less than 2 seconds.     Findings: No rash.  Neurological:     Mental Status: She is alert and oriented to person, place, and time.  Psychiatric:        Behavior: Behavior normal.      Vitals:   06/22/19 1401  BP: 94/60  Pulse: (!) 103  SpO2: 98%  Weight: 158 lb 3.2 oz (71.8 kg)  Height: 5\' 2"  (1.575 m)   98% on RA BMI Readings from Last 3 Encounters:  06/22/19 28.94 kg/m  06/06/19 30.12 kg/m  05/30/19 30.27 kg/m   Wt Readings from Last 3 Encounters:  06/22/19 158 lb 3.2 oz (71.8 kg)  06/06/19 164 lb 11.2 oz (74.7 kg)  05/30/19 165 lb 8 oz (75.1 kg)     CBC    Component Value Date/Time   WBC 1.4 (L) 06/13/2019 0948   WBC 8.1 04/12/2019 0615   RBC 2.89 (L) 06/13/2019 0948   HGB 9.4 (L) 06/13/2019 0948   HCT 27.4 (L) 06/13/2019 0948   PLT 183 06/13/2019 0948   MCV 94.8 06/13/2019 0948   MCH 32.5 06/13/2019 0948   MCHC 34.3 06/13/2019 0948   RDW 13.6 06/13/2019 0948   LYMPHSABS 0.2 (L) 06/13/2019 6812  MONOABS 0.2 06/13/2019 0948   EOSABS 0.1 06/13/2019 0948   BASOSABS 0.0  06/13/2019 0948     Chest Imaging: Lung cancer screening CT 03/30/2019: Spiculated lung mass with enlarged right paratracheal adenopathy. The patient's images have been independently reviewed by me.    04/14/2019: Nuclear medicine pet imaging PET avid right upper lobe mass with subcarinal and paratracheal adenopathy. The patient's images have been independently reviewed by me.    05/03/2019: MRI of the brain with no evidence of metastatic disease.  Pulmonary Functions Testing Results: No flowsheet data found.  FeNO: None   Pathology:   January 2021: EBUS pathology non-small cell lung cancer  Echocardiogram: None   Heart Catheterization: None     Assessment & Plan:     ICD-10-CM   1. Malignant neoplasm of upper lobe of right lung (HCC)  C34.11 Pulmonary Function Test  2. Simple chronic bronchitis ([place])  J41.0 Pulmonary Function Test  3. Mediastinal adenopathy  R59.0   4. Tobacco dependence  F17.200   5. Cigarette smoker  F17.210     Discussion: This is a 64 year old female with stage III small cell lung cancer status post concurrent chemo plus radiation.  She likely has COPD, with chronic bronchitis daily sputum production.  She has a longstanding smoking history.  And unfortunately still an active smoker.  Today we discussed the risks of this continued and the likelihood if she continues her current behavior that this could lead to ongoing respiratory decline potentially hospitalization or even death.  Plan Following Extensive Data Review & Interpretation:  . I reviewed prior external note(s) from office visit 05/23/2019 Dr. Earlie Server, medical oncology, 06/16/2018 Final treatment per radiation oncology. . I reviewed the result(s) of 06/13/2019, neutropenia, white blood cell count 1.4 . I have ordered full pulmonary function test, referral to pharmacy for her smoking cessation counseling New prescription for Stiolto, Stiolto samples Albuterol nebulizer, albuterol  inhaler,  Independent interpretation of tests . Review of patient's 04/14/2019 PET scan images revealed a metabolic right upper lobe nodule, paratracheal and right hilar subcarinal adenopathy consistent with T1b, N2, M0 disease.. The patient's images have been independently reviewed by me.    Patient return to clinic in 2 months.     Current Outpatient Medications:  .  aspirin EC 81 MG tablet, Take 1 tablet (81 mg total) by mouth daily., Disp: 90 tablet, Rfl: 3 .  Cholecalciferol (DIALYVITE VITAMIN D 5000) 125 MCG (5000 UT) capsule, Take 5,000 Units by mouth daily., Disp: , Rfl:  .  escitalopram (LEXAPRO) 10 MG tablet, Take 10 mg by mouth daily., Disp: , Rfl:  .  HYDROcodone-acetaminophen (HYCET) 7.5-325 mg/15 ml solution, Take 10 mLs by mouth 4 (four) times daily as needed for moderate pain., Disp: 120 mL, Rfl: 0 .  rosuvastatin (CRESTOR) 20 MG tablet, Take 20 mg by mouth every evening. , Disp: , Rfl:  .  sucralfate (CARAFATE) 1 g tablet, Take 1 tablet (1 g total) by mouth 4 (four) times daily. Dissolve each tablet in 15 cc water before use., Disp: 120 tablet, Rfl: 2 .  vitamin B-12 (CYANOCOBALAMIN) 1000 MCG tablet, Take 1,000 mcg by mouth daily., Disp: , Rfl:    Garner Nash, DO [facility] 06/22/2019 2:11 PM

## 2019-06-22 NOTE — Patient Instructions (Addendum)
Thank you for visiting Dr. Valeta Harms at [facility]. Today we recommend the following:  Albuterol nebulizer solution Albuterol inhaler  Start stiolto samples Continue efforts to stop smoking.   Referral to pharmacy for   Return in about 2 months (around 08/22/2019). w/ Keland Peyton     Please do your part to reduce the spread of COVID-19.

## 2019-06-22 NOTE — Progress Notes (Signed)
Patient seen in the office today and instructed on use of stiolto inhaler.  Patient expressed understanding and demonstrated technique.

## 2019-06-23 ENCOUNTER — Ambulatory Visit: Payer: Medicare Other | Admitting: Cardiology

## 2019-06-23 NOTE — Progress Notes (Signed)
   Subjective Patient was last seen and referred by Dr. Valeta Harms on 06/22/2019. PMH is significant for lung cancer, COPD, HTN, HLD, paroxysmal Afib, and aortic atherosclerosis. Patient has [facility] Medicare, therefore, NRT will not be covered by insurance. Chantix will cost $47 and bupropion XL $8 via her insurance.  Patient presents for initial appt with pharmacy team with her husband, Betty Anderson (permission given). Patient smokes in her house and in the car. Patient's husband smokes as well. Patient and husband are willing to quit if each other quit. They are both unsure if they are ready to quit right now, but are willing to think about it.  Social History   Tobacco Use  Smoking Status Current Every Day Smoker  . Packs/day: 3.50  . Years: 50.00  . Pack years: 175.00  . Types: Cigarettes  . Start date: 03/31/1962  Smokeless Tobacco Never Used  Tobacco Comment   Pt currently smoking 2ppd     Tobacco Use History  Age when started using tobacco on a daily basis 64 years old   Type: cigarettes.  Number of cigarettes per day 2 packs per day (40-45 cigs/day), brand Marlboro Reds.  Smokes first cigarette within 30 minutes after waking.  Does not wake at night to smoke  Triggers include cravings, stress/anger, after meals/sex  Quit Attempt History   Most recent quit attempt "almost 30 years ago".  Longest time ever been tobacco free 2 weeks.  Methods tried in the past include  "cold [place]"  Motivators to quitting include cancer diagnosis; barriers include fear of weight gain / fear of getting upset/nervous without having cigarette to calm her      There is no immunization history on file for this patient.   Assessment and Plan  1. Smoking Cessation  a. Provided information on 1 800-QUIT NOW support program.  b. Non-Pharmacologic i. Provided patient with "Things to do beside Smoking" handout. c. Pharmacologic i. Thoroughly discussed recommendations for smoking cessation based on 2020  ATS guidelines - patient and husband would likely benefit from Chantix + NRT. Patient and husband is not interested in using Chantix, nicotine gum, or nicotine lozenge. Patient and husband will consider contacting [place] for nicotine patch. Patient and husband may consider bupropion. They are not ready to quit at the moment, but are willing to consider quitting more. Patient counseled on purpose, proper use, potential adverse effects, and costs of all smoking cessation medications. Advised patient to contact office for follow up if she and her husband decide to pursue smoking cessation. Provided patient smoking cessation handouts. Patient verbalized understanding and expressed appreciation for the discussion.   2. Medication Reconciliation a. A drug regimen assessment was performed, including review of allergies, interactions, disease-state management, dosing and immunization history. Medications were reviewed with the patient, including name, instructions, indication, goals of therapy, potential side effects, importance of adherence, and safe use. No concerning drug interactions identified. Discontinued  1. Patient self-D/C sucralfate because she did not like the taste and stated she did not need it anymore.  This appointment required 40 minutes of patient care (this includes precharting, chart review, review of results, face-to-face care, etc.).  Thank you for involving pharmacy to assist in providing this patient's care.   Drexel Iha, PharmD PGY2 Ambulatory Care Pharmacy Resident

## 2019-06-27 ENCOUNTER — Ambulatory Visit (INDEPENDENT_AMBULATORY_CARE_PROVIDER_SITE_OTHER): Payer: Medicare Other | Admitting: Pharmacist

## 2019-06-27 ENCOUNTER — Other Ambulatory Visit: Payer: Self-pay

## 2019-06-27 DIAGNOSIS — F17210 Nicotine dependence, cigarettes, uncomplicated: Secondary | ICD-10-CM | POA: Diagnosis not present

## 2019-07-06 ENCOUNTER — Telehealth: Payer: Self-pay | Admitting: Physician Assistant

## 2019-07-06 NOTE — Telephone Encounter (Signed)
Called the patient because I noticed her restaging CT scan has not been scheduled. I called and spoke to the patient. I gave her the number to radiology scheduling and asked that she schedule her scan for the first available. Her follow up appointment to review her scan is scheduled for 4/12. I asked her to call us back to push her follow up appointment back with [place] if they could not schedule her scan before her office visit. She was given our number.

## 2019-07-07 ENCOUNTER — Inpatient Hospital Stay: Payer: Medicare Other

## 2019-07-11 ENCOUNTER — Ambulatory Visit: Payer: Medicare Other | Admitting: Physician Assistant

## 2019-07-12 NOTE — Progress Notes (Signed)
[facility] OFFICE PROGRESS NOTE  [name], MD [address] [place] [address]  DIAGNOSIS: Stage IIIA (T1b, N2, M0) non-small cell lung cancer presented with right upper lobe lung nodule in addition to mediastinal lymphadenopathy diagnosed in January 2021.  PRIOR THERAPY:  1) Concurrent chemoradiation with weekly carboplatin for AUC of 2 and paclitaxel 45 NG/M2. Status post 5cycles. Last dose received on 05/30/19.  CURRENT THERAPY: Consolidation immunotherapy with Imfinzi 1500 mg/kg IV every 4 weeks.  First dose expected on 07/21/2019  INTERVAL HISTORY: Betty Anderson 64 y.o. female returns to clinic today for follow-up visit accompanied by her husband.  The patient is feeling fairly well today without any concerning complaints except for fatigue.  She completed her last cycle of weekly carboplatin and paclitaxel on 05/30/2019.  She was unable to receive cycle #6 and 7 due to thrombocytopenia and neutropenia.  Since completing her concurrent chemoradiation, her odynophagia/dysphagia has improved.  She lost approximately 8 pounds since her last appointment on 06/06/2019  The patient recently had a visit with her pulmonologist who adjusted her inhalers as well as counsel her on smoking cessation, as well as referred her to pharmacy for formal smoking cessation counseling.  She continues to smoke approximately 1.5 packs per day.  She continues to experience her baseline dyspnea on exertion and productive smoker's cough.  Otherwise she is feeling fairly well.  She denies any recent fever, chills, night sweats.  She denies any chest pain or hemoptysis.  She denies any nausea, vomiting, diarrhea, or constipation.  She denies any headache or visual changes.  The patient recently had a restaging CT scan performed.  She is here today for evaluation and to review her scan results and to have a more detailed discussion about her treatment options.   MEDICAL HISTORY: Past Medical  History:  Diagnosis Date  . COPD (chronic obstructive pulmonary disease) (HCC)    emphysema  . Hyperlipidemia   . Hypertension   . Lung nodule   . Paroxysmal atrial flutter ([facility])    per cardiology note  . Peripheral artery disease ([facility])   . Tobacco abuse   . Vitamin D deficiency   . Wears glasses     ALLERGIES:  has No Known Allergies.  MEDICATIONS:  Current Outpatient Medications  Medication Sig Dispense Refill  . albuterol (PROVENTIL) (2.5 MG/3ML) 0.083% nebulizer solution Take 3 mLs (2.5 mg total) by nebulization every 6 (six) hours as needed for wheezing or shortness of breath. (Patient not taking: Reported on 06/27/2019) 75 mL 12  . aspirin EC 81 MG tablet Take 1 tablet (81 mg total) by mouth daily. 90 tablet 3  . Cholecalciferol (DIALYVITE VITAMIN D 5000) 125 MCG (5000 UT) capsule Take 5,000 Units by mouth daily.    Marland Kitchen escitalopram (LEXAPRO) 10 MG tablet Take 10 mg by mouth daily.    Marland Kitchen HYDROcodone-acetaminophen (HYCET) 7.5-325 mg/15 ml solution Take 10 mLs by mouth 4 (four) times daily as needed for moderate pain. 120 mL 0  . rosuvastatin (CRESTOR) 20 MG tablet Take 20 mg by mouth every evening.     . Tiotropium Bromide-Olodaterol (STIOLTO RESPIMAT) 2.5-2.5 MCG/ACT AERS Inhale 2 puffs into the lungs daily. 4 g 11  . Tiotropium Bromide-Olodaterol (STIOLTO RESPIMAT) 2.5-2.5 MCG/ACT AERS Inhale 2 puffs into the lungs daily. 4 g 0  . vitamin B-12 (CYANOCOBALAMIN) 1000 MCG tablet Take 1,000 mcg by mouth daily.     No current facility-administered medications for this visit.    SURGICAL HISTORY:  Past Surgical History:  Procedure Laterality Date  . BRONCHIAL NEEDLE ASPIRATION BIOPSY  04/12/2019   Procedure: BRONCHIAL NEEDLE ASPIRATION BIOPSIES;  Surgeon: Garner Nash, DO;  Location: [facility];  Service: Cardiopulmonary;;  . ENDOBRONCHIAL ULTRASOUND N/A 04/12/2019   Procedure: ENDOBRONCHIAL ULTRASOUND;  Surgeon: Garner Nash, DO;  Location: [facility];  Service:  Cardiopulmonary;  Laterality: N/A;  . OVARIAN CYST REMOVAL    . TONSILLECTOMY    . TUBAL LIGATION    . VIDEO BRONCHOSCOPY N/A 04/12/2019   Procedure: VIDEO BRONCHOSCOPY WITHOUT FLUORO;  Surgeon: Garner Nash, DO;  Location: [facility];  Service: Cardiopulmonary;  Laterality: N/A;    REVIEW OF SYSTEMS:   Review of Systems  Constitutional: Positive for fatigue and weight loss.  Negative for appetite change, chills, and fever. HENT: Negative for mouth sores, nosebleeds, sore throat and trouble swallowing.   Eyes: Negative for eye problems and icterus.  Respiratory: Positive for shortness of breath and cough. negative for  hemoptysis, and wheezing.   Cardiovascular: Negative for chest pain and leg swelling.  Gastrointestinal: Negative for abdominal pain, constipation, diarrhea, nausea and vomiting.  Genitourinary: Negative for bladder incontinence, difficulty urinating, dysuria, frequency and hematuria.   Musculoskeletal: Negative for back pain, gait problem, neck pain and neck stiffness.  Skin: Negative for itching and rash.  Neurological: Negative for dizziness, extremity weakness, gait problem, headaches, light-headedness and seizures.  Hematological: Negative for adenopathy. Does not bruise/bleed easily.  Psychiatric/Behavioral: Negative for confusion, depression and sleep disturbance. The patient is not nervous/anxious.     PHYSICAL EXAMINATION:  There were no vitals taken for this visit.  ECOG PERFORMANCE STATUS: 1 - Symptomatic but completely ambulatory  Physical Exam  Constitutional: Oriented to person, place, and time and well-developed, well-nourished, and in no distress.   HENT:  Head: Normocephalic and atraumatic.  Mouth/Throat: Oropharynx is clear and moist. No oropharyngeal exudate.  Eyes: Conjunctivae are normal. Right eye exhibits no discharge. Left eye exhibits no discharge. No scleral icterus.  Neck: Normal range of motion. Neck supple.  Cardiovascular: Normal  rate, regular rhythm, normal heart sounds and intact distal pulses.   Pulmonary/Chest: Effort normal and breath sounds normal. No respiratory distress. No wheezes. No rales.  Abdominal: Soft. Bowel sounds are normal. Exhibits no distension and no mass. There is no tenderness.  Musculoskeletal: Normal range of motion. Exhibits no edema.  Lymphadenopathy:    No cervical adenopathy.  Neurological: Alert and oriented to person, place, and time. Exhibits normal muscle tone. Gait normal. Coordination normal.  Skin: Skin is warm and dry. No rash noted. Not diaphoretic. No erythema. No pallor.  Psychiatric: Mood, memory and judgment normal.  Vitals reviewed.  LABORATORY DATA: Lab Results  Component Value Date   WBC 1.4 (L) 06/13/2019   HGB 9.4 (L) 06/13/2019   HCT 27.4 (L) 06/13/2019   MCV 94.8 06/13/2019   PLT 183 06/13/2019      Chemistry      Component Value Date/Time   NA 140 06/13/2019 0948   K 3.6 06/13/2019 0948   CL 106 06/13/2019 0948   CO2 22 06/13/2019 0948   BUN 14 06/13/2019 0948   CREATININE 0.93 06/13/2019 0948      Component Value Date/Time   CALCIUM 9.2 06/13/2019 0948   ALKPHOS 88 06/13/2019 0948   AST 13 (L) 06/13/2019 0948   ALT 8 06/13/2019 0948   BILITOT 0.5 06/13/2019 0948       RADIOGRAPHIC STUDIES:  No results found.   ASSESSMENT/PLAN:  This  is a very pleasant 64 year old Caucasian female diagnosed with stage IIIa non-small cell lung cancer.  She presented with a right upper lobe lung mass and mediastinal lymphadenopathy.  She was diagnosed in January 2021.  She recently completed 5 cycles of weekly concurrent chemoradiation with carboplatin for an AUC of 2 and paclitaxel 45 mg/m.   The patient recently had a restaging CT scan performed.  Dr. Julien Nordmann personally and independently reviewed the scan and discussed the results with the patient today.  The scan showed a positive response to therapy with a partial regression of the right upper lobe  pulmonary nodule and no evidence of disease progression anywhere else.  Dr. Julien Nordmann recommends that the patient continue on consolidation immunotherapy with Imfinzi 1500 mg IV every 4 weeks.  The patient is interested in this option and she is expected to receive her first dose on 07/21/2019.   I discussed with her the adverse effect of the immunotherapy including but not limited to immunotherapy mediated skin rash, diarrhea, inflammation of the lung, kidney, liver, thyroid or other endocrine dysfunction  We will see her for follow-up visit in approximately 3 weeks for evaluation and to manage any adverse side effects of treatment  The patient was strongly encouraged to quit smoking. She recently had formal smoking cessation counseling.   The patient was advised to call immediately if she has any concerning symptoms in the interval. The patient voices understanding of current disease status and treatment options and is in agreement with the current care plan. All questions were answered. The patient knows to call the clinic with any problems, questions or concerns. We can certainly see the patient much sooner if necessary    No orders of the defined types were placed in this encounter.    Mccall Will L Christal Lagerstrom, PA-C 07/12/19  ADDENDUM: Hematology/Oncology Attending: I had a face-to-face encounter with the patient today.  I recommended her care plan.  This is a very pleasant 64 years old white female with history of stage IIIa non-small cell lung cancer. The patient is status post a course of concurrent chemoradiation with weekly carboplatin and paclitaxel. She tolerated her treatment well with no concerning adverse effect except for fatigue. She had repeat CT scan of the chest performed recently.  I personally and independently reviewed the scan images and discussed the results with the patient and her husband today. Her scan showed partial response of her disease. I discussed with  the patient the option of continuous observation and close monitoring versus treatment with consolidation immunotherapy with Imfinzi 1500 mg IV every 4 weeks.  The patient is interested in the treatment. I discussed with her the adverse effect of this treatment including but not limited to immunotherapy mediated skin rash, diarrhea, inflammation of the lung, kidney, liver, thyroid or other endocrine dysfunction. She is expected to start the first cycle of this treatment next week. The patient will come back for follow-up visit in 3 weeks for evaluation and management of any adverse effect of her treatment. The patient was advised to call immediately if she has any concerning symptoms in the interval.  Disclaimer: This note was dictated with voice recognition software. Similar sounding words can inadvertently be transcribed and may be missed upon review. Eilleen Kempf, MD 07/14/19

## 2019-07-13 ENCOUNTER — Other Ambulatory Visit: Payer: Self-pay

## 2019-07-13 ENCOUNTER — Ambulatory Visit (HOSPITAL_COMMUNITY)
Admission: RE | Admit: 2019-07-13 | Discharge: 2019-07-13 | Disposition: A | Discharge status: Home / Self Care | Payer: Medicare Other | Source: Ambulatory Visit | Attending: Physician Assistant | Admitting: Physician Assistant

## 2019-07-13 DIAGNOSIS — C3411 Malignant neoplasm of upper lobe, right bronchus or lung: Secondary | ICD-10-CM | POA: Diagnosis not present

## 2019-07-13 MED ORDER — IOHEXOL 300 MG/ML  SOLN
75.0000 mL | Freq: Once | INTRAMUSCULAR | Status: AC | PRN
  Administered 2019-07-13: 75 mL via INTRAVENOUS

## 2019-07-13 MED ORDER — SODIUM CHLORIDE (PF) 0.9 % IJ SOLN
INTRAMUSCULAR | Status: AC
  Filled 2019-07-13: qty 50

## 2019-07-14 ENCOUNTER — Encounter: Payer: Self-pay | Admitting: Physician Assistant

## 2019-07-14 ENCOUNTER — Telehealth: Payer: Self-pay | Admitting: Physician Assistant

## 2019-07-14 ENCOUNTER — Other Ambulatory Visit: Payer: Self-pay | Admitting: Internal Medicine

## 2019-07-14 ENCOUNTER — Inpatient Hospital Stay: Payer: Medicare Other | Attending: Internal Medicine | Admitting: Physician Assistant

## 2019-07-14 ENCOUNTER — Other Ambulatory Visit: Payer: Self-pay

## 2019-07-14 VITALS — BP 101/59 | HR 92 | Temp 98.3°F | Resp 18 | Ht 62.0 in | Wt 156.8 lb

## 2019-07-14 DIAGNOSIS — R0609 Other forms of dyspnea: Secondary | ICD-10-CM | POA: Diagnosis not present

## 2019-07-14 DIAGNOSIS — I4892 Unspecified atrial flutter: Secondary | ICD-10-CM | POA: Insufficient documentation

## 2019-07-14 DIAGNOSIS — R5383 Other fatigue: Secondary | ICD-10-CM | POA: Diagnosis not present

## 2019-07-14 DIAGNOSIS — C3411 Malignant neoplasm of upper lobe, right bronchus or lung: Secondary | ICD-10-CM | POA: Insufficient documentation

## 2019-07-14 DIAGNOSIS — Z79899 Other long term (current) drug therapy: Secondary | ICD-10-CM | POA: Diagnosis not present

## 2019-07-14 DIAGNOSIS — R1310 Dysphagia, unspecified: Secondary | ICD-10-CM | POA: Diagnosis not present

## 2019-07-14 DIAGNOSIS — F17200 Nicotine dependence, unspecified, uncomplicated: Secondary | ICD-10-CM | POA: Diagnosis not present

## 2019-07-14 DIAGNOSIS — Z5112 Encounter for antineoplastic immunotherapy: Secondary | ICD-10-CM | POA: Insufficient documentation

## 2019-07-14 DIAGNOSIS — R590 Localized enlarged lymph nodes: Secondary | ICD-10-CM | POA: Diagnosis not present

## 2019-07-14 DIAGNOSIS — F17210 Nicotine dependence, cigarettes, uncomplicated: Secondary | ICD-10-CM | POA: Insufficient documentation

## 2019-07-14 DIAGNOSIS — R911 Solitary pulmonary nodule: Secondary | ICD-10-CM | POA: Diagnosis not present

## 2019-07-14 DIAGNOSIS — Z7189 Other specified counseling: Secondary | ICD-10-CM

## 2019-07-14 NOTE — Patient Instructions (Signed)
-We covered a lot of important information at your appointment today regarding what the treatment plan is moving forward. Here are the the main points that were discussed at your office visit with [place] today:  -The treatment will consist of a new medication. This is not chemotherapy. This new drug is a type of Immunotherapy called Imfinzi (Durvalumab). Treatment is 30 mins every 4 weeks.  -We are planning on starting your treatment next week on 07/21/19  -Your treatment will be given once every 4 weeks. You will receive this treatment every four weeks for a total of 1 year (12 total treatments) unless you experience unacceptable toxicity or if there is evidence on your routine CT scans that the cancer is growing  -We will get a CT scan after every 3 treatments to check on the progress of treatment  Side Effects:  -The adverse effect of the immunotherapy including but not limited to immunotherapy mediated skin rash, diarrhea, inflammation of the lung, kidney, liver, thyroid or other endocrine dysfunction  Follow up:  -We will see you back for a follow up visit in 3 weeks for a follow up to see how the first treatment went.  Durvalumab injection What is this medicine? DURVALUMAB (dur VAL ue mab) is a monoclonal antibody. It is used to treat urothelial cancer and lung cancer. This medicine may be used for other purposes; ask your health care provider or pharmacist if you have questions. COMMON BRAND NAME(S): IMFINZI What should I tell my health care provider before I take this medicine? They need to know if you have any of these conditions:  diabetes  immune system problems  infection  inflammatory bowel disease  kidney disease  liver disease  lung or breathing disease  lupus  organ transplant  stomach or intestine problems  thyroid disease  an unusual or allergic reaction to durvalumab, other medicines, foods, dyes, or preservatives  pregnant or trying to get  pregnant  breast-feeding How should I use this medicine? This medicine is for infusion into a vein. It is given by a health care professional in a hospital or clinic setting. A special MedGuide will be given to you before each treatment. Be sure to read this information carefully each time. Talk to your pediatrician regarding the use of this medicine in children. Special care may be needed. Overdosage: If you think you have taken too much of this medicine contact a poison control center or emergency room at once. NOTE: This medicine is only for you. Do not share this medicine with others. What if I miss a dose? It is important not to miss your dose. Call your doctor or health care professional if you are unable to keep an appointment. What may interact with this medicine? Interactions have not been studied. This list may not describe all possible interactions. Give your health care provider a list of all the medicines, herbs, non-prescription drugs, or dietary supplements you use. Also tell them if you smoke, drink alcohol, or use illegal drugs. Some items may interact with your medicine. What should I watch for while using this medicine? This drug may make you feel generally unwell. Continue your course of treatment even though you feel ill unless your doctor tells you to stop. You may need blood work done while you are taking this medicine. Do not become pregnant while taking this medicine or for 3 months after stopping it. Women should inform their doctor if they wish to become pregnant or think they might be pregnant.  There is a potential for serious side effects to an unborn child. Talk to your health care professional or pharmacist for more information. Do not breast-feed an infant while taking this medicine or for 3 months after stopping it. What side effects may I notice from receiving this medicine? Side effects that you should report to your doctor or health care professional as soon as  possible:  allergic reactions like skin rash, itching or hives, swelling of the face, lips, or tongue  black, tarry stools  bloody or watery diarrhea  breathing problems  change in emotions or moods  change in sex drive  changes in vision  chest pain or chest tightness  chills  confusion  cough  facial flushing  fever  headache  signs and symptoms of high blood sugar such as dizziness; dry mouth; dry skin; fruity breath; nausea; stomach pain; increased hunger or thirst; increased urination  signs and symptoms of liver injury like dark yellow or brown urine; general ill feeling or flu-like symptoms; light-colored stools; loss of appetite; nausea; right upper belly pain; unusually weak or tired; yellowing of the eyes or skin  stomach pain  trouble passing urine or change in the amount of urine  weight gain or weight loss Side effects that usually do not require medical attention (report these to your doctor or health care professional if they continue or are bothersome):  bone pain  constipation  loss of appetite  muscle pain  nausea  swelling of the ankles, feet, hands  tiredness This list may not describe all possible side effects. Call your doctor for medical advice about side effects. You may report side effects to FDA at 1-800-FDA-1088. Where should I keep my medicine? This drug is given in a hospital or clinic and will not be stored at home. NOTE: This sheet is a summary. It may not cover all possible information. If you have questions about this medicine, talk to your doctor, pharmacist, or health care provider.  2020 Elsevier/Gold Standard (2016-05-27 19:25:04)

## 2019-07-14 NOTE — Telephone Encounter (Signed)
Scheduled appt per 4/15 los.  Printed calendar and avs.

## 2019-07-19 NOTE — Progress Notes (Signed)
Pharmacist Chemotherapy Monitoring - Initial Assessment    Anticipated start date: 07/21/19  Regimen:  . Are orders appropriate based on the patient's diagnosis, regimen, and cycle? Yes . Does the plan date match the patient's scheduled date? Yes . Is the sequencing of drugs appropriate? Yes . Are the premedications appropriate for the patient's regimen? Yes . Prior Authorization for treatment is: Approved o If applicable, is the correct biosimilar selected based on the patient's insurance? not applicable  Organ Function and Labs: Marland Kitchen Are dose adjustments needed based on the patient's renal function, hepatic function, or hematologic function? No . Are appropriate labs ordered prior to the start of patient's treatment? Yes . Other organ system assessment, if indicated: N/A . The following baseline labs, if indicated, have been ordered: durvalumab: baseline TSH +/- T4  Dose Assessment: . Are the drug doses appropriate? Yes . Are the following correct: o Drug concentrations Yes o IV fluid compatible with drug Yes o Administration routes Yes o Timing of therapy Yes . If applicable, does the patient have documented access for treatment and/or plans for port-a-cath placement? no . If applicable, have lifetime cumulative doses been properly documented and assessed? not applicable Lifetime Dose Tracking  . Carboplatin: 880 mg = 0.01 % of the maximum lifetime dose of 999,999,999 mg  o   Toxicity Monitoring/Prevention: . The patient has the following take home antiemetics prescribed: N/A . The patient has the following take home medications prescribed: N/A . Medication allergies and previous infusion related reactions, if applicable, have been reviewed and addressed. Yes . The patient's current medication list has been assessed for drug-drug interactions with their chemotherapy regimen. no significant drug-drug interactions were identified on review.  Order Review: . Are the treatment plan  orders signed? Yes . Is the patient scheduled to see a provider prior to their treatment? No  I verify that I have reviewed each item in the above checklist and answered each question accordingly.   Kennith Center, Pharm.D., CPP 07/19/2019@5 :17 PM

## 2019-07-21 ENCOUNTER — Other Ambulatory Visit: Payer: Self-pay

## 2019-07-21 ENCOUNTER — Inpatient Hospital Stay: Payer: Medicare Other

## 2019-07-21 ENCOUNTER — Inpatient Hospital Stay: Payer: Medicare Other | Admitting: Nutrition

## 2019-07-21 ENCOUNTER — Telehealth: Payer: Self-pay | Admitting: Radiation Oncology

## 2019-07-21 VITALS — BP 102/61 | HR 80 | Temp 98.3°F | Resp 17

## 2019-07-21 DIAGNOSIS — Z5112 Encounter for antineoplastic immunotherapy: Secondary | ICD-10-CM | POA: Diagnosis not present

## 2019-07-21 DIAGNOSIS — C3411 Malignant neoplasm of upper lobe, right bronchus or lung: Secondary | ICD-10-CM

## 2019-07-21 LAB — CBC WITH DIFFERENTIAL (CANCER CENTER ONLY)
Abs Immature Granulocytes: 0.04 10*3/uL (ref 0.00–0.07)
Basophils Absolute: 0.1 10*3/uL (ref 0.0–0.1)
Basophils Relative: 1 %
Eosinophils Absolute: 0.2 10*3/uL (ref 0.0–0.5)
Eosinophils Relative: 3 %
HCT: 30.5 % — ABNORMAL LOW (ref 36.0–46.0)
Hemoglobin: 10.2 g/dL — ABNORMAL LOW (ref 12.0–15.0)
Immature Granulocytes: 1 %
Lymphocytes Relative: 9 %
Lymphs Abs: 0.6 10*3/uL — ABNORMAL LOW (ref 0.7–4.0)
MCH: 32.5 pg (ref 26.0–34.0)
MCHC: 33.4 g/dL (ref 30.0–36.0)
MCV: 97.1 fL (ref 80.0–100.0)
Monocytes Absolute: 0.5 10*3/uL (ref 0.1–1.0)
Monocytes Relative: 8 %
Neutro Abs: 5.2 10*3/uL (ref 1.7–7.7)
Neutrophils Relative %: 78 %
Platelet Count: 373 10*3/uL (ref 150–400)
RBC: 3.14 MIL/uL — ABNORMAL LOW (ref 3.87–5.11)
RDW: 16.7 % — ABNORMAL HIGH (ref 11.5–15.5)
WBC Count: 6.6 10*3/uL (ref 4.0–10.5)
nRBC: 0 % (ref 0.0–0.2)

## 2019-07-21 LAB — CMP (CANCER CENTER ONLY)
ALT: 8 U/L (ref 0–44)
AST: 12 U/L — ABNORMAL LOW (ref 15–41)
Albumin: 3.3 g/dL — ABNORMAL LOW (ref 3.5–5.0)
Alkaline Phosphatase: 94 U/L (ref 38–126)
Anion gap: 9 (ref 5–15)
BUN: 11 mg/dL (ref 8–23)
CO2: 22 mmol/L (ref 22–32)
Calcium: 9.3 mg/dL (ref 8.9–10.3)
Chloride: 106 mmol/L (ref 98–111)
Creatinine: 1.05 mg/dL — ABNORMAL HIGH (ref 0.44–1.00)
GFR, Est AFR Am: >60 mL/min (ref >60)
GFR, Estimated: 56 mL/min — ABNORMAL LOW (ref >60)
Glucose, Bld: 89 mg/dL (ref 70–99)
Potassium: 3.7 mmol/L (ref 3.5–5.1)
Sodium: 137 mmol/L (ref 135–145)
Total Bilirubin: 0.3 mg/dL (ref 0.3–1.2)
Total Protein: 7.6 g/dL (ref 6.5–8.1)

## 2019-07-21 LAB — TSH: TSH: 6.173 u[IU]/mL — ABNORMAL HIGH (ref 0.308–3.960)

## 2019-07-21 MED ORDER — SODIUM CHLORIDE 0.9 % IV SOLN
Freq: Once | INTRAVENOUS | Status: AC
  Filled 2019-07-21: qty 250

## 2019-07-21 MED ORDER — SODIUM CHLORIDE 0.9 % IV SOLN
1500.0000 mg | Freq: Once | INTRAVENOUS | Status: AC
  Administered 2019-07-21: 14:00:00 1500 mg via INTRAVENOUS
  Filled 2019-07-21: qty 30

## 2019-07-21 NOTE — Patient Instructions (Addendum)
[facility] Discharge Instructions for Patients Receiving Chemotherapy  Today you received the following chemotherapy agents: Imfinzi.  To help prevent nausea and vomiting after your treatment, we encourage you to take your nausea medication as directed.   If you develop nausea and vomiting that is not controlled by your nausea medication, call the clinic.   BELOW ARE SYMPTOMS THAT SHOULD BE REPORTED IMMEDIATELY:  *FEVER GREATER THAN 100.5 F  *CHILLS WITH OR WITHOUT FEVER  NAUSEA AND VOMITING THAT IS NOT CONTROLLED WITH YOUR NAUSEA MEDICATION  *UNUSUAL SHORTNESS OF BREATH  *UNUSUAL BRUISING OR BLEEDING  TENDERNESS IN MOUTH AND THROAT WITH OR WITHOUT PRESENCE OF ULCERS  *URINARY PROBLEMS  *BOWEL PROBLEMS  UNUSUAL RASH Items with * indicate a potential emergency and should be followed up as soon as possible.  Feel free to call the clinic should you have any questions or concerns. The clinic phone number is [phone].  Please show the CHEMO ALERT CARD at check-in to the Emergency Department and triage nurse.  Durvalumab injection What is this medicine? DURVALUMAB (dur VAL ue mab) is a monoclonal antibody. It is used to treat urothelial cancer and lung cancer. This medicine may be used for other purposes; ask your health care provider or pharmacist if you have questions. COMMON BRAND NAME(S): IMFINZI What should I tell my health care provider before I take this medicine? They need to know if you have any of these conditions:  diabetes  immune system problems  infection  inflammatory bowel disease  kidney disease  liver disease  lung or breathing disease  lupus  organ transplant  stomach or intestine problems  thyroid disease  an unusual or allergic reaction to durvalumab, other medicines, foods, dyes, or preservatives  pregnant or trying to get pregnant  breast-feeding How should I use this medicine? This medicine is for infusion  into a vein. It is given by a health care professional in a hospital or clinic setting. A special MedGuide will be given to you before each treatment. Be sure to read this information carefully each time. Talk to your pediatrician regarding the use of this medicine in children. Special care may be needed. Overdosage: If you think you have taken too much of this medicine contact a poison control center or emergency room at once. NOTE: This medicine is only for you. Do not share this medicine with others. What if I miss a dose? It is important not to miss your dose. Call your doctor or health care professional if you are unable to keep an appointment. What may interact with this medicine? Interactions have not been studied. This list may not describe all possible interactions. Give your health care provider a list of all the medicines, herbs, non-prescription drugs, or dietary supplements you use. Also tell them if you smoke, drink alcohol, or use illegal drugs. Some items may interact with your medicine. What should I watch for while using this medicine? This drug may make you feel generally unwell. Continue your course of treatment even though you feel ill unless your doctor tells you to stop. You may need blood work done while you are taking this medicine. Do not become pregnant while taking this medicine or for 3 months after stopping it. Women should inform their doctor if they wish to become pregnant or think they might be pregnant. There is a potential for serious side effects to an unborn child. Talk to your health care professional or pharmacist for more information. Do not breast-feed   an infant while taking this medicine or for 3 months after stopping it. What side effects may I notice from receiving this medicine? Side effects that you should report to your doctor or health care professional as soon as possible:  allergic reactions like skin rash, itching or hives, swelling of the face,  lips, or tongue  black, tarry stools  bloody or watery diarrhea  breathing problems  change in emotions or moods  change in sex drive  changes in vision  chest pain or chest tightness  chills  confusion  cough  facial flushing  fever  headache  signs and symptoms of high blood sugar such as dizziness; dry mouth; dry skin; fruity breath; nausea; stomach pain; increased hunger or thirst; increased urination  signs and symptoms of liver injury like dark yellow or brown urine; general ill feeling or flu-like symptoms; light-colored stools; loss of appetite; nausea; right upper belly pain; unusually weak or tired; yellowing of the eyes or skin  stomach pain  trouble passing urine or change in the amount of urine  weight gain or weight loss Side effects that usually do not require medical attention (report these to your doctor or health care professional if they continue or are bothersome):  bone pain  constipation  loss of appetite  muscle pain  nausea  swelling of the ankles, feet, hands  tiredness This list may not describe all possible side effects. Call your doctor for medical advice about side effects. You may report side effects to FDA at 1-800-FDA-1088. Where should I keep my medicine? This drug is given in a hospital or clinic and will not be stored at home. NOTE: This sheet is a summary. It may not cover all possible information. If you have questions about this medicine, talk to your doctor, pharmacist, or health care provider.  2020 Elsevier/Gold Standard (2016-05-27 19:25:04)    

## 2019-07-21 NOTE — Telephone Encounter (Signed)
  Radiation Oncology         ([phone]) [phone] ________________________________  Name: Betty Anderson MRN: [id number]  Date of Service: 07/21/2019  DOB: 03-31-1956  Post Treatment Telephone Note  Diagnosis: Stage IIIA, [id number], NSCLC, cannot further classify of the RUL  Interval Since Last Radiation:  6 weeks   05/02/19-06/16/19: The right lung target was treated to 60 Gy in 30 fraction followed by a 6 Gy boost in 3 fractions.  Narrative:  The patient was contacted today for routine follow-up. During treatment she did very well with radiotherapy and did not have significant desquamation. She did have difficulty with esophagitis.   Impression/Plan: 1. Stage IIIA, [id number], NSCLC, cannot further classify of the RUL. The patient has been doing well since completion of radiotherapy according to notes from [place], PAC. I encouraged the patient to call back in my voicemail I left and I discussed that we would be happy to continue to follow her as needed, but she will also continue to follow up with Dr. Julien Nordmann in medical oncology.     Carola Rhine, PAC

## 2019-07-21 NOTE — Progress Notes (Signed)
Patient was identified to be at risk for malnutrition on the MST secondary to weight loss and poor appetite. Weight was documented as 156.8 pounds April 15 decreased from 173 pounds January 21.  I spoke briefly with patient who reports she is eating well and has a good appetite.  She denies nutrition impact symptoms. She reports drinking 80 ounces of water a day.  She has no questions or concerns.  I provided patient with brief education on weight maintenance.  Provided her with my contact information for questions or concerns.

## 2019-08-02 NOTE — Progress Notes (Signed)
  Radiation Oncology         ([phone]) [phone] ________________________________  Name: Betty Anderson MRN: [id number]  Date: 06/16/2019  DOB: December 06, 1955  End of Treatment Note  Diagnosis:  Lung cancer     Indication for treatment::  curative       Radiation treatment dates:   05/02/19 - 06/16/19  Site/dose:   The patient was treated to the disease within the right lung initially to a dose of 60 Gy using a 4 field, 3-D conformal technique. The patient then received a cone down boost treatment for an additional 6 Gy. This yielded a final total dose of 66 Gy.   Narrative: The patient tolerated radiation treatment relatively well.   The patient did experience esophagitis during the course of treatment which required management.   Plan: The patient has completed radiation treatment. The patient will return to radiation oncology clinic for routine followup in one month. I advised the patient to call or return sooner if they have any questions or concerns related to their recovery or treatment. ________________________________  Jodelle Gross, M.D., Ph.D.

## 2019-08-03 NOTE — Progress Notes (Signed)
[name] OFFICE PROGRESS NOTE  [name], MD [address] [place] [address]  DIAGNOSIS: Stage IIIA (T1b, N2, M0) non-small cell lung cancer presented with right upper lobe lung nodule in addition to mediastinal lymphadenopathy diagnosed in January 2021.  PRIOR THERAPY:  Concurrent chemoradiation with weekly carboplatin for AUC of 2 and paclitaxel 45 NG/M2. Status post5cycles. Last dose received on 05/30/19  CURRENT THERAPY: Consolidation immunotherapy with Imfinzi 1500 mg/kg IV every 4 weeks.  First dose expected on 07/21/2019. Status post 1 cycle.   INTERVAL HISTORY: Betty Anderson 64 y.o. female returns to the clinic for a follow up visit accompanied by her husband. The patient is feeling well today without any concerning complaints. The patient had her first cycle of immunotherapy with Imfinzi and tolerated it well without any adverse side effects. Denies any fever, chills, or night sweats. She lost a few pounds since her last appointment but denies any noticeable changes in her appetite or dietary habits. Denies any chest pain, shortness of breath, or hemoptysis. The patient continues to smoke approximately 1.5 packs of cigarettes per day. She notes an associated cough. Denies sore throat or nasal congestion. Denies any nausea, vomiting, diarrhea, or constipation. Denies any headache or visual changes. Denies any rashes or skin changes. The patient is here today for evaluation and to manage any adverse side effects of treatment.    MEDICAL HISTORY: Past Medical History:  Diagnosis Date  . COPD (chronic obstructive pulmonary disease) (HCC)    emphysema  . Hyperlipidemia   . Hypertension   . Lung nodule   . Paroxysmal atrial flutter ([facility])    per cardiology note  . Peripheral artery disease ([facility])   . Tobacco abuse   . Vitamin D deficiency   . Wears glasses     ALLERGIES:  has No Known Allergies.  MEDICATIONS:  Current Outpatient Medications   Medication Sig Dispense Refill  . albuterol (PROVENTIL) (2.5 MG/3ML) 0.083% nebulizer solution Take 3 mLs (2.5 mg total) by nebulization every 6 (six) hours as needed for wheezing or shortness of breath. 75 mL 12  . aspirin EC 81 MG tablet Take 1 tablet (81 mg total) by mouth daily. 90 tablet 3  . Cholecalciferol (DIALYVITE VITAMIN D 5000) 125 MCG (5000 UT) capsule Take 5,000 Units by mouth daily.    Marland Kitchen escitalopram (LEXAPRO) 10 MG tablet Take 10 mg by mouth daily.    . rosuvastatin (CRESTOR) 20 MG tablet Take 20 mg by mouth every evening.     . Tiotropium Bromide-Olodaterol (STIOLTO RESPIMAT) 2.5-2.5 MCG/ACT AERS Inhale 2 puffs into the lungs daily. 4 g 0  . vitamin B-12 (CYANOCOBALAMIN) 1000 MCG tablet Take 1,000 mcg by mouth daily.     No current facility-administered medications for this visit.    SURGICAL HISTORY:  Past Surgical History:  Procedure Laterality Date  . BRONCHIAL NEEDLE ASPIRATION BIOPSY  04/12/2019   Procedure: BRONCHIAL NEEDLE ASPIRATION BIOPSIES;  Surgeon: Garner Nash, DO;  Location: [facility];  Service: Cardiopulmonary;;  . ENDOBRONCHIAL ULTRASOUND N/A 04/12/2019   Procedure: ENDOBRONCHIAL ULTRASOUND;  Surgeon: Garner Nash, DO;  Location: [facility];  Service: Cardiopulmonary;  Laterality: N/A;  . OVARIAN CYST REMOVAL    . TONSILLECTOMY    . TUBAL LIGATION    . VIDEO BRONCHOSCOPY N/A 04/12/2019   Procedure: VIDEO BRONCHOSCOPY WITHOUT FLUORO;  Surgeon: Garner Nash, DO;  Location: [facility];  Service: Cardiopulmonary;  Laterality: N/A;    REVIEW OF SYSTEMS:   Review of  Systems  Constitutional: Positive for weight loss. Negative for appetite change, chills, fatigue, and fever. HENT: Negative for mouth sores, nosebleeds, sore throat and trouble swallowing.   Eyes: Negative for eye problems and icterus.  Respiratory: Positive for baseline shortness of breath and cough. Negative for hemoptysis and wheezing.   Cardiovascular: Negative for chest  pain and leg swelling.  Gastrointestinal: Negative for abdominal pain, constipation, diarrhea, nausea and vomiting.  Genitourinary: Negative for bladder incontinence, difficulty urinating, dysuria, frequency and hematuria.   Musculoskeletal: Negative for back pain, gait problem, neck pain and neck stiffness.  Skin: Negative for itching and rash.  Neurological: Negative for dizziness, extremity weakness, gait problem, headaches, light-headedness and seizures.  Hematological: Negative for adenopathy. Does not bruise/bleed easily.  Psychiatric/Behavioral: Negative for confusion, depression and sleep disturbance. The patient is not nervous/anxious.     PHYSICAL EXAMINATION:  Blood pressure 98/64, pulse 99, temperature 98.5 F (36.9 C), temperature source Temporal, resp. rate 19, height 5\' 2"  (1.575 m), weight 152 lb 9.6 oz (69.2 kg), SpO2 100 %.  ECOG PERFORMANCE STATUS: 1 - Symptomatic but completely ambulatory  Physical Exam  Constitutional: Oriented to person, place, and time and well-developed, well-nourished, and in no distress.  HENT:  Head: Normocephalic and atraumatic.  Mouth/Throat: Oropharynx is clear and moist. No oropharyngeal exudate.  Eyes: Conjunctivae are normal. Right eye exhibits no discharge. Left eye exhibits no discharge. No scleral icterus.  Neck: Normal range of motion. Neck supple.  Cardiovascular: Normal rate, regular rhythm, normal heart sounds and intact distal pulses.   Pulmonary/Chest: Effort normal and breath sounds normal. No respiratory distress. No wheezes. No rales.  Abdominal: Soft. Bowel sounds are normal. Exhibits no distension and no mass. There is no tenderness.  Musculoskeletal: Normal range of motion. Exhibits no edema.  Lymphadenopathy:    No cervical adenopathy.  Neurological: Alert and oriented to person, place, and time. Exhibits normal muscle tone. Gait normal. Coordination normal.  Skin: Skin is warm and dry. No rash noted. Not diaphoretic. No  erythema. No pallor.  Psychiatric: Mood, memory and judgment normal.  Vitals reviewed.  LABORATORY DATA: Lab Results  Component Value Date   WBC 9.4 08/04/2019   HGB 10.6 (L) 08/04/2019   HCT 32.2 (L) 08/04/2019   MCV 101.3 (H) 08/04/2019   PLT 303 08/04/2019      Chemistry      Component Value Date/Time   NA 137 07/21/2019 1202   K 3.7 07/21/2019 1202   CL 106 07/21/2019 1202   CO2 22 07/21/2019 1202   BUN 11 07/21/2019 1202   CREATININE 1.05 (H) 07/21/2019 1202      Component Value Date/Time   CALCIUM 9.3 07/21/2019 1202   ALKPHOS 94 07/21/2019 1202   AST 12 (L) 07/21/2019 1202   ALT 8 07/21/2019 1202   BILITOT 0.3 07/21/2019 1202       RADIOGRAPHIC STUDIES:  CT Chest W Contrast  Result Date: 07/14/2019 CLINICAL DATA:  64 year old female with history of stage III lung cancer status post chemotherapy and radiation therapy. EXAM: CT CHEST WITH CONTRAST TECHNIQUE: Multidetector CT imaging of the chest was performed during intravenous contrast administration. CONTRAST:  9mL OMNIPAQUE IOHEXOL 300 MG/ML  SOLN COMPARISON:  Chest CT 03/30/2019.  PET-CT 04/14/2019. FINDINGS: Cardiovascular: Heart size is normal. There is no significant pericardial fluid, thickening or pericardial calcification. There is aortic atherosclerosis, as well as atherosclerosis of the great vessels of the mediastinum and the coronary arteries, including calcified atherosclerotic plaque in the left main, left anterior  descending and left circumflex coronary arteries. Mediastinum/Nodes: Borderline enlarged right paratracheal lymph node currently measuring 1.2 cm in short axis, decreased from 1.9 cm on prior PET-CT. Previously enlarged subcarinal lymph node currently measures only 8 mm in short axis. No other mediastinal or hilar lymphadenopathy noted on today's examination. Esophagus is unremarkable in appearance. No axillary lymphadenopathy. Lungs/Pleura: Known right upper lobe neoplasm has decreased in size  and currently measures 1.1 x 0.8 cm (axial image 44 of series 10). This again demonstrates macrolobulated and spiculated margins. No other definite suspicious appearing pulmonary nodules or masses are noted. There are few scattered 2-3 mm pulmonary nodules noted in the periphery of the lungs bilaterally, nonspecific, but similar to the prior study and favored to represent benign areas of mucoid impaction within terminal bronchioles. No acute consolidative airspace disease. No pleural effusions. Mild diffuse bronchial wall thickening with mild centrilobular and paraseptal emphysema. Mild diffuse ground-glass attenuation noted most evident throughout the mid to lower lungs bilaterally, stable compared to the prior examination. Upper Abdomen: Aortic atherosclerosis. Small volume of ascites most evident adjacent to the liver and spleen. Musculoskeletal: There are no aggressive appearing lytic or blastic lesions noted in the visualized portions of the skeleton. IMPRESSION: 1. Today's study demonstrates a positive response to therapy with partial regression of the right upper lobe pulmonary nodule and regression of mediastinal lymphadenopathy, as detailed above. 2. 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 Outpatient referral to pulmonology for further evaluation should be considered. 3. Small volume of ascites in the visualized upper abdomen. 4. Diffuse bronchial wall thickening with mild centrilobular and paraseptal emphysema; imaging findings compatible with reported clinical history of underlying COPD. 5. Aortic atherosclerosis, in addition to left main and 2 vessel coronary artery disease. Please note that although the presence of coronary artery calcium documents the presence of coronary artery disease, the severity of this disease and any potential stenosis  cannot be assessed on this non-gated CT examination. Assessment for potential risk factor modification, dietary therapy or pharmacologic therapy may be warranted, if clinically indicated. Aortic Atherosclerosis (ICD10-I70.0). Electronically Signed   By: Vinnie Langton M.D.   On: 07/14/2019 05:56     ASSESSMENT/PLAN:  This is a very pleasant 64 year old Caucasian female diagnosed with stage IIIa non-small cell lung cancer.  She presented with a right upper lobe lung mass and mediastinal lymphadenopathy.  She was diagnosed in January 2021.  She recently completed 5 cycles of weekly concurrent chemoradiation with carboplatin for an AUC of 2 and paclitaxel 45 mg/m. She is status post 5 cycles of treatment.   She is currently undergoing consolidation immunotherapy with Imfinzi 1500 mg IV every 4 weeks.  She is status post 1 cycle and she tolerated it well without any concerning adverse side effects.  Recommend that the patient continue on the same treatment at the same dose.  She will receive cycle #2 in 2 weeks as planned.  The patient was strongly encouraged to quit smoking. She recently had formal smoking cessation counseling. She is not interested in quitting smoking at this time.   The patient was advised to call immediately if she has any concerning symptoms in the interval. The patient voices understanding of current disease status and treatment options and is in agreement with the current care plan. All questions were answered. The patient knows to call the clinic with any problems, questions or concerns. We  can certainly see the patient much sooner if necessary       No orders of the defined types were placed in this encounter.    Cassandra L Heilingoetter, PA-C 08/04/19

## 2019-08-04 ENCOUNTER — Inpatient Hospital Stay: Payer: Medicare Other

## 2019-08-04 ENCOUNTER — Other Ambulatory Visit: Payer: Self-pay

## 2019-08-04 ENCOUNTER — Inpatient Hospital Stay: Payer: Medicare Other | Attending: Internal Medicine | Admitting: Physician Assistant

## 2019-08-04 VITALS — BP 98/64 | HR 99 | Temp 98.5°F | Resp 19 | Ht 62.0 in | Wt 152.6 lb

## 2019-08-04 DIAGNOSIS — R0602 Shortness of breath: Secondary | ICD-10-CM | POA: Insufficient documentation

## 2019-08-04 DIAGNOSIS — C3411 Malignant neoplasm of upper lobe, right bronchus or lung: Secondary | ICD-10-CM | POA: Diagnosis not present

## 2019-08-04 DIAGNOSIS — F17210 Nicotine dependence, cigarettes, uncomplicated: Secondary | ICD-10-CM | POA: Diagnosis not present

## 2019-08-04 DIAGNOSIS — Z9221 Personal history of antineoplastic chemotherapy: Secondary | ICD-10-CM | POA: Diagnosis not present

## 2019-08-04 DIAGNOSIS — I700 Atherosclerosis of aorta: Secondary | ICD-10-CM | POA: Diagnosis not present

## 2019-08-04 DIAGNOSIS — R05 Cough: Secondary | ICD-10-CM | POA: Insufficient documentation

## 2019-08-04 DIAGNOSIS — F17200 Nicotine dependence, unspecified, uncomplicated: Secondary | ICD-10-CM | POA: Diagnosis not present

## 2019-08-04 DIAGNOSIS — R590 Localized enlarged lymph nodes: Secondary | ICD-10-CM | POA: Diagnosis not present

## 2019-08-04 DIAGNOSIS — R188 Other ascites: Secondary | ICD-10-CM | POA: Insufficient documentation

## 2019-08-04 DIAGNOSIS — I2510 Atherosclerotic heart disease of native coronary artery without angina pectoris: Secondary | ICD-10-CM | POA: Insufficient documentation

## 2019-08-04 DIAGNOSIS — Z5112 Encounter for antineoplastic immunotherapy: Secondary | ICD-10-CM | POA: Diagnosis present

## 2019-08-04 DIAGNOSIS — Z923 Personal history of irradiation: Secondary | ICD-10-CM | POA: Insufficient documentation

## 2019-08-04 DIAGNOSIS — Z79899 Other long term (current) drug therapy: Secondary | ICD-10-CM | POA: Diagnosis not present

## 2019-08-04 DIAGNOSIS — R634 Abnormal weight loss: Secondary | ICD-10-CM | POA: Insufficient documentation

## 2019-08-04 DIAGNOSIS — J449 Chronic obstructive pulmonary disease, unspecified: Secondary | ICD-10-CM | POA: Diagnosis not present

## 2019-08-04 LAB — CMP (CANCER CENTER ONLY)
ALT: 9 U/L (ref 0–44)
AST: 13 U/L — ABNORMAL LOW (ref 15–41)
Albumin: 3.5 g/dL (ref 3.5–5.0)
Alkaline Phosphatase: 97 U/L (ref 38–126)
Anion gap: 11 (ref 5–15)
BUN: 15 mg/dL (ref 8–23)
CO2: 25 mmol/L (ref 22–32)
Calcium: 9.7 mg/dL (ref 8.9–10.3)
Chloride: 108 mmol/L (ref 98–111)
Creatinine: 0.97 mg/dL (ref 0.44–1.00)
GFR, Est AFR Am: >60 mL/min (ref >60)
GFR, Estimated: >60 mL/min (ref >60)
Glucose, Bld: 99 mg/dL (ref 70–99)
Potassium: 4.1 mmol/L (ref 3.5–5.1)
Sodium: 144 mmol/L (ref 135–145)
Total Bilirubin: 0.3 mg/dL (ref 0.3–1.2)
Total Protein: 7.7 g/dL (ref 6.5–8.1)

## 2019-08-04 LAB — CBC WITH DIFFERENTIAL (CANCER CENTER ONLY)
Abs Immature Granulocytes: 0.03 10*3/uL (ref 0.00–0.07)
Basophils Absolute: 0.1 10*3/uL (ref 0.0–0.1)
Basophils Relative: 1 %
Eosinophils Absolute: 0.2 10*3/uL (ref 0.0–0.5)
Eosinophils Relative: 2 %
HCT: 32.2 % — ABNORMAL LOW (ref 36.0–46.0)
Hemoglobin: 10.6 g/dL — ABNORMAL LOW (ref 12.0–15.0)
Immature Granulocytes: 0 %
Lymphocytes Relative: 5 %
Lymphs Abs: 0.5 10*3/uL — ABNORMAL LOW (ref 0.7–4.0)
MCH: 33.3 pg (ref 26.0–34.0)
MCHC: 32.9 g/dL (ref 30.0–36.0)
MCV: 101.3 fL — ABNORMAL HIGH (ref 80.0–100.0)
Monocytes Absolute: 0.6 10*3/uL (ref 0.1–1.0)
Monocytes Relative: 6 %
Neutro Abs: 8.1 10*3/uL — ABNORMAL HIGH (ref 1.7–7.7)
Neutrophils Relative %: 86 %
Platelet Count: 303 10*3/uL (ref 150–400)
RBC: 3.18 MIL/uL — ABNORMAL LOW (ref 3.87–5.11)
RDW: 15.3 % (ref 11.5–15.5)
WBC Count: 9.4 10*3/uL (ref 4.0–10.5)
nRBC: 0 % (ref 0.0–0.2)

## 2019-08-04 LAB — TSH: TSH: 1.51 u[IU]/mL (ref 0.308–3.960)

## 2019-08-12 NOTE — Progress Notes (Signed)
Pharmacist Chemotherapy Monitoring - Follow Up Assessment    I verify that I have reviewed each item in the below checklist:  . Regimen for the patient is scheduled for the appropriate day and plan matches scheduled date. Marland Kitchen Appropriate non-routine labs are ordered dependent on drug ordered. . If applicable, additional medications reviewed and ordered per protocol based on lifetime cumulative doses and/or treatment regimen.   Plan for follow-up and/or issues identified: No . I-vent associated with next due treatment: No . MD and/or nursing notified: No  Lecil Tapp K 08/12/2019 10:11 AM

## 2019-08-17 ENCOUNTER — Encounter: Payer: Self-pay | Admitting: *Deleted

## 2019-08-18 ENCOUNTER — Inpatient Hospital Stay: Payer: Medicare Other

## 2019-08-18 ENCOUNTER — Encounter: Payer: Self-pay | Admitting: Internal Medicine

## 2019-08-18 ENCOUNTER — Other Ambulatory Visit: Payer: Self-pay

## 2019-08-18 ENCOUNTER — Inpatient Hospital Stay: Payer: Medicare Other | Admitting: Internal Medicine

## 2019-08-18 VITALS — HR 79

## 2019-08-18 VITALS — BP 120/72 | HR 102 | Temp 98.1°F | Resp 18 | Ht 62.0 in | Wt 153.5 lb

## 2019-08-18 DIAGNOSIS — C3411 Malignant neoplasm of upper lobe, right bronchus or lung: Secondary | ICD-10-CM | POA: Diagnosis not present

## 2019-08-18 DIAGNOSIS — Z7189 Other specified counseling: Secondary | ICD-10-CM | POA: Diagnosis not present

## 2019-08-18 DIAGNOSIS — Z5112 Encounter for antineoplastic immunotherapy: Secondary | ICD-10-CM | POA: Diagnosis not present

## 2019-08-18 LAB — CMP (CANCER CENTER ONLY)
ALT: 7 U/L (ref 0–44)
AST: 11 U/L — ABNORMAL LOW (ref 15–41)
Albumin: 3.4 g/dL — ABNORMAL LOW (ref 3.5–5.0)
Alkaline Phosphatase: 108 U/L (ref 38–126)
Anion gap: 10 (ref 5–15)
BUN: 17 mg/dL (ref 8–23)
CO2: 23 mmol/L (ref 22–32)
Calcium: 9.5 mg/dL (ref 8.9–10.3)
Chloride: 106 mmol/L (ref 98–111)
Creatinine: 1.19 mg/dL — ABNORMAL HIGH (ref 0.44–1.00)
GFR, Est AFR Am: 55 mL/min — ABNORMAL LOW (ref >60)
GFR, Estimated: 48 mL/min — ABNORMAL LOW (ref >60)
Glucose, Bld: 106 mg/dL — ABNORMAL HIGH (ref 70–99)
Potassium: 3.6 mmol/L (ref 3.5–5.1)
Sodium: 139 mmol/L (ref 135–145)
Total Bilirubin: <0.2 mg/dL — ABNORMAL LOW (ref 0.3–1.2)
Total Protein: 7.5 g/dL (ref 6.5–8.1)

## 2019-08-18 LAB — CBC WITH DIFFERENTIAL (CANCER CENTER ONLY)
Abs Immature Granulocytes: 0.01 10*3/uL (ref 0.00–0.07)
Basophils Absolute: 0.1 10*3/uL (ref 0.0–0.1)
Basophils Relative: 1 %
Eosinophils Absolute: 0.2 10*3/uL (ref 0.0–0.5)
Eosinophils Relative: 3 %
HCT: 32.8 % — ABNORMAL LOW (ref 36.0–46.0)
Hemoglobin: 10.6 g/dL — ABNORMAL LOW (ref 12.0–15.0)
Immature Granulocytes: 0 %
Lymphocytes Relative: 13 %
Lymphs Abs: 0.7 10*3/uL (ref 0.7–4.0)
MCH: 32.3 pg (ref 26.0–34.0)
MCHC: 32.3 g/dL (ref 30.0–36.0)
MCV: 100 fL (ref 80.0–100.0)
Monocytes Absolute: 0.4 10*3/uL (ref 0.1–1.0)
Monocytes Relative: 9 %
Neutro Abs: 3.8 10*3/uL (ref 1.7–7.7)
Neutrophils Relative %: 74 %
Platelet Count: 269 10*3/uL (ref 150–400)
RBC: 3.28 MIL/uL — ABNORMAL LOW (ref 3.87–5.11)
RDW: 14.1 % (ref 11.5–15.5)
WBC Count: 5.1 10*3/uL (ref 4.0–10.5)
nRBC: 0 % (ref 0.0–0.2)

## 2019-08-18 MED ORDER — SODIUM CHLORIDE 0.9 % IV SOLN
Freq: Once | INTRAVENOUS | Status: AC
  Filled 2019-08-18: qty 250

## 2019-08-18 MED ORDER — SODIUM CHLORIDE 0.9 % IV SOLN
1500.0000 mg | Freq: Once | INTRAVENOUS | Status: AC
  Administered 2019-08-18: 1500 mg via INTRAVENOUS
  Filled 2019-08-18: qty 30

## 2019-08-18 NOTE — Patient Instructions (Signed)
Steps to Quit Smoking Smoking tobacco is the leading cause of preventable death. It can affect almost every organ in the body. Smoking puts you and people around you at risk for many serious, long-lasting (chronic) diseases. Quitting smoking can be hard, but it is one of the best things that you can do for your health. It is never too late to quit. How do I get ready to quit? When you decide to quit smoking, make a plan to help you succeed. Before you quit:  Pick a date to quit. Set a date within the next 2 weeks to give you time to prepare.  Write down the reasons why you are quitting. Keep this list in places where you will see it often.  Tell your family, friends, and co-workers that you are quitting. Their support is important.  Talk with your doctor about the choices that may help you quit.  Find out if your health insurance will pay for these treatments.  Know the people, places, things, and activities that make you want to smoke (triggers). Avoid them. What first steps can I take to quit smoking?  Throw away all cigarettes at home, at work, and in your car.  Throw away the things that you use when you smoke, such as ashtrays and lighters.  Clean your car. Make sure to empty the ashtray.  Clean your home, including curtains and carpets. What can I do to help me quit smoking? Talk with your doctor about taking medicines and seeing a counselor at the same time. You are more likely to succeed when you do both.  If you are pregnant or breastfeeding, talk with your doctor about counseling or other ways to quit smoking. Do not take medicine to help you quit smoking unless your doctor tells you to do so. To quit smoking: Quit right away  Quit smoking totally, instead of slowly cutting back on how much you smoke over a period of time.  Go to counseling. You are more likely to quit if you go to counseling sessions regularly. Take medicine You may take medicines to help you quit. Some  medicines need a prescription, and some you can buy over-the-counter. Some medicines may contain a drug called nicotine to replace the nicotine in cigarettes. Medicines may:  Help you to stop having the desire to smoke (cravings).  Help to stop the problems that come when you stop smoking (withdrawal symptoms). Your doctor may ask you to use:  Nicotine patches, gum, or lozenges.  Nicotine inhalers or sprays.  Non-nicotine medicine that is taken by mouth. Find resources Find resources and other ways to help you quit smoking and remain smoke-free after you quit. These resources are most helpful when you use them often. They include:  Online chats with a counselor.  Phone quitlines.  Printed self-help materials.  Support groups or group counseling.  Text messaging programs.  Mobile phone apps. Use apps on your mobile phone or tablet that can help you stick to your quit plan. There are many free apps for mobile phones and tablets as well as websites. Examples include Quit Guide from the CDC and smokefree.gov  What things can I do to make it easier to quit?   Talk to your family and friends. Ask them to support and encourage you.  Call a phone quitline (1-800-QUIT-NOW), reach out to support groups, or work with a counselor.  Ask people who smoke to not smoke around you.  Avoid places that make you want to smoke,   such as: ? Bars. ? Parties. ? Smoke-break areas at work.  Spend time with people who do not smoke.  Lower the stress in your life. Stress can make you want to smoke. Try these things to help your stress: ? Getting regular exercise. ? Doing deep-breathing exercises. ? Doing yoga. ? Meditating. ? Doing a body scan. To do this, close your eyes, focus on one area of your body at a time from head to toe. Notice which parts of your body are tense. Try to relax the muscles in those areas. How will I feel when I quit smoking? Day 1 to 3 weeks Within the first 24 hours,  you may start to have some problems that come from quitting tobacco. These problems are very bad 2-3 days after you quit, but they do not often last for more than 2-3 weeks. You may get these symptoms:  Mood swings.  Feeling restless, nervous, angry, or annoyed.  Trouble concentrating.  Dizziness.  Strong desire for high-sugar foods and nicotine.  Weight gain.  Trouble pooping (constipation).  Feeling like you may vomit (nausea).  Coughing or a sore throat.  Changes in how the medicines that you take for other issues work in your body.  Depression.  Trouble sleeping (insomnia). Week 3 and afterward After the first 2-3 weeks of quitting, you may start to notice more positive results, such as:  Better sense of smell and taste.  Less coughing and sore throat.  Slower heart rate.  Lower blood pressure.  Clearer skin.  Better breathing.  Fewer sick days. Quitting smoking can be hard. Do not give up if you fail the first time. Some people need to try a few times before they succeed. Do your best to stick to your quit plan, and talk with your doctor if you have any questions or concerns. Summary  Smoking tobacco is the leading cause of preventable death. Quitting smoking can be hard, but it is one of the best things that you can do for your health.  When you decide to quit smoking, make a plan to help you succeed.  Quit smoking right away, not slowly over a period of time.  When you start quitting, seek help from your doctor, family, or friends. This information is not intended to replace advice given to you by your health care provider. Make sure you discuss any questions you have with your health care provider. Document Revised: 12/10/2018 Document Reviewed: 06/05/2018 Elsevier Patient Education  2020 Elsevier Inc.  

## 2019-08-18 NOTE — Progress Notes (Signed)
[facility] [phone]   Fax:([phone]) [place], MD [place] [address]  DIAGNOSIS: Stage IIIA (T1b, N2, M0) non-small cell lung cancer presented with right upper lobe lung nodule in addition to mediastinal lymphadenopathy diagnosed in January 2021.  PRIOR THERAPY: Concurrent chemoradiation with weekly carboplatin for AUC of 2 and paclitaxel 45 NG/M2. Status post5cycles. Last dose received on 05/30/19  CURRENT THERAPY: Consolidation immunotherapy with Imfinzi 1500 mg/kg IV every 4 weeks. First dose expected on 07/21/2019. Status post 1 cycle.   INTERVAL HISTORY: Betty Anderson 64 y.o. female returns to the clinic today for follow-up visit accompanied by her husband.  The patient is feeling fine today with no concerning complaints except for fatigue and she sleeps a lot.  She continues to smoke at regular basis and unfortunately unwilling to quit.  She denied having any chest pain but has shortness of breath with exertion with cough and no hemoptysis.  She denied having any fever or chills.  She has no nausea, vomiting, diarrhea or constipation.  She denied having any headache or visual changes.  The patient is here today for evaluation before starting cycle #2 of her consolidation treatment with immunotherapy.  MEDICAL HISTORY: Past Medical History:  Diagnosis Date  . COPD (chronic obstructive pulmonary disease) (HCC)    emphysema  . Hyperlipidemia   . Hypertension   . Lung nodule   . Paroxysmal atrial flutter ([facility])    per cardiology note  . Peripheral artery disease ([facility])   . Tobacco abuse   . Vitamin D deficiency   . Wears glasses     ALLERGIES:  has No Known Allergies.  MEDICATIONS:  Current Outpatient Medications  Medication Sig Dispense Refill  . albuterol (PROVENTIL) (2.5 MG/3ML) 0.083% nebulizer solution Take 3 mLs (2.5 mg total) by nebulization every 6 (six) hours as needed for  wheezing or shortness of breath. 75 mL 12  . aspirin EC 81 MG tablet Take 1 tablet (81 mg total) by mouth daily. 90 tablet 3  . Cholecalciferol (DIALYVITE VITAMIN D 5000) 125 MCG (5000 UT) capsule Take 5,000 Units by mouth daily.    Marland Kitchen escitalopram (LEXAPRO) 10 MG tablet Take 10 mg by mouth daily.    . rosuvastatin (CRESTOR) 20 MG tablet Take 20 mg by mouth every evening.     . Tiotropium Bromide-Olodaterol (STIOLTO RESPIMAT) 2.5-2.5 MCG/ACT AERS Inhale 2 puffs into the lungs daily. 4 g 0  . vitamin B-12 (CYANOCOBALAMIN) 1000 MCG tablet Take 1,000 mcg by mouth daily.     No current facility-administered medications for this visit.    SURGICAL HISTORY:  Past Surgical History:  Procedure Laterality Date  . BRONCHIAL NEEDLE ASPIRATION BIOPSY  04/12/2019   Procedure: BRONCHIAL NEEDLE ASPIRATION BIOPSIES;  Surgeon: Garner Nash, DO;  Location: [facility];  Service: Cardiopulmonary;;  . ENDOBRONCHIAL ULTRASOUND N/A 04/12/2019   Procedure: ENDOBRONCHIAL ULTRASOUND;  Surgeon: Garner Nash, DO;  Location: [facility];  Service: Cardiopulmonary;  Laterality: N/A;  . OVARIAN CYST REMOVAL    . TONSILLECTOMY    . TUBAL LIGATION    . VIDEO BRONCHOSCOPY N/A 04/12/2019   Procedure: VIDEO BRONCHOSCOPY WITHOUT FLUORO;  Surgeon: Garner Nash, DO;  Location: [facility];  Service: Cardiopulmonary;  Laterality: N/A;    REVIEW OF SYSTEMS:  A comprehensive review of systems was negative except for: Constitutional: positive for fatigue Respiratory: positive for cough and dyspnea on exertion   PHYSICAL EXAMINATION:  General appearance: alert, cooperative, fatigued and no distress Head: Normocephalic, without obvious abnormality, atraumatic Neck: no adenopathy, no JVD, supple, symmetrical, trachea midline and thyroid not enlarged, symmetric, no tenderness/mass/nodules Lymph nodes: Cervical, supraclavicular, and axillary nodes normal. Resp: clear to auscultation bilaterally Back: symmetric, no  curvature. ROM normal. No CVA tenderness. Cardio: regular rate and rhythm, S1, S2 normal, no murmur, click, rub or gallop GI: soft, non-tender; bowel sounds normal; no masses,  no organomegaly Extremities: extremities normal, atraumatic, no cyanosis or edema  ECOG PERFORMANCE STATUS: 1 - Symptomatic but completely ambulatory  Blood pressure 120/72, pulse (!) 102, temperature 98.1 F (36.7 C), temperature source Temporal, resp. rate 18, height 5\' 2"  (1.575 m), weight 153 lb 8 oz (69.6 kg), SpO2 100 %.  LABORATORY DATA: Lab Results  Component Value Date   WBC 9.4 08/04/2019   HGB 10.6 (L) 08/04/2019   HCT 32.2 (L) 08/04/2019   MCV 101.3 (H) 08/04/2019   PLT 303 08/04/2019      Chemistry      Component Value Date/Time   NA 144 08/04/2019 1423   K 4.1 08/04/2019 1423   CL 108 08/04/2019 1423   CO2 25 08/04/2019 1423   BUN 15 08/04/2019 1423   CREATININE 0.97 08/04/2019 1423      Component Value Date/Time   CALCIUM 9.7 08/04/2019 1423   ALKPHOS 97 08/04/2019 1423   AST 13 (L) 08/04/2019 1423   ALT 9 08/04/2019 1423   BILITOT 0.3 08/04/2019 1423       RADIOGRAPHIC STUDIES: No results found.  ASSESSMENT AND PLAN: This is a very pleasant 64 years old white female with stage IIIA non-small cell lung cancer, presented with right upper lobe lung nodule and mediastinal lymphadenopathy diagnosed in January 2021.   She completed a course of concurrent chemoradiation with weekly carboplatin and paclitaxel for 5 cycles and tolerated her treatment well.  She has partial response. The patient is currently undergoing consolidation treatment with Imfinzi 1500 mg IV every 4 weeks status post 1 cycle. The patient tolerated the first cycle of her treatment well except for fatigue. I recommended for her to proceed with cycle #2 today as planned. For smoking cessation I strongly encouraged the patient to quit smoking. She will come back for follow-up visit in 4 weeks for evaluation before the  next cycle of her treatment. The patient was advised to call immediately if she has any concerning symptoms in the interval. The patient voices understanding of current disease status and treatment options and is in agreement with the current care plan.  All questions were answered. The patient knows to call the clinic with any problems, questions or concerns. We can certainly see the patient much sooner if necessary.   Disclaimer: This note was dictated with voice recognition software. Similar sounding words can inadvertently be transcribed and may not be corrected upon review.

## 2019-08-18 NOTE — Patient Instructions (Signed)
[facility] Discharge Instructions for Patients Receiving Chemotherapy  Today you received the following chemotherapy agents: Imfinzi.  To help prevent nausea and vomiting after your treatment, we encourage you to take your nausea medication as directed.   If you develop nausea and vomiting that is not controlled by your nausea medication, call the clinic.   BELOW ARE SYMPTOMS THAT SHOULD BE REPORTED IMMEDIATELY:  *FEVER GREATER THAN 100.5 F  *CHILLS WITH OR WITHOUT FEVER  NAUSEA AND VOMITING THAT IS NOT CONTROLLED WITH YOUR NAUSEA MEDICATION  *UNUSUAL SHORTNESS OF BREATH  *UNUSUAL BRUISING OR BLEEDING  TENDERNESS IN MOUTH AND THROAT WITH OR WITHOUT PRESENCE OF ULCERS  *URINARY PROBLEMS  *BOWEL PROBLEMS  UNUSUAL RASH Items with * indicate a potential emergency and should be followed up as soon as possible.  Feel free to call the clinic should you have any questions or concerns. The clinic phone number is [phone].  Please show the CHEMO ALERT CARD at check-in to the Emergency Department and triage nurse.   

## 2019-08-19 ENCOUNTER — Telehealth: Payer: Self-pay | Admitting: Internal Medicine

## 2019-08-19 NOTE — Telephone Encounter (Signed)
Scheduled appt per 5/20 los - pt to get an updated schedule next visit.

## 2019-09-14 NOTE — Progress Notes (Signed)
[facility] OFFICE PROGRESS NOTE  [name], MD [place] [address]  DIAGNOSIS:Stage IIIA (T1b, N2, M0) non-small cell lung cancer presented with right upper lobe lung nodule in addition to mediastinal lymphadenopathy diagnosed in January 2021.  PRIOR THERAPY: Concurrent chemoradiation with weekly carboplatin for AUC of 2 and paclitaxel 45 NG/M2. Status post5cycles. Last dose received on 05/30/19  CURRENT THERAPY: Consolidation immunotherapy with Imfinzi 1500 mg/kg IV every 4 weeks. First dose expected on 07/21/2019. Status post 2 cycles.   INTERVAL HISTORY: Betty Anderson 64 y.o. female returns to the clinic for a follow up visit accompanied by her husband. They are planning on visiting [place] next week to see their grandchildren. The patient is feeling well today without any concerning complaints. The patient continues to tolerate her treatment with immunotherapy with Imfinzi well without any concerning complaints. Denies any fever, chills, or night sweats. She lost a few pound since her appointment a few weeks ago. Denies any chest pain or hemoptysis. She notes a small spot between her breasts that comes and goes which reportedly started around the time of concurrent chemoradiation. The area is not palpable today. No abnormalities seen on her recent CT scan. The patient continues to smoke approximately 1.5 packs of cigarettes per day at least. She notes an associated cough and believes her shortness of breath may have worsened slightly. Denies sore throat or nasal congestion. Denies any nausea, vomiting, diarrhea, or constipation. Denies any headache or visual changes. Denies any rash. The patient is here today for evaluation before starting cycle #3.   MEDICAL HISTORY: Past Medical History:  Diagnosis Date  . COPD (chronic obstructive pulmonary disease) (HCC)    emphysema  . Hyperlipidemia   . Hypertension   . Lung nodule   . Paroxysmal atrial flutter  ([facility])    per cardiology note  . Peripheral artery disease ([facility])   . Tobacco abuse   . Vitamin D deficiency   . Wears glasses     ALLERGIES:  has No Known Allergies.  MEDICATIONS:  Current Outpatient Medications  Medication Sig Dispense Refill  . albuterol (PROVENTIL) (2.5 MG/3ML) 0.083% nebulizer solution Take 3 mLs (2.5 mg total) by nebulization every 6 (six) hours as needed for wheezing or shortness of breath. 75 mL 12  . aspirin EC 81 MG tablet Take 1 tablet (81 mg total) by mouth daily. 90 tablet 3  . Cholecalciferol (DIALYVITE VITAMIN D 5000) 125 MCG (5000 UT) capsule Take 5,000 Units by mouth daily.    Marland Kitchen escitalopram (LEXAPRO) 10 MG tablet Take 10 mg by mouth daily.    . rosuvastatin (CRESTOR) 20 MG tablet Take 20 mg by mouth every evening.     . sucralfate (CARAFATE) 1 g tablet Take 1 g by mouth 4 (four) times daily.    . Tiotropium Bromide-Olodaterol (STIOLTO RESPIMAT) 2.5-2.5 MCG/ACT AERS Inhale 2 puffs into the lungs daily. 4 g 0  . vitamin B-12 (CYANOCOBALAMIN) 1000 MCG tablet Take 1,000 mcg by mouth daily.     No current facility-administered medications for this visit.    SURGICAL HISTORY:  Past Surgical History:  Procedure Laterality Date  . BRONCHIAL NEEDLE ASPIRATION BIOPSY  04/12/2019   Procedure: BRONCHIAL NEEDLE ASPIRATION BIOPSIES;  Surgeon: Garner Nash, DO;  Location: [facility];  Service: Cardiopulmonary;;  . ENDOBRONCHIAL ULTRASOUND N/A 04/12/2019   Procedure: ENDOBRONCHIAL ULTRASOUND;  Surgeon: Garner Nash, DO;  Location: [facility];  Service: Cardiopulmonary;  Laterality: N/A;  . OVARIAN CYST REMOVAL    .  TONSILLECTOMY    . TUBAL LIGATION    . VIDEO BRONCHOSCOPY N/A 04/12/2019   Procedure: VIDEO BRONCHOSCOPY WITHOUT FLUORO;  Surgeon: Garner Nash, DO;  Location: [facility];  Service: Cardiopulmonary;  Laterality: N/A;    REVIEW OF SYSTEMS:   Review of Systems  Constitutional: Positive for weight loss. Negative for appetite change,  chills, fatigue, and fever. HENT: Negative for mouth sores, nosebleeds, sore throat and trouble swallowing.   Eyes: Negative for eye problems and icterus.  Respiratory: Positive for shortness of breath and cough.Negative for hemoptysis and wheezing.   Cardiovascular: Negative for chest pain and leg swelling.  Gastrointestinal: Negative for abdominal pain, constipation, diarrhea, nausea and vomiting.  Genitourinary: Negative for bladder incontinence, difficulty urinating, dysuria, frequency and hematuria.   Musculoskeletal: Negative for back pain, gait problem, neck pain and neck stiffness.  Skin: Negative for itching and rash.  Neurological: Negative for dizziness, extremity weakness, gait problem, headaches, light-headedness and seizures.  Hematological: Negative for adenopathy. Does not bruise/bleed easily.  Psychiatric/Behavioral: Negative for confusion, depression and sleep disturbance. The patient is not nervous/anxious.     PHYSICAL EXAMINATION:  Blood pressure 104/66, pulse 80, temperature 98.1 F (36.7 C), temperature source Temporal, resp. rate 18, height 5\' 2"  (1.575 m), weight 149 lb 8 oz (67.8 kg), SpO2 100 %.  ECOG PERFORMANCE STATUS: 1 - Symptomatic but completely ambulatory  Physical Exam  Constitutional: Oriented to person, place, and time and appears older than stated age and in no distress.  HENT:  Head: Normocephalic and atraumatic.  Mouth/Throat: Oropharynx is clear and moist. No oropharyngeal exudate.  Eyes: Conjunctivae are normal. Right eye exhibits no discharge. Left eye exhibits no discharge. No scleral icterus.  Neck: Normal range of motion. Neck supple.  Cardiovascular: There was not an appreciable palpable lesion in reported sternal region today. Normal rate, regular rhythm, normal heart sounds and intact distal pulses.   Pulmonary/Chest: Effort normal and breath sounds normal. No respiratory distress. No wheezes. No rales.  Abdominal: Soft. Bowel sounds are  normal. Exhibits no distension and no mass. There is no tenderness.  Musculoskeletal: Normal range of motion. Exhibits no edema.  Lymphadenopathy:    No cervical adenopathy.  Neurological: Alert and oriented to person, place, and time. Exhibits normal muscle tone. Gait normal. Coordination normal.  Skin: Skin is warm and dry. No rash noted. Not diaphoretic. No erythema. No pallor.  Psychiatric: Mood, memory and judgment normal.  Vitals reviewed.  LABORATORY DATA: Lab Results  Component Value Date   WBC 5.1 09/15/2019   HGB 10.9 (L) 09/15/2019   HCT 32.8 (L) 09/15/2019   MCV 97.0 09/15/2019   PLT 271 09/15/2019      Chemistry      Component Value Date/Time   NA 139 08/18/2019 0834   K 3.6 08/18/2019 0834   CL 106 08/18/2019 0834   CO2 23 08/18/2019 0834   BUN 17 08/18/2019 0834   CREATININE 1.19 (H) 08/18/2019 0834      Component Value Date/Time   CALCIUM 9.5 08/18/2019 0834   ALKPHOS 108 08/18/2019 0834   AST 11 (L) 08/18/2019 0834   ALT 7 08/18/2019 0834   BILITOT <0.2 (L) 08/18/2019 0834       RADIOGRAPHIC STUDIES:  No results found.   ASSESSMENT/PLAN:  This is a very pleasant 64 year old Caucasian female diagnosed with stage IIIa non-small cell lung cancer.She presented with a right upper lobe lung mass and mediastinal lymphadenopathy. She was diagnosed in January 2021.  She recently completed 5  cycles of weekly concurrent chemoradiation with carboplatin for an AUC of 2 and paclitaxel 45 mg/m. She is status post 5 cycles of treatment.   She is currently undergoing consolidation immunotherapy with Imfinzi 1500 mg IV every 4 weeks.  She is status post 2 cycles and she tolerated it well without any concerning adverse side effects.  Recommend that the patient continue on the same treatment at the same dose.  She will receive cycle #3 today as scheduled.   I will arrange for a restaging CT scan of the chest prior to her next cycle of treatment.   We will  see her back for a follow up visit in 4 weeks for evaluation and to review her scan before starting cycle #4.   The patient was strongly encouraged to quit smoking. She recently had formal smoking cessation counseling.She is not interested in quitting smoking at this time.   The patient was advised to call immediately if she has any concerning symptoms in the interval. The patient voices understanding of current disease status and treatment options and is in agreement with the current care plan. All questions were answered. The patient knows to call the clinic with any problems, questions or concerns. We can certainly see the patient much sooner if necessary    Orders Placed This Encounter  Procedures  . CT Chest W Contrast    Standing Status:   Future    Standing Expiration Date:   09/14/2020    Order Specific Question:   ** REASON FOR EXAM (FREE TEXT)    Answer:   Restaging Lung Cancer    Order Specific Question:   If indicated for the ordered procedure, I authorize the administration of contrast media per Radiology protocol    Answer:   Yes    Order Specific Question:   Preferred imaging location?    Answer:   [facility]    Order Specific Question:   Radiology Contrast Protocol - do NOT remove file path    Answer:   [email or web address]     [place], PA-C 09/15/19

## 2019-09-15 ENCOUNTER — Inpatient Hospital Stay: Payer: Medicare Other

## 2019-09-15 ENCOUNTER — Other Ambulatory Visit: Payer: Self-pay

## 2019-09-15 ENCOUNTER — Inpatient Hospital Stay: Payer: Medicare Other | Attending: Internal Medicine

## 2019-09-15 ENCOUNTER — Inpatient Hospital Stay: Payer: Medicare Other | Admitting: Physician Assistant

## 2019-09-15 VITALS — BP 104/66 | HR 80 | Temp 98.1°F | Resp 18 | Ht 62.0 in | Wt 149.5 lb

## 2019-09-15 DIAGNOSIS — C3411 Malignant neoplasm of upper lobe, right bronchus or lung: Secondary | ICD-10-CM

## 2019-09-15 DIAGNOSIS — Z5112 Encounter for antineoplastic immunotherapy: Secondary | ICD-10-CM | POA: Insufficient documentation

## 2019-09-15 DIAGNOSIS — Z79899 Other long term (current) drug therapy: Secondary | ICD-10-CM | POA: Insufficient documentation

## 2019-09-15 DIAGNOSIS — F17210 Nicotine dependence, cigarettes, uncomplicated: Secondary | ICD-10-CM | POA: Diagnosis not present

## 2019-09-15 DIAGNOSIS — R590 Localized enlarged lymph nodes: Secondary | ICD-10-CM | POA: Diagnosis not present

## 2019-09-15 DIAGNOSIS — F17200 Nicotine dependence, unspecified, uncomplicated: Secondary | ICD-10-CM

## 2019-09-15 LAB — CBC WITH DIFFERENTIAL (CANCER CENTER ONLY)
Abs Immature Granulocytes: 0.01 10*3/uL (ref 0.00–0.07)
Basophils Absolute: 0.1 10*3/uL (ref 0.0–0.1)
Basophils Relative: 1 %
Eosinophils Absolute: 0.1 10*3/uL (ref 0.0–0.5)
Eosinophils Relative: 3 %
HCT: 32.8 % — ABNORMAL LOW (ref 36.0–46.0)
Hemoglobin: 10.9 g/dL — ABNORMAL LOW (ref 12.0–15.0)
Immature Granulocytes: 0 %
Lymphocytes Relative: 14 %
Lymphs Abs: 0.7 10*3/uL (ref 0.7–4.0)
MCH: 32.2 pg (ref 26.0–34.0)
MCHC: 33.2 g/dL (ref 30.0–36.0)
MCV: 97 fL (ref 80.0–100.0)
Monocytes Absolute: 0.6 10*3/uL (ref 0.1–1.0)
Monocytes Relative: 12 %
Neutro Abs: 3.6 10*3/uL (ref 1.7–7.7)
Neutrophils Relative %: 70 %
Platelet Count: 271 10*3/uL (ref 150–400)
RBC: 3.38 MIL/uL — ABNORMAL LOW (ref 3.87–5.11)
RDW: 13.1 % (ref 11.5–15.5)
WBC Count: 5.1 10*3/uL (ref 4.0–10.5)
nRBC: 0 % (ref 0.0–0.2)

## 2019-09-15 LAB — CMP (CANCER CENTER ONLY)
ALT: 8 U/L (ref 0–44)
AST: 12 U/L — ABNORMAL LOW (ref 15–41)
Albumin: 3.4 g/dL — ABNORMAL LOW (ref 3.5–5.0)
Alkaline Phosphatase: 98 U/L (ref 38–126)
Anion gap: 10 (ref 5–15)
BUN: 17 mg/dL (ref 8–23)
CO2: 23 mmol/L (ref 22–32)
Calcium: 9.6 mg/dL (ref 8.9–10.3)
Chloride: 106 mmol/L (ref 98–111)
Creatinine: 1.13 mg/dL — ABNORMAL HIGH (ref 0.44–1.00)
GFR, Est AFR Am: 59 mL/min — ABNORMAL LOW (ref >60)
GFR, Estimated: 51 mL/min — ABNORMAL LOW (ref >60)
Glucose, Bld: 73 mg/dL (ref 70–99)
Potassium: 3.8 mmol/L (ref 3.5–5.1)
Sodium: 139 mmol/L (ref 135–145)
Total Bilirubin: <0.2 mg/dL — ABNORMAL LOW (ref 0.3–1.2)
Total Protein: 7.6 g/dL (ref 6.5–8.1)

## 2019-09-15 LAB — TSH: TSH: 4.086 u[IU]/mL — ABNORMAL HIGH (ref 0.308–3.960)

## 2019-09-15 MED ORDER — SODIUM CHLORIDE 0.9 % IV SOLN
1500.0000 mg | Freq: Once | INTRAVENOUS | Status: AC
  Administered 2019-09-15: 1500 mg via INTRAVENOUS
  Filled 2019-09-15: qty 30

## 2019-09-15 MED ORDER — SODIUM CHLORIDE 0.9 % IV SOLN
Freq: Once | INTRAVENOUS | Status: AC
  Filled 2019-09-15: qty 250

## 2019-09-15 NOTE — Patient Instructions (Signed)
[facility] Discharge Instructions for Patients Receiving Chemotherapy  Today you received the following chemotherapy agents: Imfinzi.  To help prevent nausea and vomiting after your treatment, we encourage you to take your nausea medication as directed.   If you develop nausea and vomiting that is not controlled by your nausea medication, call the clinic.   BELOW ARE SYMPTOMS THAT SHOULD BE REPORTED IMMEDIATELY:  *FEVER GREATER THAN 100.5 F  *CHILLS WITH OR WITHOUT FEVER  NAUSEA AND VOMITING THAT IS NOT CONTROLLED WITH YOUR NAUSEA MEDICATION  *UNUSUAL SHORTNESS OF BREATH  *UNUSUAL BRUISING OR BLEEDING  TENDERNESS IN MOUTH AND THROAT WITH OR WITHOUT PRESENCE OF ULCERS  *URINARY PROBLEMS  *BOWEL PROBLEMS  UNUSUAL RASH Items with * indicate a potential emergency and should be followed up as soon as possible.  Feel free to call the clinic should you have any questions or concerns. The clinic phone number is [phone].  Please show the CHEMO ALERT CARD at check-in to the Emergency Department and triage nurse.   

## 2019-10-11 ENCOUNTER — Ambulatory Visit (HOSPITAL_COMMUNITY)
Admission: RE | Admit: 2019-10-11 | Discharge: 2019-10-11 | Disposition: A | Discharge status: Home / Self Care | Payer: Medicare Other | Source: Ambulatory Visit | Attending: Physician Assistant | Admitting: Physician Assistant

## 2019-10-11 ENCOUNTER — Other Ambulatory Visit: Payer: Self-pay

## 2019-10-11 DIAGNOSIS — C3411 Malignant neoplasm of upper lobe, right bronchus or lung: Secondary | ICD-10-CM | POA: Insufficient documentation

## 2019-10-11 MED ORDER — IOHEXOL 300 MG/ML  SOLN
75.0000 mL | Freq: Once | INTRAMUSCULAR | Status: AC | PRN
  Administered 2019-10-11: 75 mL via INTRAVENOUS

## 2019-10-11 MED ORDER — SODIUM CHLORIDE (PF) 0.9 % IJ SOLN
INTRAMUSCULAR | Status: AC
  Filled 2019-10-11: qty 50

## 2019-10-13 ENCOUNTER — Other Ambulatory Visit: Payer: Self-pay

## 2019-10-13 ENCOUNTER — Inpatient Hospital Stay: Payer: Medicare Other

## 2019-10-13 ENCOUNTER — Inpatient Hospital Stay: Payer: Medicare Other | Attending: Internal Medicine | Admitting: Internal Medicine

## 2019-10-13 ENCOUNTER — Encounter: Payer: Self-pay | Admitting: Internal Medicine

## 2019-10-13 VITALS — BP 108/65 | HR 86 | Temp 97.3°F | Resp 18 | Ht 62.0 in | Wt 148.5 lb

## 2019-10-13 DIAGNOSIS — F17200 Nicotine dependence, unspecified, uncomplicated: Secondary | ICD-10-CM | POA: Diagnosis not present

## 2019-10-13 DIAGNOSIS — R05 Cough: Secondary | ICD-10-CM | POA: Diagnosis not present

## 2019-10-13 DIAGNOSIS — Z5112 Encounter for antineoplastic immunotherapy: Secondary | ICD-10-CM | POA: Diagnosis not present

## 2019-10-13 DIAGNOSIS — R188 Other ascites: Secondary | ICD-10-CM | POA: Diagnosis not present

## 2019-10-13 DIAGNOSIS — I700 Atherosclerosis of aorta: Secondary | ICD-10-CM | POA: Insufficient documentation

## 2019-10-13 DIAGNOSIS — R590 Localized enlarged lymph nodes: Secondary | ICD-10-CM

## 2019-10-13 DIAGNOSIS — I2510 Atherosclerotic heart disease of native coronary artery without angina pectoris: Secondary | ICD-10-CM | POA: Insufficient documentation

## 2019-10-13 DIAGNOSIS — R0602 Shortness of breath: Secondary | ICD-10-CM | POA: Diagnosis not present

## 2019-10-13 DIAGNOSIS — C3411 Malignant neoplasm of upper lobe, right bronchus or lung: Secondary | ICD-10-CM

## 2019-10-13 DIAGNOSIS — R5383 Other fatigue: Secondary | ICD-10-CM | POA: Insufficient documentation

## 2019-10-13 DIAGNOSIS — J449 Chronic obstructive pulmonary disease, unspecified: Secondary | ICD-10-CM | POA: Insufficient documentation

## 2019-10-13 DIAGNOSIS — Z79899 Other long term (current) drug therapy: Secondary | ICD-10-CM | POA: Diagnosis not present

## 2019-10-13 DIAGNOSIS — Z923 Personal history of irradiation: Secondary | ICD-10-CM | POA: Insufficient documentation

## 2019-10-13 LAB — CBC WITH DIFFERENTIAL (CANCER CENTER ONLY)
Abs Immature Granulocytes: 0.01 10*3/uL (ref 0.00–0.07)
Basophils Absolute: 0 10*3/uL (ref 0.0–0.1)
Basophils Relative: 1 %
Eosinophils Absolute: 0.1 10*3/uL (ref 0.0–0.5)
Eosinophils Relative: 3 %
HCT: 33.8 % — ABNORMAL LOW (ref 36.0–46.0)
Hemoglobin: 11 g/dL — ABNORMAL LOW (ref 12.0–15.0)
Immature Granulocytes: 0 %
Lymphocytes Relative: 12 %
Lymphs Abs: 0.7 10*3/uL (ref 0.7–4.0)
MCH: 30.7 pg (ref 26.0–34.0)
MCHC: 32.5 g/dL (ref 30.0–36.0)
MCV: 94.4 fL (ref 80.0–100.0)
Monocytes Absolute: 0.4 10*3/uL (ref 0.1–1.0)
Monocytes Relative: 7 %
Neutro Abs: 4.2 10*3/uL (ref 1.7–7.7)
Neutrophils Relative %: 77 %
Platelet Count: 287 10*3/uL (ref 150–400)
RBC: 3.58 MIL/uL — ABNORMAL LOW (ref 3.87–5.11)
RDW: 13.3 % (ref 11.5–15.5)
WBC Count: 5.4 10*3/uL (ref 4.0–10.5)
nRBC: 0 % (ref 0.0–0.2)

## 2019-10-13 LAB — CMP (CANCER CENTER ONLY)
ALT: 8 U/L (ref 0–44)
AST: 14 U/L — ABNORMAL LOW (ref 15–41)
Albumin: 3.3 g/dL — ABNORMAL LOW (ref 3.5–5.0)
Alkaline Phosphatase: 93 U/L (ref 38–126)
Anion gap: 10 (ref 5–15)
BUN: 15 mg/dL (ref 8–23)
CO2: 23 mmol/L (ref 22–32)
Calcium: 9.5 mg/dL (ref 8.9–10.3)
Chloride: 108 mmol/L (ref 98–111)
Creatinine: 1.11 mg/dL — ABNORMAL HIGH (ref 0.44–1.00)
GFR, Est AFR Am: >60 mL/min (ref >60)
GFR, Estimated: 52 mL/min — ABNORMAL LOW (ref >60)
Glucose, Bld: 79 mg/dL (ref 70–99)
Potassium: 3.9 mmol/L (ref 3.5–5.1)
Sodium: 141 mmol/L (ref 135–145)
Total Bilirubin: 0.3 mg/dL (ref 0.3–1.2)
Total Protein: 7.5 g/dL (ref 6.5–8.1)

## 2019-10-13 LAB — TSH: TSH: 4.019 u[IU]/mL — ABNORMAL HIGH (ref 0.308–3.960)

## 2019-10-13 MED ORDER — SODIUM CHLORIDE 0.9 % IV SOLN
1500.0000 mg | Freq: Once | INTRAVENOUS | Status: AC
  Administered 2019-10-13: 1500 mg via INTRAVENOUS
  Filled 2019-10-13: qty 30

## 2019-10-13 MED ORDER — SODIUM CHLORIDE 0.9 % IV SOLN
Freq: Once | INTRAVENOUS | Status: AC
  Filled 2019-10-13: qty 250

## 2019-10-13 NOTE — Patient Instructions (Signed)
[facility] Discharge Instructions for Patients Receiving Chemotherapy  Today you received the following immunotherapy agent: Durvalumab (Imfinzi)  To help prevent nausea and vomiting after your treatment, we encourage you to take your nausea medication as directed by your MD.   If you develop nausea and vomiting that is not controlled by your nausea medication, call the clinic.   BELOW ARE SYMPTOMS THAT SHOULD BE REPORTED IMMEDIATELY:  *FEVER GREATER THAN 100.5 F  *CHILLS WITH OR WITHOUT FEVER  NAUSEA AND VOMITING THAT IS NOT CONTROLLED WITH YOUR NAUSEA MEDICATION  *UNUSUAL SHORTNESS OF BREATH  *UNUSUAL BRUISING OR BLEEDING  TENDERNESS IN MOUTH AND THROAT WITH OR WITHOUT PRESENCE OF ULCERS  *URINARY PROBLEMS  *BOWEL PROBLEMS  UNUSUAL RASH Items with * indicate a potential emergency and should be followed up as soon as possible.  Feel free to call the clinic should you have any questions or concerns. The clinic phone number is [phone].  Please show the [facility] at check-in to the Emergency Department and triage nurse.

## 2019-10-13 NOTE — Progress Notes (Signed)
[facility] [phone]   Fax:([phone]) [place], MD [place] [address]  DIAGNOSIS: Stage IIIA (T1b, N2, M0) non-small cell lung cancer presented with right upper lobe lung nodule in addition to mediastinal lymphadenopathy diagnosed in January 2021.  PRIOR THERAPY: Concurrent chemoradiation with weekly carboplatin for AUC of 2 and paclitaxel 45 NG/M2. Status post5cycles. Last dose received on 05/30/19  CURRENT THERAPY: Consolidation immunotherapy with Imfinzi 1500 mg/kg IV every 4 weeks. First dose expected on 07/21/2019. Status post 3 cycles.   INTERVAL HISTORY: Betty Anderson 64 y.o. female returns to the clinic today for follow-up visit accompanied by her husband.  The patient is feeling fine today with no concerning complaints except for the persistent cough.  Unfortunately she continues to smoke at regular basis and not willing to quit.  She denied having any chest pain, shortness of breath except with exertion with no hemoptysis.  She denied having any recent weight loss or night sweats.  She has no nausea, vomiting, diarrhea or constipation.  She has no headache or visual changes.  The patient has no recent fever or chills.  She continues to tolerate her treatment with Imfinzi fairly well.  She is here today for evaluation after repeating CT scan of the chest for restaging of her disease.  MEDICAL HISTORY: Past Medical History:  Diagnosis Date  . COPD (chronic obstructive pulmonary disease) (HCC)    emphysema  . Hyperlipidemia   . Hypertension   . Lung nodule   . Paroxysmal atrial flutter ([facility])    per cardiology note  . Peripheral artery disease ([facility])   . Tobacco abuse   . Vitamin D deficiency   . Wears glasses     ALLERGIES:  has No Known Allergies.  MEDICATIONS:  Current Outpatient Medications  Medication Sig Dispense Refill  . albuterol (PROVENTIL) (2.5 MG/3ML) 0.083% nebulizer  solution Take 3 mLs (2.5 mg total) by nebulization every 6 (six) hours as needed for wheezing or shortness of breath. 75 mL 12  . aspirin EC 81 MG tablet Take 1 tablet (81 mg total) by mouth daily. 90 tablet 3  . Cholecalciferol (DIALYVITE VITAMIN D 5000) 125 MCG (5000 UT) capsule Take 5,000 Units by mouth daily.    Marland Kitchen escitalopram (LEXAPRO) 10 MG tablet Take 10 mg by mouth daily.    . rosuvastatin (CRESTOR) 20 MG tablet Take 20 mg by mouth every evening.     . sucralfate (CARAFATE) 1 g tablet Take 1 g by mouth 4 (four) times daily.    . Tiotropium Bromide-Olodaterol (STIOLTO RESPIMAT) 2.5-2.5 MCG/ACT AERS Inhale 2 puffs into the lungs daily. 4 g 0  . vitamin B-12 (CYANOCOBALAMIN) 1000 MCG tablet Take 1,000 mcg by mouth daily.     No current facility-administered medications for this visit.    SURGICAL HISTORY:  Past Surgical History:  Procedure Laterality Date  . BRONCHIAL NEEDLE ASPIRATION BIOPSY  04/12/2019   Procedure: BRONCHIAL NEEDLE ASPIRATION BIOPSIES;  Surgeon: Garner Nash, DO;  Location: [facility];  Service: Cardiopulmonary;;  . ENDOBRONCHIAL ULTRASOUND N/A 04/12/2019   Procedure: ENDOBRONCHIAL ULTRASOUND;  Surgeon: Garner Nash, DO;  Location: [facility];  Service: Cardiopulmonary;  Laterality: N/A;  . OVARIAN CYST REMOVAL    . TONSILLECTOMY    . TUBAL LIGATION    . VIDEO BRONCHOSCOPY N/A 04/12/2019   Procedure: VIDEO BRONCHOSCOPY WITHOUT FLUORO;  Surgeon: Garner Nash, DO;  Location: [facility];  Service:  Cardiopulmonary;  Laterality: N/A;    REVIEW OF SYSTEMS:  Constitutional: positive for fatigue Eyes: negative Ears, nose, mouth, throat, and face: negative Respiratory: positive for cough Cardiovascular: negative Gastrointestinal: negative Genitourinary:negative Integument/breast: negative Hematologic/lymphatic: negative Musculoskeletal:negative Neurological: negative Behavioral/Psych: negative Endocrine: negative Allergic/Immunologic: negative    PHYSICAL EXAMINATION: General appearance: alert, cooperative, fatigued and no distress Head: Normocephalic, without obvious abnormality, atraumatic Neck: no adenopathy, no JVD, supple, symmetrical, trachea midline and thyroid not enlarged, symmetric, no tenderness/mass/nodules Lymph nodes: Cervical, supraclavicular, and axillary nodes normal. Resp: clear to auscultation bilaterally Back: symmetric, no curvature. ROM normal. No CVA tenderness. Cardio: regular rate and rhythm, S1, S2 normal, no murmur, click, rub or gallop GI: soft, non-tender; bowel sounds normal; no masses,  no organomegaly Extremities: extremities normal, atraumatic, no cyanosis or edema Neurologic: Alert and oriented X 3, normal strength and tone. Normal symmetric reflexes. Normal coordination and gait  ECOG PERFORMANCE STATUS: 1 - Symptomatic but completely ambulatory  Blood pressure 108/65, pulse 86, temperature (!) 97.3 F (36.3 C), temperature source Temporal, resp. rate 18, height 5\' 2"  (1.575 m), weight 148 lb 8 oz (67.4 kg), SpO2 99 %.  LABORATORY DATA: Lab Results  Component Value Date   WBC 5.4 10/13/2019   HGB 11.0 (L) 10/13/2019   HCT 33.8 (L) 10/13/2019   MCV 94.4 10/13/2019   PLT 287 10/13/2019      Chemistry      Component Value Date/Time   NA 141 10/13/2019 1155   K 3.9 10/13/2019 1155   CL 108 10/13/2019 1155   CO2 23 10/13/2019 1155   BUN 15 10/13/2019 1155   CREATININE 1.11 (H) 10/13/2019 1155      Component Value Date/Time   CALCIUM 9.5 10/13/2019 1155   ALKPHOS 93 10/13/2019 1155   AST 14 (L) 10/13/2019 1155   ALT 8 10/13/2019 1155   BILITOT 0.3 10/13/2019 1155       RADIOGRAPHIC STUDIES: CT Chest W Contrast  Result Date: 10/11/2019 CLINICAL DATA:  Right-sided lung cancer, stage III. Diagnosed earlier this year. Status post radiation therapy and chemotherapy. Cough and shortness of breath. COPD. EXAM: CT CHEST WITH CONTRAST TECHNIQUE: Multidetector CT imaging of the chest was  performed during intravenous contrast administration. CONTRAST:  95mL OMNIPAQUE IOHEXOL 300 MG/ML  SOLN COMPARISON:  07/14/2019 FINDINGS: Cardiovascular: Advanced aortic and branch vessel atherosclerosis. Normal heart size, without pericardial effusion. Multivessel coronary artery atherosclerosis. No central pulmonary embolism, on this non-dedicated study. Mediastinum/Nodes: No supraclavicular adenopathy. Index right paratracheal node measures 9 mm on 49/2 versus 12 mm on the prior. Subcarinal node measures 7 mm on 64/2 versus 8 mm on the prior. No hilar adenopathy. Lungs/Pleura: No pleural fluid. Mild centrilobular and paraseptal emphysema. Right upper lobe pulmonary nodule measures 11 x 8 mm on 43/5 and is similar to on the prior exam. Right upper lobe calcified granulomas. 1-2 mm nodules along the right minor fissure are primarily similar, favored to represent subpleural lymph nodes. Right lower lobe subpleural pulmonary nodules, including at up to 3 mm, are similar. Upper Abdomen: Small volume perihepatic ascites, similar. Normal imaged portions of the liver, spleen, stomach, pancreas, gallbladder, adrenal glands, kidneys. Musculoskeletal: No acute osseous abnormality. Lucent lesion within the left side of the T5 vertebral body is favored to represent a hemangioma and is similar at approximately 5 mm. IMPRESSION: 1. Similar size of right upper lobe pulmonary nodule. 2. Response to therapy of mediastinal adenopathy 3. No new or progressive disease. 4. Similar small volume perihepatic ascites. 5. Aortic Atherosclerosis (  ICD10-I70.0) and Emphysema (ICD10-J43.9). 6. Age advanced coronary artery atherosclerosis. Recommend assessment of coronary risk factors and consideration of medical therapy. Electronically Signed   By: Abigail Miyamoto M.D.   On: 10/11/2019 14:49    ASSESSMENT AND PLAN: This is a very pleasant 64 years old white female with stage IIIA non-small cell lung cancer, presented with right upper lobe lung  nodule and mediastinal lymphadenopathy diagnosed in January 2021.   She completed a course of concurrent chemoradiation with weekly carboplatin and paclitaxel for 5 cycles and tolerated her treatment well.  She has partial response. The patient is currently undergoing consolidation treatment with Imfinzi 1500 mg IV every 4 weeks status post 3 cycles. The patient has been tolerating this treatment well with no concerning adverse effects. She had repeat CT scan of the chest performed recently.  I personally and independently reviewed the scans and discussed the results with the patient and her husband today. Her scan showed further improvement of the mediastinal lymphadenopathy and no evidence for disease progression. I recommended for her to proceed with cycle #4 today as planned. She will come back for follow-up visit in 4 weeks for evaluation before the next cycle of her treatment. I strongly encouraged the patient again to quit smoking but unfortunately she is not willing to quit. She was advised to call immediately if she has any concerning symptoms in the interval. The patient voices understanding of current disease status and treatment options and is in agreement with the current care plan.  All questions were answered. The patient knows to call the clinic with any problems, questions or concerns. We can certainly see the patient much sooner if necessary.   Disclaimer: This note was dictated with voice recognition software. Similar sounding words can inadvertently be transcribed and may not be corrected upon review.

## 2019-11-10 ENCOUNTER — Inpatient Hospital Stay: Payer: Medicare Other | Attending: Internal Medicine

## 2019-11-10 ENCOUNTER — Other Ambulatory Visit: Payer: Self-pay

## 2019-11-10 ENCOUNTER — Encounter: Payer: Self-pay | Admitting: Internal Medicine

## 2019-11-10 ENCOUNTER — Inpatient Hospital Stay: Payer: Medicare Other

## 2019-11-10 ENCOUNTER — Inpatient Hospital Stay: Payer: Medicare Other | Admitting: Internal Medicine

## 2019-11-10 VITALS — BP 95/62 | HR 77 | Temp 96.5°F | Resp 17 | Ht 62.0 in | Wt 149.8 lb

## 2019-11-10 DIAGNOSIS — C3411 Malignant neoplasm of upper lobe, right bronchus or lung: Secondary | ICD-10-CM | POA: Diagnosis not present

## 2019-11-10 DIAGNOSIS — Z5112 Encounter for antineoplastic immunotherapy: Secondary | ICD-10-CM | POA: Diagnosis not present

## 2019-11-10 DIAGNOSIS — R590 Localized enlarged lymph nodes: Secondary | ICD-10-CM | POA: Insufficient documentation

## 2019-11-10 DIAGNOSIS — I2510 Atherosclerotic heart disease of native coronary artery without angina pectoris: Secondary | ICD-10-CM | POA: Insufficient documentation

## 2019-11-10 DIAGNOSIS — J439 Emphysema, unspecified: Secondary | ICD-10-CM | POA: Diagnosis not present

## 2019-11-10 DIAGNOSIS — Z79899 Other long term (current) drug therapy: Secondary | ICD-10-CM | POA: Insufficient documentation

## 2019-11-10 DIAGNOSIS — R188 Other ascites: Secondary | ICD-10-CM | POA: Insufficient documentation

## 2019-11-10 DIAGNOSIS — Z923 Personal history of irradiation: Secondary | ICD-10-CM | POA: Insufficient documentation

## 2019-11-10 DIAGNOSIS — I700 Atherosclerosis of aorta: Secondary | ICD-10-CM | POA: Insufficient documentation

## 2019-11-10 LAB — CBC WITH DIFFERENTIAL (CANCER CENTER ONLY)
Abs Immature Granulocytes: 0.02 10*3/uL (ref 0.00–0.07)
Basophils Absolute: 0.1 10*3/uL (ref 0.0–0.1)
Basophils Relative: 1 %
Eosinophils Absolute: 0.1 10*3/uL (ref 0.0–0.5)
Eosinophils Relative: 3 %
HCT: 34.9 % — ABNORMAL LOW (ref 36.0–46.0)
Hemoglobin: 11.3 g/dL — ABNORMAL LOW (ref 12.0–15.0)
Immature Granulocytes: 0 %
Lymphocytes Relative: 13 %
Lymphs Abs: 0.6 10*3/uL — ABNORMAL LOW (ref 0.7–4.0)
MCH: 30.7 pg (ref 26.0–34.0)
MCHC: 32.4 g/dL (ref 30.0–36.0)
MCV: 94.8 fL (ref 80.0–100.0)
Monocytes Absolute: 0.4 10*3/uL (ref 0.1–1.0)
Monocytes Relative: 9 %
Neutro Abs: 3.8 10*3/uL (ref 1.7–7.7)
Neutrophils Relative %: 74 %
Platelet Count: 275 10*3/uL (ref 150–400)
RBC: 3.68 MIL/uL — ABNORMAL LOW (ref 3.87–5.11)
RDW: 14.1 % (ref 11.5–15.5)
WBC Count: 5.1 10*3/uL (ref 4.0–10.5)
nRBC: 0 % (ref 0.0–0.2)

## 2019-11-10 LAB — CMP (CANCER CENTER ONLY)
ALT: 10 U/L (ref 0–44)
AST: 13 U/L — ABNORMAL LOW (ref 15–41)
Albumin: 3.5 g/dL (ref 3.5–5.0)
Alkaline Phosphatase: 101 U/L (ref 38–126)
Anion gap: 10 (ref 5–15)
BUN: 19 mg/dL (ref 8–23)
CO2: 23 mmol/L (ref 22–32)
Calcium: 10.3 mg/dL (ref 8.9–10.3)
Chloride: 106 mmol/L (ref 98–111)
Creatinine: 1.15 mg/dL — ABNORMAL HIGH (ref 0.44–1.00)
GFR, Est AFR Am: 58 mL/min — ABNORMAL LOW (ref >60)
GFR, Estimated: 50 mL/min — ABNORMAL LOW (ref >60)
Glucose, Bld: 72 mg/dL (ref 70–99)
Potassium: 3.7 mmol/L (ref 3.5–5.1)
Sodium: 139 mmol/L (ref 135–145)
Total Bilirubin: 0.3 mg/dL (ref 0.3–1.2)
Total Protein: 7.9 g/dL (ref 6.5–8.1)

## 2019-11-10 LAB — TSH: TSH: 2.917 u[IU]/mL (ref 0.308–3.960)

## 2019-11-10 MED ORDER — SODIUM CHLORIDE 0.9 % IV SOLN
1500.0000 mg | Freq: Once | INTRAVENOUS | Status: AC
  Administered 2019-11-10: 1500 mg via INTRAVENOUS
  Filled 2019-11-10: qty 30

## 2019-11-10 MED ORDER — SODIUM CHLORIDE 0.9 % IV SOLN
Freq: Once | INTRAVENOUS | Status: AC
  Filled 2019-11-10: qty 250

## 2019-11-10 NOTE — Progress Notes (Signed)
[facility] [phone]   Fax:(336) [place], MD [place] [address]  DIAGNOSIS: Stage IIIA (T1b, N2, M0) non-small cell lung cancer presented with right upper lobe lung nodule in addition to mediastinal lymphadenopathy diagnosed in January 2021.  PRIOR THERAPY: Concurrent chemoradiation with weekly carboplatin for AUC of 2 and paclitaxel 45 NG/M2. Status post5cycles. Last dose received on 05/30/19  CURRENT THERAPY: Consolidation immunotherapy with Imfinzi 1500 mg/kg IV every 4 weeks. First dose expected on 07/21/2019. Status post 4 cycles.   INTERVAL HISTORY: Betty Anderson 64 y.o. female returns to the clinic today for follow-up visit accompanied by her husband.  The patient is feeling fine today with no concerning complaints except for fatigue.  She just came back from a trip to [place] where she visited many grandkids and her sister-in-law.  The patient did not receive her Covid vaccine and is not interested in receiving it.  She denied having any current chest pain, shortness of breath except with exertion with no cough or hemoptysis.  She denied having any fever or chills.  She has no nausea, vomiting, diarrhea or constipation.  She is here today for evaluation before starting cycle #5 of her treatment.   MEDICAL HISTORY: Past Medical History:  Diagnosis Date   COPD (chronic obstructive pulmonary disease) (HCC)    emphysema   Hyperlipidemia    Hypertension    Lung nodule    Paroxysmal atrial flutter ([facility])    per cardiology note   Peripheral artery disease ([facility])    Tobacco abuse    Vitamin D deficiency    Wears glasses     ALLERGIES:  has No Known Allergies.  MEDICATIONS:  Current Outpatient Medications  Medication Sig Dispense Refill   albuterol (PROVENTIL) (2.5 MG/3ML) 0.083% nebulizer solution Take 3 mLs (2.5 mg total) by nebulization every 6 (six) hours as needed for  wheezing or shortness of breath. 75 mL 12   aspirin EC 81 MG tablet Take 1 tablet (81 mg total) by mouth daily. 90 tablet 3   Cholecalciferol (DIALYVITE VITAMIN D 5000) 125 MCG (5000 UT) capsule Take 5,000 Units by mouth daily.     escitalopram (LEXAPRO) 10 MG tablet Take 10 mg by mouth daily.     rosuvastatin (CRESTOR) 20 MG tablet Take 20 mg by mouth every evening.      sucralfate (CARAFATE) 1 g tablet Take 1 g by mouth 4 (four) times daily.     Tiotropium Bromide-Olodaterol (STIOLTO RESPIMAT) 2.5-2.5 MCG/ACT AERS Inhale 2 puffs into the lungs daily. 4 g 0   vitamin B-12 (CYANOCOBALAMIN) 1000 MCG tablet Take 1,000 mcg by mouth daily.     No current facility-administered medications for this visit.    SURGICAL HISTORY:  Past Surgical History:  Procedure Laterality Date   BRONCHIAL NEEDLE ASPIRATION BIOPSY  04/12/2019   Procedure: BRONCHIAL NEEDLE ASPIRATION BIOPSIES;  Surgeon: Garner Nash, DO;  Location: [facility];  Service: Cardiopulmonary;;   ENDOBRONCHIAL ULTRASOUND N/A 04/12/2019   Procedure: ENDOBRONCHIAL ULTRASOUND;  Surgeon: Garner Nash, DO;  Location: [facility];  Service: Cardiopulmonary;  Laterality: N/A;   OVARIAN CYST REMOVAL     TONSILLECTOMY     TUBAL LIGATION     VIDEO BRONCHOSCOPY N/A 04/12/2019   Procedure: VIDEO BRONCHOSCOPY WITHOUT FLUORO;  Surgeon: Garner Nash, DO;  Location: [facility];  Service: Cardiopulmonary;  Laterality: N/A;    REVIEW OF SYSTEMS:  A comprehensive review  of systems was negative except for: Constitutional: positive for fatigue   PHYSICAL EXAMINATION: General appearance: alert, cooperative, fatigued and no distress Head: Normocephalic, without obvious abnormality, atraumatic Neck: no adenopathy, no JVD, supple, symmetrical, trachea midline and thyroid not enlarged, symmetric, no tenderness/mass/nodules Lymph nodes: Cervical, supraclavicular, and axillary nodes normal. Resp: clear to auscultation  bilaterally Back: symmetric, no curvature. ROM normal. No CVA tenderness. Cardio: regular rate and rhythm, S1, S2 normal, no murmur, click, rub or gallop GI: soft, non-tender; bowel sounds normal; no masses,  no organomegaly Extremities: extremities normal, atraumatic, no cyanosis or edema  ECOG PERFORMANCE STATUS: 1 - Symptomatic but completely ambulatory  Blood pressure 95/62, pulse 77, temperature (!) 96.5 F (35.8 C), temperature source Tympanic, resp. rate 17, height 5\' 2"  (1.575 m), weight 149 lb 12.8 oz (67.9 kg), SpO2 97 %.  LABORATORY DATA: Lab Results  Component Value Date   WBC 5.1 11/10/2019   HGB 11.3 (L) 11/10/2019   HCT 34.9 (L) 11/10/2019   MCV 94.8 11/10/2019   PLT 275 11/10/2019      Chemistry      Component Value Date/Time   NA 141 10/13/2019 1155   K 3.9 10/13/2019 1155   CL 108 10/13/2019 1155   CO2 23 10/13/2019 1155   BUN 15 10/13/2019 1155   CREATININE 1.11 (H) 10/13/2019 1155      Component Value Date/Time   CALCIUM 9.5 10/13/2019 1155   ALKPHOS 93 10/13/2019 1155   AST 14 (L) 10/13/2019 1155   ALT 8 10/13/2019 1155   BILITOT 0.3 10/13/2019 1155       RADIOGRAPHIC STUDIES: CT Chest W Contrast  Result Date: 10/11/2019 CLINICAL DATA:  Right-sided lung cancer, stage III. Diagnosed earlier this year. Status post radiation therapy and chemotherapy. Cough and shortness of breath. COPD. EXAM: CT CHEST WITH CONTRAST TECHNIQUE: Multidetector CT imaging of the chest was performed during intravenous contrast administration. CONTRAST:  64mL OMNIPAQUE IOHEXOL 300 MG/ML  SOLN COMPARISON:  07/14/2019 FINDINGS: Cardiovascular: Advanced aortic and branch vessel atherosclerosis. Normal heart size, without pericardial effusion. Multivessel coronary artery atherosclerosis. No central pulmonary embolism, on this non-dedicated study. Mediastinum/Nodes: No supraclavicular adenopathy. Index right paratracheal node measures 9 mm on 49/2 versus 12 mm on the prior.  Subcarinal node measures 7 mm on 64/2 versus 8 mm on the prior. No hilar adenopathy. Lungs/Pleura: No pleural fluid. Mild centrilobular and paraseptal emphysema. Right upper lobe pulmonary nodule measures 11 x 8 mm on 43/5 and is similar to on the prior exam. Right upper lobe calcified granulomas. 1-2 mm nodules along the right minor fissure are primarily similar, favored to represent subpleural lymph nodes. Right lower lobe subpleural pulmonary nodules, including at up to 3 mm, are similar. Upper Abdomen: Small volume perihepatic ascites, similar. Normal imaged portions of the liver, spleen, stomach, pancreas, gallbladder, adrenal glands, kidneys. Musculoskeletal: No acute osseous abnormality. Lucent lesion within the left side of the T5 vertebral body is favored to represent a hemangioma and is similar at approximately 5 mm. IMPRESSION: 1. Similar size of right upper lobe pulmonary nodule. 2. Response to therapy of mediastinal adenopathy 3. No new or progressive disease. 4. Similar small volume perihepatic ascites. 5. Aortic Atherosclerosis (ICD10-I70.0) and Emphysema (ICD10-J43.9). 6. Age advanced coronary artery atherosclerosis. Recommend assessment of coronary risk factors and consideration of medical therapy. Electronically Signed   By: Abigail Miyamoto M.D.   On: 10/11/2019 14:49    ASSESSMENT AND PLAN: This is a very pleasant 64 years old white female with stage  IIIA non-small cell lung cancer, presented with right upper lobe lung nodule and mediastinal lymphadenopathy diagnosed in January 2021.   She completed a course of concurrent chemoradiation with weekly carboplatin and paclitaxel for 5 cycles and tolerated her treatment well.  She has partial response. The patient is currently undergoing consolidation treatment with Imfinzi 1500 mg IV every 4 weeks status post 4 cycles. The patient continues to tolerate her treatment with Imfinzi fairly well with no concerning adverse effects. I recommended for her  to proceed with cycle #5 today as planned. For the smoking cessation, I strongly encouraged the patient to quit smoking. I also encouraged the patient to consider taking the Covid vaccine but she is very reluctant. She will come back for follow-up visit in 4 weeks for evaluation before the next cycle of her treatment. She was advised to call immediately if she has any concerning symptoms in the interval. The patient voices understanding of current disease status and treatment options and is in agreement with the current care plan.  All questions were answered. The patient knows to call the clinic with any problems, questions or concerns. We can certainly see the patient much sooner if necessary.   Disclaimer: This note was dictated with voice recognition software. Similar sounding words can inadvertently be transcribed and may not be corrected upon review.

## 2019-11-10 NOTE — Patient Instructions (Signed)
[facility] Discharge Instructions for Patients Receiving Chemotherapy  Today you received the following chemotherapy agents: Imfinzi.  To help prevent nausea and vomiting after your treatment, we encourage you to take your nausea medication as directed.   If you develop nausea and vomiting that is not controlled by your nausea medication, call the clinic.   BELOW ARE SYMPTOMS THAT SHOULD BE REPORTED IMMEDIATELY:  *FEVER GREATER THAN 100.5 F  *CHILLS WITH OR WITHOUT FEVER  NAUSEA AND VOMITING THAT IS NOT CONTROLLED WITH YOUR NAUSEA MEDICATION  *UNUSUAL SHORTNESS OF BREATH  *UNUSUAL BRUISING OR BLEEDING  TENDERNESS IN MOUTH AND THROAT WITH OR WITHOUT PRESENCE OF ULCERS  *URINARY PROBLEMS  *BOWEL PROBLEMS  UNUSUAL RASH Items with * indicate a potential emergency and should be followed up as soon as possible.  Feel free to call the clinic should you have any questions or concerns. The clinic phone number is [phone].  Please show the CHEMO ALERT CARD at check-in to the Emergency Department and triage nurse.   

## 2019-12-07 ENCOUNTER — Inpatient Hospital Stay: Payer: Medicare Other

## 2019-12-07 ENCOUNTER — Inpatient Hospital Stay: Payer: Medicare Other | Attending: Internal Medicine | Admitting: Internal Medicine

## 2019-12-07 ENCOUNTER — Encounter: Payer: Self-pay | Admitting: Internal Medicine

## 2019-12-07 ENCOUNTER — Other Ambulatory Visit: Payer: Self-pay

## 2019-12-07 VITALS — BP 90/64 | HR 89 | Temp 97.8°F | Resp 20 | Ht 62.0 in | Wt 150.2 lb

## 2019-12-07 DIAGNOSIS — F17200 Nicotine dependence, unspecified, uncomplicated: Secondary | ICD-10-CM | POA: Diagnosis not present

## 2019-12-07 DIAGNOSIS — R05 Cough: Secondary | ICD-10-CM | POA: Diagnosis not present

## 2019-12-07 DIAGNOSIS — R590 Localized enlarged lymph nodes: Secondary | ICD-10-CM | POA: Diagnosis not present

## 2019-12-07 DIAGNOSIS — R5383 Other fatigue: Secondary | ICD-10-CM | POA: Diagnosis not present

## 2019-12-07 DIAGNOSIS — R0609 Other forms of dyspnea: Secondary | ICD-10-CM | POA: Diagnosis not present

## 2019-12-07 DIAGNOSIS — C3490 Malignant neoplasm of unspecified part of unspecified bronchus or lung: Secondary | ICD-10-CM | POA: Diagnosis not present

## 2019-12-07 DIAGNOSIS — C3411 Malignant neoplasm of upper lobe, right bronchus or lung: Secondary | ICD-10-CM | POA: Insufficient documentation

## 2019-12-07 DIAGNOSIS — Z79899 Other long term (current) drug therapy: Secondary | ICD-10-CM | POA: Insufficient documentation

## 2019-12-07 DIAGNOSIS — Z923 Personal history of irradiation: Secondary | ICD-10-CM | POA: Diagnosis not present

## 2019-12-07 DIAGNOSIS — Z5112 Encounter for antineoplastic immunotherapy: Secondary | ICD-10-CM | POA: Diagnosis not present

## 2019-12-07 LAB — CMP (CANCER CENTER ONLY)
ALT: 9 U/L (ref 0–44)
AST: 13 U/L — ABNORMAL LOW (ref 15–41)
Albumin: 3.4 g/dL — ABNORMAL LOW (ref 3.5–5.0)
Alkaline Phosphatase: 98 U/L (ref 38–126)
Anion gap: 8 (ref 5–15)
BUN: 21 mg/dL (ref 8–23)
CO2: 22 mmol/L (ref 22–32)
Calcium: 9.2 mg/dL (ref 8.9–10.3)
Chloride: 106 mmol/L (ref 98–111)
Creatinine: 1.37 mg/dL — ABNORMAL HIGH (ref 0.44–1.00)
GFR, Est AFR Am: 47 mL/min — ABNORMAL LOW (ref >60)
GFR, Estimated: 40 mL/min — ABNORMAL LOW (ref >60)
Glucose, Bld: 89 mg/dL (ref 70–99)
Potassium: 3.9 mmol/L (ref 3.5–5.1)
Sodium: 136 mmol/L (ref 135–145)
Total Bilirubin: 0.2 mg/dL — ABNORMAL LOW (ref 0.3–1.2)
Total Protein: 7.7 g/dL (ref 6.5–8.1)

## 2019-12-07 LAB — CBC WITH DIFFERENTIAL (CANCER CENTER ONLY)
Abs Immature Granulocytes: 0.01 10*3/uL (ref 0.00–0.07)
Basophils Absolute: 0.1 10*3/uL (ref 0.0–0.1)
Basophils Relative: 1 %
Eosinophils Absolute: 0.1 10*3/uL (ref 0.0–0.5)
Eosinophils Relative: 2 %
HCT: 33.5 % — ABNORMAL LOW (ref 36.0–46.0)
Hemoglobin: 11.2 g/dL — ABNORMAL LOW (ref 12.0–15.0)
Immature Granulocytes: 0 %
Lymphocytes Relative: 14 %
Lymphs Abs: 0.8 10*3/uL (ref 0.7–4.0)
MCH: 31.4 pg (ref 26.0–34.0)
MCHC: 33.4 g/dL (ref 30.0–36.0)
MCV: 93.8 fL (ref 80.0–100.0)
Monocytes Absolute: 0.5 10*3/uL (ref 0.1–1.0)
Monocytes Relative: 8 %
Neutro Abs: 4.5 10*3/uL (ref 1.7–7.7)
Neutrophils Relative %: 75 %
Platelet Count: 260 10*3/uL (ref 150–400)
RBC: 3.57 MIL/uL — ABNORMAL LOW (ref 3.87–5.11)
RDW: 14 % (ref 11.5–15.5)
WBC Count: 6 10*3/uL (ref 4.0–10.5)
nRBC: 0 % (ref 0.0–0.2)

## 2019-12-07 LAB — TSH: TSH: 2.249 u[IU]/mL (ref 0.308–3.960)

## 2019-12-07 MED ORDER — SODIUM CHLORIDE 0.9 % IV SOLN
1500.0000 mg | Freq: Once | INTRAVENOUS | Status: AC
  Administered 2019-12-07: 1500 mg via INTRAVENOUS
  Filled 2019-12-07: qty 30

## 2019-12-07 MED ORDER — SODIUM CHLORIDE 0.9 % IV SOLN
Freq: Once | INTRAVENOUS | Status: AC
  Filled 2019-12-07: qty 250

## 2019-12-07 NOTE — Patient Instructions (Signed)
Steps to Quit Smoking Smoking tobacco is the leading cause of preventable death. It can affect almost every organ in the body. Smoking puts you and people around you at risk for many serious, long-lasting (chronic) diseases. Quitting smoking can be hard, but it is one of the best things that you can do for your health. It is never too late to quit. How do I get ready to quit? When you decide to quit smoking, make a plan to help you succeed. Before you quit:  Pick a date to quit. Set a date within the next 2 weeks to give you time to prepare.  Write down the reasons why you are quitting. Keep this list in places where you will see it often.  Tell your family, friends, and co-workers that you are quitting. Their support is important.  Talk with your doctor about the choices that may help you quit.  Find out if your health insurance will pay for these treatments.  Know the people, places, things, and activities that make you want to smoke (triggers). Avoid them. What first steps can I take to quit smoking?  Throw away all cigarettes at home, at work, and in your car.  Throw away the things that you use when you smoke, such as ashtrays and lighters.  Clean your car. Make sure to empty the ashtray.  Clean your home, including curtains and carpets. What can I do to help me quit smoking? Talk with your doctor about taking medicines and seeing a counselor at the same time. You are more likely to succeed when you do both.  If you are pregnant or breastfeeding, talk with your doctor about counseling or other ways to quit smoking. Do not take medicine to help you quit smoking unless your doctor tells you to do so. To quit smoking: Quit right away  Quit smoking totally, instead of slowly cutting back on how much you smoke over a period of time.  Go to counseling. You are more likely to quit if you go to counseling sessions regularly. Take medicine You may take medicines to help you quit. Some  medicines need a prescription, and some you can buy over-the-counter. Some medicines may contain a drug called nicotine to replace the nicotine in cigarettes. Medicines may:  Help you to stop having the desire to smoke (cravings).  Help to stop the problems that come when you stop smoking (withdrawal symptoms). Your doctor may ask you to use:  Nicotine patches, gum, or lozenges.  Nicotine inhalers or sprays.  Non-nicotine medicine that is taken by mouth. Find resources Find resources and other ways to help you quit smoking and remain smoke-free after you quit. These resources are most helpful when you use them often. They include:  Online chats with a counselor.  Phone quitlines.  Printed self-help materials.  Support groups or group counseling.  Text messaging programs.  Mobile phone apps. Use apps on your mobile phone or tablet that can help you stick to your quit plan. There are many free apps for mobile phones and tablets as well as websites. Examples include Quit Guide from the CDC and smokefree.gov  What things can I do to make it easier to quit?   Talk to your family and friends. Ask them to support and encourage you.  Call a phone quitline (1-800-QUIT-NOW), reach out to support groups, or work with a counselor.  Ask people who smoke to not smoke around you.  Avoid places that make you want to smoke,   such as: ? Bars. ? Parties. ? Smoke-break areas at work.  Spend time with people who do not smoke.  Lower the stress in your life. Stress can make you want to smoke. Try these things to help your stress: ? Getting regular exercise. ? Doing deep-breathing exercises. ? Doing yoga. ? Meditating. ? Doing a body scan. To do this, close your eyes, focus on one area of your body at a time from head to toe. Notice which parts of your body are tense. Try to relax the muscles in those areas. How will I feel when I quit smoking? Day 1 to 3 weeks Within the first 24 hours,  you may start to have some problems that come from quitting tobacco. These problems are very bad 2-3 days after you quit, but they do not often last for more than 2-3 weeks. You may get these symptoms:  Mood swings.  Feeling restless, nervous, angry, or annoyed.  Trouble concentrating.  Dizziness.  Strong desire for high-sugar foods and nicotine.  Weight gain.  Trouble pooping (constipation).  Feeling like you may vomit (nausea).  Coughing or a sore throat.  Changes in how the medicines that you take for other issues work in your body.  Depression.  Trouble sleeping (insomnia). Week 3 and afterward After the first 2-3 weeks of quitting, you may start to notice more positive results, such as:  Better sense of smell and taste.  Less coughing and sore throat.  Slower heart rate.  Lower blood pressure.  Clearer skin.  Better breathing.  Fewer sick days. Quitting smoking can be hard. Do not give up if you fail the first time. Some people need to try a few times before they succeed. Do your best to stick to your quit plan, and talk with your doctor if you have any questions or concerns. Summary  Smoking tobacco is the leading cause of preventable death. Quitting smoking can be hard, but it is one of the best things that you can do for your health.  When you decide to quit smoking, make a plan to help you succeed.  Quit smoking right away, not slowly over a period of time.  When you start quitting, seek help from your doctor, family, or friends. This information is not intended to replace advice given to you by your health care provider. Make sure you discuss any questions you have with your health care provider. Document Revised: 12/10/2018 Document Reviewed: 06/05/2018 Elsevier Patient Education  2020 Elsevier Inc.  

## 2019-12-07 NOTE — Patient Instructions (Signed)
[facility] Discharge Instructions for Patients Receiving Chemotherapy  Today you received the following chemotherapy agents: Imfinzi  To help prevent nausea and vomiting after your treatment, we encourage you to take your nausea medication  as prescribed.  If you develop nausea and vomiting that is not controlled by your nausea medication, call the clinic.   BELOW ARE SYMPTOMS THAT SHOULD BE REPORTED IMMEDIATELY:  *FEVER GREATER THAN 100.5 F  *CHILLS WITH OR WITHOUT FEVER  NAUSEA AND VOMITING THAT IS NOT CONTROLLED WITH YOUR NAUSEA MEDICATION  *UNUSUAL SHORTNESS OF BREATH  *UNUSUAL BRUISING OR BLEEDING  TENDERNESS IN MOUTH AND THROAT WITH OR WITHOUT PRESENCE OF ULCERS  *URINARY PROBLEMS  *BOWEL PROBLEMS  UNUSUAL RASH Items with * indicate a potential emergency and should be followed up as soon as possible.  Feel free to call the clinic should you have any questions or concerns. The clinic phone number is [phone].  Please show the [facility] at check-in to the Emergency Department and triage nurse.

## 2019-12-07 NOTE — Progress Notes (Signed)
[facility] [phone]   Fax:(336) [place], MD [place] [address]  DIAGNOSIS: Stage IIIA (T1b, N2, M0) non-small cell lung cancer presented with right upper lobe lung nodule in addition to mediastinal lymphadenopathy diagnosed in January 2021.  PRIOR THERAPY: Concurrent chemoradiation with weekly carboplatin for AUC of 2 and paclitaxel 45 NG/M2. Status post5cycles. Last dose received on 05/30/19  CURRENT THERAPY: Consolidation immunotherapy with Imfinzi 1500 mg/kg IV every 4 weeks. First dose expected on 07/21/2019. Status post 5 cycles.   INTERVAL HISTORY: Betty Anderson 64 y.o. female returns to the clinic today for follow-up visit accompanied by her husband.  The patient is feeling fine today with no concerning complaints except for the persistent cough.  She denied having any chest pain but has shortness of breath with exertion with no hemoptysis.  She denied having any fever or chills.  She has no nausea, vomiting, diarrhea or constipation.  She denied having any headache or visual changes.  She continues to tolerate her treatment with consolidation Imfinzi fairly well.  She is here today for evaluation before starting cycle #6 of her treatment.  MEDICAL HISTORY: Past Medical History:  Diagnosis Date  . COPD (chronic obstructive pulmonary disease) (HCC)    emphysema  . Hyperlipidemia   . Hypertension   . Lung nodule   . Paroxysmal atrial flutter ([facility])    per cardiology note  . Peripheral artery disease ([place])   . Tobacco abuse   . Vitamin D deficiency   . Wears glasses     ALLERGIES:  has No Known Allergies.  MEDICATIONS:  Current Outpatient Medications  Medication Sig Dispense Refill  . albuterol (PROVENTIL) (2.5 MG/3ML) 0.083% nebulizer solution Take 3 mLs (2.5 mg total) by nebulization every 6 (six) hours as needed for wheezing or shortness of breath. 75 mL 12  . aspirin EC  81 MG tablet Take 1 tablet (81 mg total) by mouth daily. 90 tablet 3  . Cholecalciferol (DIALYVITE VITAMIN D 5000) 125 MCG (5000 UT) capsule Take 5,000 Units by mouth daily.    [place] Kitchen escitalopram (LEXAPRO) 10 MG tablet Take 10 mg by mouth daily.    . rosuvastatin (CRESTOR) 20 MG tablet Take 20 mg by mouth every evening.     . sucralfate (CARAFATE) 1 g tablet Take 1 g by mouth 4 (four) times daily.    . Tiotropium Bromide-Olodaterol (STIOLTO RESPIMAT) 2.5-2.5 MCG/ACT AERS Inhale 2 puffs into the lungs daily. 4 g 0  . vitamin B-12 (CYANOCOBALAMIN) 1000 MCG tablet Take 1,000 mcg by mouth daily.     No current facility-administered medications for this visit.    SURGICAL HISTORY:  Past Surgical History:  Procedure Laterality Date  . BRONCHIAL NEEDLE ASPIRATION BIOPSY  04/12/2019   Procedure: BRONCHIAL NEEDLE ASPIRATION BIOPSIES;  Surgeon: Garner Nash, DO;  Location: [facility];  Service: Cardiopulmonary;;  . ENDOBRONCHIAL ULTRASOUND N/A 04/12/2019   Procedure: ENDOBRONCHIAL ULTRASOUND;  Surgeon: Garner Nash, DO;  Location: [facility];  Service: Cardiopulmonary;  Laterality: N/A;  . OVARIAN CYST REMOVAL    . TONSILLECTOMY    . TUBAL LIGATION    . VIDEO BRONCHOSCOPY N/A 04/12/2019   Procedure: VIDEO BRONCHOSCOPY WITHOUT FLUORO;  Surgeon: Garner Nash, DO;  Location: [facility];  Service: Cardiopulmonary;  Laterality: N/A;    REVIEW OF SYSTEMS:  A comprehensive review of systems was negative except for: Constitutional: positive for fatigue Respiratory: positive for cough  and dyspnea on exertion   PHYSICAL EXAMINATION: General appearance: alert, cooperative, fatigued and no distress Head: Normocephalic, without obvious abnormality, atraumatic Neck: no adenopathy, no JVD, supple, symmetrical, trachea midline and thyroid not enlarged, symmetric, no tenderness/mass/nodules Lymph nodes: Cervical, supraclavicular, and axillary nodes normal. Resp: clear to auscultation  bilaterally Back: symmetric, no curvature. ROM normal. No CVA tenderness. Cardio: regular rate and rhythm, S1, S2 normal, no murmur, click, rub or gallop GI: soft, non-tender; bowel sounds normal; no masses,  no organomegaly Extremities: extremities normal, atraumatic, no cyanosis or edema  ECOG PERFORMANCE STATUS: 1 - Symptomatic but completely ambulatory  Blood pressure 90/64, pulse 89, temperature 97.8 F (36.6 C), temperature source Tympanic, resp. rate 20, height 5\' 2"  (1.575 m), weight 150 lb 3.2 oz (68.1 kg), SpO2 98 %.  LABORATORY DATA: Lab Results  Component Value Date   WBC 6.0 12/07/2019   HGB 11.2 (L) 12/07/2019   HCT 33.5 (L) 12/07/2019   MCV 93.8 12/07/2019   PLT 260 12/07/2019      Chemistry      Component Value Date/Time   NA 139 11/10/2019 1059   K 3.7 11/10/2019 1059   CL 106 11/10/2019 1059   CO2 23 11/10/2019 1059   BUN 19 11/10/2019 1059   CREATININE 1.15 (H) 11/10/2019 1059      Component Value Date/Time   CALCIUM 10.3 11/10/2019 1059   ALKPHOS 101 11/10/2019 1059   AST 13 (L) 11/10/2019 1059   ALT 10 11/10/2019 1059   BILITOT 0.3 11/10/2019 1059       RADIOGRAPHIC STUDIES: No results found.  ASSESSMENT AND PLAN: This is a very pleasant 64 years old white female with stage IIIA non-small cell lung cancer, presented with right upper lobe lung nodule and mediastinal lymphadenopathy diagnosed in January 2021.   She completed a course of concurrent chemoradiation with weekly carboplatin and paclitaxel for 5 cycles and tolerated her treatment well.  She has partial response. The patient is currently undergoing consolidation treatment with Imfinzi 1500 mg IV every 4 weeks status post 5 cycles. The patient continues to tolerate this treatment well with no concerning complaints. I recommended for her to proceed with cycle #6 today as planned. I will see her back for follow-up visit in 4 weeks for evaluation with repeat CT scan of the chest for restaging  of her disease. The patient was advised to call immediately if she has any concerning symptoms in the interval. For the smoking cessation, I strongly encouraged the patient to quit smoking. The patient voices understanding of current disease status and treatment options and is in agreement with the current care plan.  All questions were answered. The patient knows to call the clinic with any problems, questions or concerns. We can certainly see the patient much sooner if necessary.   Disclaimer: This note was dictated with voice recognition software. Similar sounding words can inadvertently be transcribed and may not be corrected upon review.

## 2020-01-02 ENCOUNTER — Ambulatory Visit (HOSPITAL_COMMUNITY)
Admission: RE | Admit: 2020-01-02 | Discharge: 2020-01-02 | Disposition: A | Discharge status: Home / Self Care | Payer: Medicare Other | Source: Ambulatory Visit | Attending: Internal Medicine | Admitting: Internal Medicine

## 2020-01-02 ENCOUNTER — Other Ambulatory Visit: Payer: Self-pay

## 2020-01-02 DIAGNOSIS — C3490 Malignant neoplasm of unspecified part of unspecified bronchus or lung: Secondary | ICD-10-CM | POA: Insufficient documentation

## 2020-01-02 MED ORDER — IOHEXOL 300 MG/ML  SOLN
75.0000 mL | Freq: Once | INTRAMUSCULAR | Status: AC | PRN
  Administered 2020-01-02: 75 mL via INTRAVENOUS

## 2020-01-05 ENCOUNTER — Inpatient Hospital Stay: Payer: Medicare Other | Attending: Internal Medicine | Admitting: Internal Medicine

## 2020-01-05 ENCOUNTER — Other Ambulatory Visit: Payer: Self-pay

## 2020-01-05 ENCOUNTER — Inpatient Hospital Stay: Payer: Medicare Other

## 2020-01-05 ENCOUNTER — Encounter: Payer: Self-pay | Admitting: Internal Medicine

## 2020-01-05 VITALS — BP 89/55 | HR 81 | Temp 97.7°F | Resp 18 | Ht 62.0 in | Wt 148.2 lb

## 2020-01-05 VITALS — BP 94/55

## 2020-01-05 DIAGNOSIS — F17200 Nicotine dependence, unspecified, uncomplicated: Secondary | ICD-10-CM | POA: Diagnosis not present

## 2020-01-05 DIAGNOSIS — Z923 Personal history of irradiation: Secondary | ICD-10-CM | POA: Diagnosis not present

## 2020-01-05 DIAGNOSIS — M47814 Spondylosis without myelopathy or radiculopathy, thoracic region: Secondary | ICD-10-CM | POA: Insufficient documentation

## 2020-01-05 DIAGNOSIS — Z5112 Encounter for antineoplastic immunotherapy: Secondary | ICD-10-CM | POA: Diagnosis present

## 2020-01-05 DIAGNOSIS — Z79899 Other long term (current) drug therapy: Secondary | ICD-10-CM | POA: Diagnosis not present

## 2020-01-05 DIAGNOSIS — C3411 Malignant neoplasm of upper lobe, right bronchus or lung: Secondary | ICD-10-CM

## 2020-01-05 DIAGNOSIS — I708 Atherosclerosis of other arteries: Secondary | ICD-10-CM | POA: Diagnosis not present

## 2020-01-05 DIAGNOSIS — F17210 Nicotine dependence, cigarettes, uncomplicated: Secondary | ICD-10-CM | POA: Diagnosis not present

## 2020-01-05 DIAGNOSIS — R5383 Other fatigue: Secondary | ICD-10-CM | POA: Diagnosis not present

## 2020-01-05 DIAGNOSIS — J432 Centrilobular emphysema: Secondary | ICD-10-CM | POA: Diagnosis not present

## 2020-01-05 DIAGNOSIS — J439 Emphysema, unspecified: Secondary | ICD-10-CM | POA: Insufficient documentation

## 2020-01-05 DIAGNOSIS — R55 Syncope and collapse: Secondary | ICD-10-CM | POA: Diagnosis not present

## 2020-01-05 DIAGNOSIS — R188 Other ascites: Secondary | ICD-10-CM | POA: Insufficient documentation

## 2020-01-05 DIAGNOSIS — Z9221 Personal history of antineoplastic chemotherapy: Secondary | ICD-10-CM | POA: Diagnosis not present

## 2020-01-05 LAB — CBC WITH DIFFERENTIAL (CANCER CENTER ONLY)
Abs Immature Granulocytes: 0.01 10*3/uL (ref 0.00–0.07)
Basophils Absolute: 0.1 10*3/uL (ref 0.0–0.1)
Basophils Relative: 1 %
Eosinophils Absolute: 0.1 10*3/uL (ref 0.0–0.5)
Eosinophils Relative: 3 %
HCT: 33.1 % — ABNORMAL LOW (ref 36.0–46.0)
Hemoglobin: 10.9 g/dL — ABNORMAL LOW (ref 12.0–15.0)
Immature Granulocytes: 0 %
Lymphocytes Relative: 14 %
Lymphs Abs: 0.8 10*3/uL (ref 0.7–4.0)
MCH: 30.5 pg (ref 26.0–34.0)
MCHC: 32.9 g/dL (ref 30.0–36.0)
MCV: 92.7 fL (ref 80.0–100.0)
Monocytes Absolute: 0.6 10*3/uL (ref 0.1–1.0)
Monocytes Relative: 10 %
Neutro Abs: 3.8 10*3/uL (ref 1.7–7.7)
Neutrophils Relative %: 72 %
Platelet Count: 262 10*3/uL (ref 150–400)
RBC: 3.57 MIL/uL — ABNORMAL LOW (ref 3.87–5.11)
RDW: 14.1 % (ref 11.5–15.5)
WBC Count: 5.3 10*3/uL (ref 4.0–10.5)
nRBC: 0 % (ref 0.0–0.2)

## 2020-01-05 LAB — CMP (CANCER CENTER ONLY)
ALT: 10 U/L (ref 0–44)
AST: 12 U/L — ABNORMAL LOW (ref 15–41)
Albumin: 3.2 g/dL — ABNORMAL LOW (ref 3.5–5.0)
Alkaline Phosphatase: 97 U/L (ref 38–126)
Anion gap: 5 (ref 5–15)
BUN: 15 mg/dL (ref 8–23)
CO2: 28 mmol/L (ref 22–32)
Calcium: 9.3 mg/dL (ref 8.9–10.3)
Chloride: 105 mmol/L (ref 98–111)
Creatinine: 1.09 mg/dL — ABNORMAL HIGH (ref 0.44–1.00)
GFR, Estimated: 53 mL/min — ABNORMAL LOW (ref >60)
Glucose, Bld: 77 mg/dL (ref 70–99)
Potassium: 3.6 mmol/L (ref 3.5–5.1)
Sodium: 138 mmol/L (ref 135–145)
Total Bilirubin: 0.2 mg/dL — ABNORMAL LOW (ref 0.3–1.2)
Total Protein: 7.3 g/dL (ref 6.5–8.1)

## 2020-01-05 LAB — TSH: TSH: 2.89 u[IU]/mL (ref 0.308–3.960)

## 2020-01-05 MED ORDER — SODIUM CHLORIDE 0.9 % IV SOLN
1500.0000 mg | Freq: Once | INTRAVENOUS | Status: AC
  Administered 2020-01-05: 1500 mg via INTRAVENOUS
  Filled 2020-01-05: qty 30

## 2020-01-05 MED ORDER — SODIUM CHLORIDE 0.9 % IV SOLN
Freq: Once | INTRAVENOUS | Status: AC
  Filled 2020-01-05: qty 250

## 2020-01-05 NOTE — Patient Instructions (Signed)
[facility] Discharge Instructions for Patients Receiving Chemotherapy  Today you received the following chemotherapy agents: durvalumab.  To help prevent nausea and vomiting after your treatment, we encourage you to take your nausea medication as directed.   If you develop nausea and vomiting that is not controlled by your nausea medication, call the clinic.   BELOW ARE SYMPTOMS THAT SHOULD BE REPORTED IMMEDIATELY:  *FEVER GREATER THAN 100.5 F  *CHILLS WITH OR WITHOUT FEVER  NAUSEA AND VOMITING THAT IS NOT CONTROLLED WITH YOUR NAUSEA MEDICATION  *UNUSUAL SHORTNESS OF BREATH  *UNUSUAL BRUISING OR BLEEDING  TENDERNESS IN MOUTH AND THROAT WITH OR WITHOUT PRESENCE OF ULCERS  *URINARY PROBLEMS  *BOWEL PROBLEMS  UNUSUAL RASH Items with * indicate a potential emergency and should be followed up as soon as possible.  Feel free to call the clinic should you have any questions or concerns. The clinic phone number is [phone].  Please show the CHEMO ALERT CARD at check-in to the Emergency Department and triage nurse.   

## 2020-01-05 NOTE — Progress Notes (Signed)
[place] [phone]   Fax:([phone]) [place], [place] [address]  DIAGNOSIS: Stage IIIA (T1b, N2, M0) non-small cell lung cancer presented with right upper lobe lung nodule in addition to mediastinal lymphadenopathy diagnosed in January 2021.  PRIOR THERAPY: Concurrent chemoradiation with weekly carboplatin for AUC of 2 and paclitaxel 45 NG/M2. Status post5cycles. Last dose received on 05/30/19  CURRENT THERAPY: Consolidation immunotherapy with Imfinzi 1500 mg/kg IV every 4 weeks. First dose expected on 07/21/2019. Status post 6 cycles.   INTERVAL HISTORY: Betty Anderson 64 y.o. female returns to the clinic today for follow-up visit. The patient is feeling fine today with no concerning complaints except for fatigue and substernal chest pain last week that resolved spontaneously. She denied having any current chest pain, shortness of breath, cough or hemoptysis. She denied having any weight loss or night sweats. She has no nausea, vomiting, diarrhea or constipation. She has no headache or visual changes. Unfortunately she continues to smoke 2 pack of cigarettes every day and she is not willing to quit. She tolerated the last cycle of her treatment with immunotherapy fairly well. She had repeat CT scan of the chest performed recently and she is here for evaluation and discussion of her scan results before starting cycle #7.  MEDICAL HISTORY: Past Medical History:  Diagnosis Date  . COPD (chronic obstructive pulmonary disease) (HCC)    emphysema  . Hyperlipidemia   . Hypertension   . Lung nodule   . Paroxysmal atrial flutter ([place])    per cardiology note  . Peripheral artery disease ([place])   . Tobacco abuse   . Vitamin D deficiency   . Wears glasses     ALLERGIES:  has No Known Allergies.  MEDICATIONS:  Current Outpatient Medications  Medication Sig Dispense Refill  . albuterol  (PROVENTIL) (2.5 MG/3ML) 0.083% nebulizer solution Take 3 mLs (2.5 mg total) by nebulization every 6 (six) hours as needed for wheezing or shortness of breath. 75 mL 12  . aspirin EC 81 MG tablet Take 1 tablet (81 mg total) by mouth daily. 90 tablet 3  . Cholecalciferol (DIALYVITE VITAMIN D 5000) 125 MCG (5000 UT) capsule Take 5,000 Units by mouth daily.    Marland Kitchen escitalopram (LEXAPRO) 10 MG tablet Take 10 mg by mouth daily.    . rosuvastatin (CRESTOR) 20 MG tablet Take 20 mg by mouth every evening.     . sucralfate (CARAFATE) 1 g tablet Take 1 g by mouth 4 (four) times daily.    . Tiotropium Bromide-Olodaterol (STIOLTO RESPIMAT) 2.5-2.5 MCG/ACT AERS Inhale 2 puffs into the lungs daily. 4 g 0  . vitamin B-12 (CYANOCOBALAMIN) 1000 MCG tablet Take 1,000 mcg by mouth daily.     No current facility-administered medications for this visit.    SURGICAL HISTORY:  Past Surgical History:  Procedure Laterality Date  . BRONCHIAL NEEDLE ASPIRATION BIOPSY  04/12/2019   Procedure: BRONCHIAL NEEDLE ASPIRATION BIOPSIES;  Surgeon: Garner Nash, DO;  Location: [facility];  Service: Cardiopulmonary;;  . ENDOBRONCHIAL ULTRASOUND N/A 04/12/2019   Procedure: ENDOBRONCHIAL ULTRASOUND;  Surgeon: Garner Nash, DO;  Location: [facility];  Service: Cardiopulmonary;  Laterality: N/A;  . OVARIAN CYST REMOVAL    . TONSILLECTOMY    . TUBAL LIGATION    . VIDEO BRONCHOSCOPY N/A 04/12/2019   Procedure: VIDEO BRONCHOSCOPY WITHOUT FLUORO;  Surgeon: Garner Nash, DO;  Location: [facility];  Service: Cardiopulmonary;  Laterality: N/A;    REVIEW OF SYSTEMS:  Constitutional: positive for fatigue Eyes: negative Ears, nose, mouth, throat, and face: negative Respiratory: negative Cardiovascular: negative Gastrointestinal: negative Genitourinary:negative Integument/breast: negative Hematologic/lymphatic: negative Musculoskeletal:negative Neurological: negative Behavioral/Psych: negative Endocrine:  negative Allergic/Immunologic: negative   PHYSICAL EXAMINATION: General appearance: alert, cooperative, fatigued and no distress Head: Normocephalic, without obvious abnormality, atraumatic Neck: no adenopathy, no JVD, supple, symmetrical, trachea midline and thyroid not enlarged, symmetric, no tenderness/mass/nodules Lymph nodes: Cervical, supraclavicular, and axillary nodes normal. Resp: clear to auscultation bilaterally Back: symmetric, no curvature. ROM normal. No CVA tenderness. Cardio: regular rate and rhythm, S1, S2 normal, no murmur, click, rub or gallop GI: soft, non-tender; bowel sounds normal; no masses,  no organomegaly Extremities: extremities normal, atraumatic, no cyanosis or edema Neurologic: Alert and oriented X 3, normal strength and tone. Normal symmetric reflexes. Normal coordination and gait  ECOG PERFORMANCE STATUS: 1 - Symptomatic but completely ambulatory  Blood pressure (!) 89/55, pulse 81, temperature 97.7 F (36.5 C), temperature source Tympanic, resp. rate 18, height 5\' 2"  (1.575 m), weight 148 lb 3.2 oz (67.2 kg), SpO2 98 %.  LABORATORY DATA: Lab Results  Component Value Date   WBC 5.3 01/05/2020   HGB 10.9 (L) 01/05/2020   HCT 33.1 (L) 01/05/2020   MCV 92.7 01/05/2020   PLT 262 01/05/2020      Chemistry      Component Value Date/Time   NA 136 12/07/2019 0850   K 3.9 12/07/2019 0850   CL 106 12/07/2019 0850   CO2 22 12/07/2019 0850   BUN 21 12/07/2019 0850   CREATININE 1.37 (H) 12/07/2019 0850      Component Value Date/Time   CALCIUM 9.2 12/07/2019 0850   ALKPHOS 98 12/07/2019 0850   AST 13 (L) 12/07/2019 0850   ALT 9 12/07/2019 0850   BILITOT 0.2 (L) 12/07/2019 0850       RADIOGRAPHIC STUDIES: CT Chest W Contrast  Result Date: 01/02/2020 CLINICAL DATA:  Non-small cell lung cancer of the right upper lobe, restaging assessment. EXAM: CT CHEST WITH CONTRAST TECHNIQUE: Multidetector CT imaging of the chest was performed during intravenous  contrast administration. CONTRAST:  9mL OMNIPAQUE IOHEXOL 300 MG/ML  SOLN COMPARISON:  Multiple exams, including 10/11/2019 FINDINGS: Cardiovascular: Coronary, aortic arch, and branch vessel atherosclerotic vascular disease. Suspected occluded proximal 1.5 cm of the right subclavian artery, raising the possibility of subclavian steal syndrome. There is some focally striking left subclavian stenosis near the origin of the left vertebral artery as well. Mediastinum/Nodes: Right paratracheal node 0.8 cm in short axis on image 50 of series 2, previously 0.9 cm. Right axillary lymph node 0.8 cm in short axis on image 32 series 2, previously 0.9 cm. No current pathologic adenopathy identified. There is wall thickening in the mid esophagus, with ill definition of the esophageal walls for example on image 60 of series 2. Lungs/Pleura: Centrilobular emphysema. Peripheral interstitial accentuation noted. Airway plugging in the right upper lobe terminating in a more rounded region of nodularity, the elongated density in the bronchus measures about 1.2 cm in diameter and the terminal nodularity measures about 0.7 by 0.6 cm on image 41/7, previously 0.8 by 0.7 cm. There is some subtle peripheral centrilobular nodularity primarily favoring the upper lobes. The this is not appreciably changed from prior. Upper Abdomen: Reduced visible perihepatic ascites compared to previous. Musculoskeletal: Thoracic spondylosis. IMPRESSION: 1. Airway plugging in the right upper lobe terminating in a more rounded region of nodularity, mildly reduced in size from previous. Scattered  faint nodularity in the lungs otherwise similar to prior. 2. Coronary, aortic arch, and branch vessel atherosclerotic vascular disease. Suspected occluded proximal 1.5 cm of the right subclavian artery, raising the possibility of subclavian steal syndrome. There is some focally striking left subclavian stenosis near the origin of the left vertebral artery as well. 3.  Reduced visible perihepatic ascites compared to previous. 4. Centrilobular emphysema with peripheral interstitial accentuation. 5. Airway plugging in the right upper lobe terminating in a more rounded region of nodularity, favoring the right upper lobe bronchus. 6. Emphysema and aortic atherosclerosis. Aortic Atherosclerosis (ICD10-I70.0) and Emphysema (ICD10-J43.9). Electronically Signed   By: Van Clines M.D.   On: 01/02/2020 16:26    ASSESSMENT AND PLAN: This is a very pleasant 64 years old white female with stage IIIA non-small cell lung cancer, presented with right upper lobe lung nodule and mediastinal lymphadenopathy diagnosed in January 2021.   She completed a course of concurrent chemoradiation with weekly carboplatin and paclitaxel for 5 cycles and tolerated her treatment well.  She has partial response. The patient is currently undergoing consolidation treatment with Imfinzi 1500 mg IV every 4 weeks status post 6 cycles. The patient continues to tolerate this treatment well with no concerning adverse effects. She had repeat CT scan of the chest performed recently. I personally and independently reviewed the scans and discussed the results with the patient and her husband today. Her scan showed no concerning findings for disease progression. She had some vascular issues and suspicious subclavian steal syndrome. I recommended for the patient to continue her current treatment with maintenance immunotherapy with Imfinzi. She will proceed with cycle #7 today. The patient will come back for follow-up visit in 4 weeks for evaluation before starting cycle #8. I strongly advised the patient to quit smoking. She was advised to call immediately if she has any concerning symptoms in the interval. The patient voices understanding of current disease status and treatment options and is in agreement with the current care plan.  All questions were answered. The patient knows to call the clinic with  any problems, questions or concerns. We can certainly see the patient much sooner if necessary.   Disclaimer: This note was dictated with voice recognition software. Similar sounding words can inadvertently be transcribed and may not be corrected upon review.

## 2020-01-20 ENCOUNTER — Telehealth: Payer: Self-pay | Admitting: Physician Assistant

## 2020-01-20 NOTE — Telephone Encounter (Signed)
R/s 11/4 appt due to Betty Anderson being on [facility]. Called and left msg for patient to inform of time change. Mailed printout

## 2020-02-02 ENCOUNTER — Other Ambulatory Visit: Payer: Self-pay

## 2020-02-02 ENCOUNTER — Ambulatory Visit: Payer: Medicare Other | Admitting: Physician Assistant

## 2020-02-02 ENCOUNTER — Ambulatory Visit: Payer: Medicare Other

## 2020-02-02 ENCOUNTER — Encounter: Payer: Self-pay | Admitting: Internal Medicine

## 2020-02-02 ENCOUNTER — Inpatient Hospital Stay: Payer: Medicare Other

## 2020-02-02 ENCOUNTER — Inpatient Hospital Stay: Payer: Medicare Other | Attending: Internal Medicine

## 2020-02-02 ENCOUNTER — Other Ambulatory Visit: Payer: Medicare Other

## 2020-02-02 ENCOUNTER — Inpatient Hospital Stay (HOSPITAL_BASED_OUTPATIENT_CLINIC_OR_DEPARTMENT_OTHER): Payer: Medicare Other | Admitting: Internal Medicine

## 2020-02-02 VITALS — BP 104/61 | HR 80 | Temp 97.1°F | Resp 20 | Ht 62.0 in | Wt 151.5 lb

## 2020-02-02 DIAGNOSIS — C3411 Malignant neoplasm of upper lobe, right bronchus or lung: Secondary | ICD-10-CM

## 2020-02-02 DIAGNOSIS — F17210 Nicotine dependence, cigarettes, uncomplicated: Secondary | ICD-10-CM | POA: Diagnosis not present

## 2020-02-02 DIAGNOSIS — Z5112 Encounter for antineoplastic immunotherapy: Secondary | ICD-10-CM | POA: Diagnosis not present

## 2020-02-02 DIAGNOSIS — R590 Localized enlarged lymph nodes: Secondary | ICD-10-CM | POA: Insufficient documentation

## 2020-02-02 DIAGNOSIS — Z79899 Other long term (current) drug therapy: Secondary | ICD-10-CM | POA: Diagnosis not present

## 2020-02-02 DIAGNOSIS — R0602 Shortness of breath: Secondary | ICD-10-CM | POA: Insufficient documentation

## 2020-02-02 DIAGNOSIS — R0609 Other forms of dyspnea: Secondary | ICD-10-CM | POA: Diagnosis not present

## 2020-02-02 DIAGNOSIS — R059 Cough, unspecified: Secondary | ICD-10-CM | POA: Insufficient documentation

## 2020-02-02 LAB — CBC WITH DIFFERENTIAL (CANCER CENTER ONLY)
Abs Immature Granulocytes: 0.01 10*3/uL (ref 0.00–0.07)
Basophils Absolute: 0.1 10*3/uL (ref 0.0–0.1)
Basophils Relative: 2 %
Eosinophils Absolute: 0.1 10*3/uL (ref 0.0–0.5)
Eosinophils Relative: 3 %
HCT: 33.6 % — ABNORMAL LOW (ref 36.0–46.0)
Hemoglobin: 11.1 g/dL — ABNORMAL LOW (ref 12.0–15.0)
Immature Granulocytes: 0 %
Lymphocytes Relative: 14 %
Lymphs Abs: 0.7 10*3/uL (ref 0.7–4.0)
MCH: 30.8 pg (ref 26.0–34.0)
MCHC: 33 g/dL (ref 30.0–36.0)
MCV: 93.3 fL (ref 80.0–100.0)
Monocytes Absolute: 0.5 10*3/uL (ref 0.1–1.0)
Monocytes Relative: 9 %
Neutro Abs: 3.7 10*3/uL (ref 1.7–7.7)
Neutrophils Relative %: 72 %
Platelet Count: 291 10*3/uL (ref 150–400)
RBC: 3.6 MIL/uL — ABNORMAL LOW (ref 3.87–5.11)
RDW: 14 % (ref 11.5–15.5)
WBC Count: 5.1 10*3/uL (ref 4.0–10.5)
nRBC: 0 % (ref 0.0–0.2)

## 2020-02-02 LAB — CMP (CANCER CENTER ONLY)
ALT: 15 U/L (ref 0–44)
AST: 14 U/L — ABNORMAL LOW (ref 15–41)
Albumin: 3.3 g/dL — ABNORMAL LOW (ref 3.5–5.0)
Alkaline Phosphatase: 92 U/L (ref 38–126)
Anion gap: 7 (ref 5–15)
BUN: 14 mg/dL (ref 8–23)
CO2: 25 mmol/L (ref 22–32)
Calcium: 9.2 mg/dL (ref 8.9–10.3)
Chloride: 107 mmol/L (ref 98–111)
Creatinine: 1.23 mg/dL — ABNORMAL HIGH (ref 0.44–1.00)
GFR, Estimated: 49 mL/min — ABNORMAL LOW (ref >60)
Glucose, Bld: 78 mg/dL (ref 70–99)
Potassium: 4 mmol/L (ref 3.5–5.1)
Sodium: 139 mmol/L (ref 135–145)
Total Bilirubin: 0.3 mg/dL (ref 0.3–1.2)
Total Protein: 7.3 g/dL (ref 6.5–8.1)

## 2020-02-02 LAB — TSH: TSH: 2.433 u[IU]/mL (ref 0.308–3.960)

## 2020-02-02 MED ORDER — SODIUM CHLORIDE 0.9 % IV SOLN
Freq: Once | INTRAVENOUS | Status: AC
  Filled 2020-02-02: qty 250

## 2020-02-02 MED ORDER — SODIUM CHLORIDE 0.9 % IV SOLN
1500.0000 mg | Freq: Once | INTRAVENOUS | Status: AC
  Administered 2020-02-02: 1500 mg via INTRAVENOUS
  Filled 2020-02-02: qty 30

## 2020-02-02 NOTE — Patient Instructions (Signed)
[facility] Discharge Instructions for Patients Receiving Chemotherapy  Today you received the following chemotherapy agents: durvalumab.  To help prevent nausea and vomiting after your treatment, we encourage you to take your nausea medication as directed.   If you develop nausea and vomiting that is not controlled by your nausea medication, call the clinic.   BELOW ARE SYMPTOMS THAT SHOULD BE REPORTED IMMEDIATELY:  *FEVER GREATER THAN 100.5 F  *CHILLS WITH OR WITHOUT FEVER  NAUSEA AND VOMITING THAT IS NOT CONTROLLED WITH YOUR NAUSEA MEDICATION  *UNUSUAL SHORTNESS OF BREATH  *UNUSUAL BRUISING OR BLEEDING  TENDERNESS IN MOUTH AND THROAT WITH OR WITHOUT PRESENCE OF ULCERS  *URINARY PROBLEMS  *BOWEL PROBLEMS  UNUSUAL RASH Items with * indicate a potential emergency and should be followed up as soon as possible.  Feel free to call the clinic should you have any questions or concerns. The clinic phone number is [phone].  Please show the CHEMO ALERT CARD at check-in to the Emergency Department and triage nurse.   

## 2020-02-02 NOTE — Progress Notes (Signed)
[place] [phone]   Fax:(336) [place], MD [place] [address]  DIAGNOSIS: Stage IIIA (T1b, N2, M0) non-small cell lung cancer presented with right upper lobe lung nodule in addition to mediastinal lymphadenopathy diagnosed in January 2021.  PRIOR THERAPY: Concurrent chemoradiation with weekly carboplatin for AUC of 2 and paclitaxel 45 NG/M2. Status post5cycles. Last dose received on 05/30/19  CURRENT THERAPY: Consolidation immunotherapy with Imfinzi 1500 mg/kg IV every 4 weeks. First dose expected on 07/21/2019. Status post 7 cycles.   INTERVAL HISTORY: Betty Anderson 64 y.o. female returns to the clinic today for follow-up visit accompanied by her husband.  The patient is feeling fine today with no concerning complaints except for the baseline shortness of breath and cough.  She denied having any nausea, vomiting, diarrhea or constipation.  She denied having any chest pain or hemoptysis.  She has no fever or chills.  The patient has no significant weight loss or night sweats.  She continues to tolerate her treatment with Imfinzi fairly well.  She is here today for evaluation before starting cycle #8 of her treatment.  MEDICAL HISTORY: Past Medical History:  Diagnosis Date  . COPD (chronic obstructive pulmonary disease) (HCC)    emphysema  . Hyperlipidemia   . Hypertension   . Lung nodule   . Paroxysmal atrial flutter ([place])    per cardiology note  . Peripheral artery disease ([place])   . Tobacco abuse   . Vitamin D deficiency   . Wears glasses     ALLERGIES:  has No Known Allergies.  MEDICATIONS:  Current Outpatient Medications  Medication Sig Dispense Refill  . albuterol (PROVENTIL) (2.5 MG/3ML) 0.083% nebulizer solution Take 3 mLs (2.5 mg total) by nebulization every 6 (six) hours as needed for wheezing or shortness of breath. 75 mL 12  . aspirin EC 81 MG tablet Take 1 tablet (81  mg total) by mouth daily. 90 tablet 3  . Cholecalciferol (DIALYVITE VITAMIN D 5000) 125 MCG (5000 UT) capsule Take 5,000 Units by mouth daily.    Marland Kitchen escitalopram (LEXAPRO) 10 MG tablet Take 10 mg by mouth daily.    . rosuvastatin (CRESTOR) 20 MG tablet Take 20 mg by mouth every evening.     . sucralfate (CARAFATE) 1 g tablet Take 1 g by mouth 4 (four) times daily.    . Tiotropium Bromide-Olodaterol (STIOLTO RESPIMAT) 2.5-2.5 MCG/ACT AERS Inhale 2 puffs into the lungs daily. 4 g 0  . vitamin B-12 (CYANOCOBALAMIN) 1000 MCG tablet Take 1,000 mcg by mouth daily.     No current facility-administered medications for this visit.    SURGICAL HISTORY:  Past Surgical History:  Procedure Laterality Date  . BRONCHIAL NEEDLE ASPIRATION BIOPSY  04/12/2019   Procedure: BRONCHIAL NEEDLE ASPIRATION BIOPSIES;  Surgeon: Garner Nash, DO;  Location: [facility];  Service: Cardiopulmonary;;  . ENDOBRONCHIAL ULTRASOUND N/A 04/12/2019   Procedure: ENDOBRONCHIAL ULTRASOUND;  Surgeon: Garner Nash, DO;  Location: [facility];  Service: Cardiopulmonary;  Laterality: N/A;  . OVARIAN CYST REMOVAL    . TONSILLECTOMY    . TUBAL LIGATION    . VIDEO BRONCHOSCOPY N/A 04/12/2019   Procedure: VIDEO BRONCHOSCOPY WITHOUT FLUORO;  Surgeon: Garner Nash, DO;  Location: [facility];  Service: Cardiopulmonary;  Laterality: N/A;    REVIEW OF SYSTEMS:  A comprehensive review of systems was negative except for: Respiratory: positive for cough and dyspnea on exertion   PHYSICAL  EXAMINATION: General appearance: alert, cooperative, fatigued and no distress Head: Normocephalic, without obvious abnormality, atraumatic Neck: no adenopathy, no JVD, supple, symmetrical, trachea midline and thyroid not enlarged, symmetric, no tenderness/mass/nodules Lymph nodes: Cervical, supraclavicular, and axillary nodes normal. Resp: clear to auscultation bilaterally Back: symmetric, no curvature. ROM normal. No CVA tenderness. Cardio:  regular rate and rhythm, S1, S2 normal, no murmur, click, rub or gallop GI: soft, non-tender; bowel sounds normal; no masses,  no organomegaly Extremities: extremities normal, atraumatic, no cyanosis or edema  ECOG PERFORMANCE STATUS: 1 - Symptomatic but completely ambulatory  Blood pressure 104/61, pulse 80, temperature (!) 97.1 F (36.2 C), temperature source Tympanic, resp. rate 20, height 5\' 2"  (1.575 m), weight 151 lb 8 oz (68.7 kg), SpO2 100 %.  LABORATORY DATA: Lab Results  Component Value Date   WBC 5.1 02/02/2020   HGB 11.1 (L) 02/02/2020   HCT 33.6 (L) 02/02/2020   MCV 93.3 02/02/2020   PLT 291 02/02/2020      Chemistry      Component Value Date/Time   NA 138 01/05/2020 1149   K 3.6 01/05/2020 1149   CL 105 01/05/2020 1149   CO2 28 01/05/2020 1149   BUN 15 01/05/2020 1149   CREATININE 1.09 (H) 01/05/2020 1149      Component Value Date/Time   CALCIUM 9.3 01/05/2020 1149   ALKPHOS 97 01/05/2020 1149   AST 12 (L) 01/05/2020 1149   ALT 10 01/05/2020 1149   BILITOT 0.2 (L) 01/05/2020 1149       RADIOGRAPHIC STUDIES: No results found.  ASSESSMENT AND PLAN: This is a very pleasant 64 years old white female with stage IIIA non-small cell lung cancer, presented with right upper lobe lung nodule and mediastinal lymphadenopathy diagnosed in January 2021.   She completed a course of concurrent chemoradiation with weekly carboplatin and paclitaxel for 5 cycles and tolerated her treatment well.  She has partial response. The patient is currently undergoing consolidation treatment with Imfinzi 1500 mg IV every 4 weeks status post 7 cycles. The patient continues to tolerate her treatment well with no concerning adverse effects. I recommended for her to proceed with cycle #8 today as planned. She will come back for follow-up visit in 4 weeks for evaluation before starting cycle #9. I continue to advise the patient to quit smoking but unfortunately she continues to smoke 2-3  packs of cigarettes every day. She was advised to call immediately if she has any concerning symptoms in the interval. The patient voices understanding of current disease status and treatment options and is in agreement with the current care plan.  All questions were answered. The patient knows to call the clinic with any problems, questions or concerns. We can certainly see the patient much sooner if necessary.   Disclaimer: This note was dictated with voice recognition software. Similar sounding words can inadvertently be transcribed and may not be corrected upon review.

## 2020-02-22 ENCOUNTER — Other Ambulatory Visit: Payer: Self-pay

## 2020-02-22 ENCOUNTER — Ambulatory Visit (HOSPITAL_COMMUNITY)
Admission: RE | Admit: 2020-02-22 | Discharge: 2020-02-22 | Disposition: A | Discharge status: Home / Self Care | Payer: Medicare Other | Source: Ambulatory Visit | Attending: Vascular Surgery | Admitting: Vascular Surgery

## 2020-02-22 ENCOUNTER — Other Ambulatory Visit (HOSPITAL_COMMUNITY): Payer: Self-pay | Admitting: Internal Medicine

## 2020-02-22 DIAGNOSIS — I6529 Occlusion and stenosis of unspecified carotid artery: Secondary | ICD-10-CM | POA: Insufficient documentation

## 2020-02-27 NOTE — Progress Notes (Signed)
[facility] OFFICE PROGRESS NOTE  [name], MD [place] [address]  DIAGNOSIS: Stage IIIA (T1b, N2, M0) non-small cell lung cancer presented with right upper lobe lung nodule in addition to mediastinal lymphadenopathy diagnosed in January 2021  PRIOR THERAPY: Concurrent chemoradiation with weekly carboplatin for AUC of 2 and paclitaxel 45 NG/M2. Status post5cycles. Last dose received on 05/30/19  CURRENT THERAPY: Consolidation immunotherapy with Imfinzi 1500 mg/kg IV every 4 weeks. First dose expected on 07/21/2019. Status post 8 cycles.  INTERVAL HISTORY: Betty Anderson 64 y.o. female returns to the clinic today for a follow up visit accompanied by her husband. The patient is feeling fairly well today without any concerning complaints except she tripped over a step on her porch over the weekend. She fell on her right side. She did not hit her head. A few days later, she had pain in her posterior right rib cage that felt like a sharp "catching pain" for about 2-3 days that was worse with inspiration. She notes her breathing is better now and her pain has subsided and it is a 1/10 on the pain scale. Denies leg swelling.  Also in the interval since her last appointment, she saw her PCP for dysphagia who referred her to GI for consideration of upper GI. The patient continues to tolerate her treatment with immunotherapy with Imfinzi well without any concerning complaints. Denies any fever, chills,or night sweats. Her weight is stable. Denies any chest painor hemoptysis. The patient continues to smoke approximately 1.5 packs of cigarettes per day at least.She notes an associated cough for which she takes mucinex. Denies sore throat or nasal congestion.Denies any nausea, vomiting, diarrhea, or constipation. Denies any headache or visual changes. Denies any rash. The patient is here today for evaluationbefore starting cycle #9  MEDICAL HISTORY: Past Medical  History:  Diagnosis Date  . COPD (chronic obstructive pulmonary disease) (HCC)    emphysema  . Hyperlipidemia   . Hypertension   . Lung nodule   . Paroxysmal atrial flutter ([place])    per cardiology note  . Peripheral artery disease ([place])   . Tobacco abuse   . Vitamin D deficiency   . Wears glasses     ALLERGIES:  has No Known Allergies.  MEDICATIONS:  Current Outpatient Medications  Medication Sig Dispense Refill  . albuterol (PROVENTIL) (2.5 MG/3ML) 0.083% nebulizer solution Take 3 mLs (2.5 mg total) by nebulization every 6 (six) hours as needed for wheezing or shortness of breath. 75 mL 12  . aspirin EC 81 MG tablet Take 1 tablet (81 mg total) by mouth daily. 90 tablet 3  . Cholecalciferol (DIALYVITE VITAMIN D 5000) 125 MCG (5000 UT) capsule Take 5,000 Units by mouth daily.    Marland Kitchen escitalopram (LEXAPRO) 10 MG tablet Take 10 mg by mouth daily.    . rosuvastatin (CRESTOR) 20 MG tablet Take 20 mg by mouth every evening.     . sucralfate (CARAFATE) 1 g tablet Take 1 g by mouth 4 (four) times daily.    . Tiotropium Bromide-Olodaterol (STIOLTO RESPIMAT) 2.5-2.5 MCG/ACT AERS Inhale 2 puffs into the lungs daily. 4 g 0  . vitamin B-12 (CYANOCOBALAMIN) 1000 MCG tablet Take 1,000 mcg by mouth daily.     No current facility-administered medications for this visit.    SURGICAL HISTORY:  Past Surgical History:  Procedure Laterality Date  . BRONCHIAL NEEDLE ASPIRATION BIOPSY  04/12/2019   Procedure: BRONCHIAL NEEDLE ASPIRATION BIOPSIES;  Surgeon: Garner Nash, DO;  Location:  [facility];  Service: Cardiopulmonary;;  . ENDOBRONCHIAL ULTRASOUND N/A 04/12/2019   Procedure: ENDOBRONCHIAL ULTRASOUND;  Surgeon: Garner Nash, DO;  Location: [facility];  Service: Cardiopulmonary;  Laterality: N/A;  . OVARIAN CYST REMOVAL    . TONSILLECTOMY    . TUBAL LIGATION    . VIDEO BRONCHOSCOPY N/A 04/12/2019   Procedure: VIDEO BRONCHOSCOPY WITHOUT FLUORO;  Surgeon: Garner Nash, DO;  Location: [facility];  Service: Cardiopulmonary;  Laterality: N/A;    REVIEW OF SYSTEMS:   Review of Systems  Constitutional: Positive for fatigue. Negative for appetite change, chills, fever and unexpected weight change.  HENT: Positive for trouble swallowing. Negative for mouth sores, nosebleeds, and sore throat.  Eyes: Negative for eye problems and icterus.  Respiratory: Positive for cough and dyspnea on exertion. Negative for hemoptysis, and wheezing.   Cardiovascular: Negative for chest pain and leg swelling.  Gastrointestinal: Negative for abdominal pain, constipation, diarrhea, nausea and vomiting.  Genitourinary: Negative for bladder incontinence, difficulty urinating, dysuria, frequency and hematuria.   Musculoskeletal: Positive for right posterior rib pain. Negative for back pain, gait problem, neck pain and neck stiffness.  Skin: Negative for itching and rash.  Neurological: Negative for dizziness, extremity weakness, gait problem, headaches, light-headedness and seizures.  Hematological: Negative for adenopathy. Does not bruise/bleed easily.  Psychiatric/Behavioral: Negative for confusion, depression and sleep disturbance. The patient is not nervous/anxious.     PHYSICAL EXAMINATION:  Blood pressure 91/62, pulse 73, temperature (!) 97.4 F (36.3 C), temperature source Tympanic, resp. rate 18, height 5\' 2"  (1.575 m), weight 151 lb (68.5 kg), SpO2 100 %.  ECOG PERFORMANCE STATUS: 1 - Symptomatic but completely ambulatory  Physical Exam  Constitutional: Oriented to person, place, and time and cachectic appearing female and in no distress.  HENT:  Head: Normocephalic and atraumatic.  Mouth/Throat: Oropharynx is clear and moist. No oropharyngeal exudate.  Eyes: Conjunctivae are normal. Right eye exhibits no discharge. Left eye exhibits no discharge. No scleral icterus.  Neck: Normal range of motion. Neck supple.  Cardiovascular: Normal rate, regular rhythm, quiet systolic murmur in the  left 4th intercostal space. intact distal pulses.   Pulmonary/Chest: Effort normal and breath sounds normal. No respiratory distress. No wheezes. No rales.  Abdominal: Soft. Bowel sounds are normal. Exhibits no distension and no mass. There is no tenderness.  Musculoskeletal: No focal tenderness to palpation in her back. Normal range of motion. Exhibits no edema.  Lymphadenopathy:    No cervical adenopathy.  Neurological: Alert and oriented to person, place, and time. Exhibits normal muscle tone. Gait normal. Coordination normal.  Skin: Skin is warm and dry. No rash noted. Not diaphoretic. No erythema. No pallor.  Psychiatric: Mood, memory and judgment normal.  Vitals reviewed.  LABORATORY DATA: Lab Results  Component Value Date   WBC 6.0 03/01/2020   HGB 11.2 (L) 03/01/2020   HCT 34.0 (L) 03/01/2020   MCV 92.9 03/01/2020   PLT 310 03/01/2020      Chemistry      Component Value Date/Time   NA 138 03/01/2020 0832   K 4.2 03/01/2020 0832   CL 107 03/01/2020 0832   CO2 21 (L) 03/01/2020 0832   BUN 17 03/01/2020 0832   CREATININE 1.30 (H) 03/01/2020 0832      Component Value Date/Time   CALCIUM 9.5 03/01/2020 0832   ALKPHOS 100 03/01/2020 0832   AST 10 (L) 03/01/2020 0832   ALT 7 03/01/2020 0832   BILITOT <0.2 (L) 03/01/2020 2992  RADIOGRAPHIC STUDIES:  VAS US CAROTID  Result Date: 02/22/2020 Carotid Arterial Duplex Study Indications:       Carotid artery disease. Risk Factors:      Hypertension, hyperlipidemia, current smoker. Comparison Study:  02/09/2019: Rt- 1-39% ICA stenosis. Retrograde vertebral                    artery. Monophasic subclavian artery; Lt- 60-79% ICA                    stenosis. Bidirectional vertebral artery. Monophasic                    subclavian. Performing Technologist: Ivan Croft  Examination Guidelines: A complete evaluation includes B-mode imaging, spectral Doppler, color Doppler, and power Doppler as needed of all accessible portions  of each vessel. Bilateral testing is considered an integral part of a complete examination. Limited examinations for reoccurring indications may be performed as noted.  Right Carotid Findings: +----------+--------+--------+--------+-------------------------+--------+           PSV cm/sEDV cm/sStenosisPlaque Description       Comments +----------+--------+--------+--------+-------------------------+--------+ CCA Prox  162     21              smooth                            +----------+--------+--------+--------+-------------------------+--------+ CCA Mid   111     17                                                +----------+--------+--------+--------+-------------------------+--------+ CCA Distal154     20              heterogenous                      +----------+--------+--------+--------+-------------------------+--------+ ICA Prox  97      19      1-39%   calcific and heterogenous         +----------+--------+--------+--------+-------------------------+--------+ ICA Mid   143     34                                                +----------+--------+--------+--------+-------------------------+--------+ ICA Distal147     30                                                +----------+--------+--------+--------+-------------------------+--------+ ECA       148     7               heterogenous                      +----------+--------+--------+--------+-------------------------+--------+ +----------+--------+-------+----------+-------------------+           PSV cm/sEDV cmsDescribe  Arm Pressure (mmHG) +----------+--------+-------+----------+-------------------+ [id number]             monophasic                    +----------+--------+-------+----------+-------------------+ +---------+--------+--+--------+----------+ VertebralPSV cm/s88EDV cm/sRetrograde +---------+--------+--+--------+----------+  Left Carotid Findings:  +----------+--------+--------+--------+------------------+--------+  PSV cm/sEDV cm/sStenosisPlaque DescriptionComments +----------+--------+--------+--------+------------------+--------+ CCA Prox  217     30              heterogenous               +----------+--------+--------+--------+------------------+--------+ CCA Mid   177     27              heterogenous               +----------+--------+--------+--------+------------------+--------+ CCA Distal114     17              heterogenous               +----------+--------+--------+--------+------------------+--------+ ICA Prox  468     85      60-79%  heterogenous               +----------+--------+--------+--------+------------------+--------+ ICA Mid   353     78      60-79%  heterogenous               +----------+--------+--------+--------+------------------+--------+ ICA Distal162     35                                         +----------+--------+--------+--------+------------------+--------+ ECA       144     7               smooth                     +----------+--------+--------+--------+------------------+--------+ +----------+--------+--------+----------+-------------------+           PSV cm/sEDV cm/sDescribe  Arm Pressure (mmHG) +----------+--------+--------+----------+-------------------+ [id number]             monophasic                    +----------+--------+--------+----------+-------------------+ +---------+--------+--+--------+-+---------------+ VertebralPSV cm/s43EDV cm/s6Bi- directional +---------+--------+--+--------+-+---------------+   Summary: Right Carotid: Velocities in the right ICA are consistent with a 1-39% stenosis. Left Carotid: Velocities in the left ICA are consistent with a 60-79% stenosis. Vertebrals: Right vertebral artery demonstrates retrograde flow. Left vertebral             artery demonstrates bidirectional flow. *See table(s) above for  measurements and observations.  Electronically signed by Deitra Mayo MD on 02/22/2020 at 3:48:44 PM.    Final      ASSESSMENT/PLAN:  This is a very pleasant 64 year old Caucasian female diagnosed with stage IIIa non-small cell lung cancer.She presented with a right upper lobe lung mass and mediastinal lymphadenopathy. She was diagnosed in January 2021.  She recently completed 5 cycles of weekly concurrent chemoradiation with carboplatin for an AUC of 2 and paclitaxel 45 mg/m.She is status post 5 cycles of treatment.  She is currently undergoing consolidation immunotherapy with Imfinzi 1500 mg IV every 4 weeks. She is status post 8 cycles and she tolerated it well without any concerning adverse side effects.  Labs were reviewed. Recommend that she proceed with cycle #9 today as scheduled.   I will arrange for a restaging CT scan of the chest prior to her next cycle of treatment.   We will see her back for a follow up visit in 4 weeks for evaluation and to review her scan before starting cycle #10.   Regarding her fall and subsequent posterior rib pain. Offered patient an xray to rule out fracture. Her pain has improved at  this time and she rates it a 1/10. Her oxygen saturation is 100%. Pulse and respirations WNL. No focal tenderness on exam. No calf pain or swelling. We will hold off on imaging at this time but she was advised to call us if her pain re-occurs and we can arrange for imaging. I advised that if she develops associated shortness of breath, light headedness, diaphoresis, etc, that she should be evaluated in the emergency room.   The patient was strongly encouraged to quit smoking. She is not interested in quitting at this time.   The patient has hypotension at baseline at many office visits. She is asymptomatic. She was advised to increase her oral intake. We will administer 500 ccs of fluid while in the clinic today.   The patient was advised to call  immediately if she has any concerning symptoms in the interval. The patient voices understanding of current disease status and treatment options and is in agreement with the current care plan. All questions were answered. The patient knows to call the clinic with any problems, questions or concerns. We can certainly see the patient much sooner if necessary   Orders Placed This Encounter  Procedures  . CT Chest W Contrast    Standing Status:   Future    Standing Expiration Date:   03/01/2021    Order Specific Question:   If indicated for the ordered procedure, I authorize the administration of contrast media per Radiology protocol    Answer:   Yes    Order Specific Question:   Preferred imaging location?    Answer:   [place], PA-C 03/01/20

## 2020-03-01 ENCOUNTER — Inpatient Hospital Stay: Payer: Medicare Other

## 2020-03-01 ENCOUNTER — Other Ambulatory Visit: Payer: Self-pay

## 2020-03-01 ENCOUNTER — Inpatient Hospital Stay: Payer: Medicare Other | Attending: Internal Medicine | Admitting: Physician Assistant

## 2020-03-01 VITALS — BP 91/62 | HR 73 | Temp 97.4°F | Resp 18 | Ht 62.0 in | Wt 151.0 lb

## 2020-03-01 DIAGNOSIS — C3411 Malignant neoplasm of upper lobe, right bronchus or lung: Secondary | ICD-10-CM | POA: Diagnosis present

## 2020-03-01 DIAGNOSIS — I10 Essential (primary) hypertension: Secondary | ICD-10-CM | POA: Insufficient documentation

## 2020-03-01 DIAGNOSIS — R590 Localized enlarged lymph nodes: Secondary | ICD-10-CM | POA: Insufficient documentation

## 2020-03-01 DIAGNOSIS — I959 Hypotension, unspecified: Secondary | ICD-10-CM | POA: Diagnosis not present

## 2020-03-01 DIAGNOSIS — F17210 Nicotine dependence, cigarettes, uncomplicated: Secondary | ICD-10-CM | POA: Insufficient documentation

## 2020-03-01 DIAGNOSIS — R1310 Dysphagia, unspecified: Secondary | ICD-10-CM | POA: Insufficient documentation

## 2020-03-01 DIAGNOSIS — Z923 Personal history of irradiation: Secondary | ICD-10-CM | POA: Diagnosis not present

## 2020-03-01 DIAGNOSIS — R5383 Other fatigue: Secondary | ICD-10-CM | POA: Insufficient documentation

## 2020-03-01 DIAGNOSIS — Z9221 Personal history of antineoplastic chemotherapy: Secondary | ICD-10-CM | POA: Diagnosis not present

## 2020-03-01 DIAGNOSIS — Z5112 Encounter for antineoplastic immunotherapy: Secondary | ICD-10-CM | POA: Insufficient documentation

## 2020-03-01 DIAGNOSIS — E785 Hyperlipidemia, unspecified: Secondary | ICD-10-CM | POA: Diagnosis not present

## 2020-03-01 DIAGNOSIS — R059 Cough, unspecified: Secondary | ICD-10-CM | POA: Diagnosis not present

## 2020-03-01 DIAGNOSIS — R0609 Other forms of dyspnea: Secondary | ICD-10-CM | POA: Insufficient documentation

## 2020-03-01 DIAGNOSIS — I700 Atherosclerosis of aorta: Secondary | ICD-10-CM | POA: Diagnosis not present

## 2020-03-01 DIAGNOSIS — Z79899 Other long term (current) drug therapy: Secondary | ICD-10-CM | POA: Diagnosis not present

## 2020-03-01 DIAGNOSIS — I2510 Atherosclerotic heart disease of native coronary artery without angina pectoris: Secondary | ICD-10-CM | POA: Diagnosis not present

## 2020-03-01 DIAGNOSIS — R0781 Pleurodynia: Secondary | ICD-10-CM | POA: Insufficient documentation

## 2020-03-01 LAB — CBC WITH DIFFERENTIAL (CANCER CENTER ONLY)
Abs Immature Granulocytes: 0.02 10*3/uL (ref 0.00–0.07)
Basophils Absolute: 0.1 10*3/uL (ref 0.0–0.1)
Basophils Relative: 1 %
Eosinophils Absolute: 0.2 10*3/uL (ref 0.0–0.5)
Eosinophils Relative: 3 %
HCT: 34 % — ABNORMAL LOW (ref 36.0–46.0)
Hemoglobin: 11.2 g/dL — ABNORMAL LOW (ref 12.0–15.0)
Immature Granulocytes: 0 %
Lymphocytes Relative: 11 %
Lymphs Abs: 0.7 10*3/uL (ref 0.7–4.0)
MCH: 30.6 pg (ref 26.0–34.0)
MCHC: 32.9 g/dL (ref 30.0–36.0)
MCV: 92.9 fL (ref 80.0–100.0)
Monocytes Absolute: 0.5 10*3/uL (ref 0.1–1.0)
Monocytes Relative: 9 %
Neutro Abs: 4.6 10*3/uL (ref 1.7–7.7)
Neutrophils Relative %: 76 %
Platelet Count: 310 10*3/uL (ref 150–400)
RBC: 3.66 MIL/uL — ABNORMAL LOW (ref 3.87–5.11)
RDW: 13.6 % (ref 11.5–15.5)
WBC Count: 6 10*3/uL (ref 4.0–10.5)
nRBC: 0 % (ref 0.0–0.2)

## 2020-03-01 LAB — CMP (CANCER CENTER ONLY)
ALT: 7 U/L (ref 0–44)
AST: 10 U/L — ABNORMAL LOW (ref 15–41)
Albumin: 3.2 g/dL — ABNORMAL LOW (ref 3.5–5.0)
Alkaline Phosphatase: 100 U/L (ref 38–126)
Anion gap: 10 (ref 5–15)
BUN: 17 mg/dL (ref 8–23)
CO2: 21 mmol/L — ABNORMAL LOW (ref 22–32)
Calcium: 9.5 mg/dL (ref 8.9–10.3)
Chloride: 107 mmol/L (ref 98–111)
Creatinine: 1.3 mg/dL — ABNORMAL HIGH (ref 0.44–1.00)
GFR, Estimated: 45 mL/min — ABNORMAL LOW (ref >60)
Glucose, Bld: 75 mg/dL (ref 70–99)
Potassium: 4.2 mmol/L (ref 3.5–5.1)
Sodium: 138 mmol/L (ref 135–145)
Total Bilirubin: <0.2 mg/dL — ABNORMAL LOW (ref 0.3–1.2)
Total Protein: 7.3 g/dL (ref 6.5–8.1)

## 2020-03-01 LAB — TSH: TSH: 2.491 u[IU]/mL (ref 0.308–3.960)

## 2020-03-01 MED ORDER — SODIUM CHLORIDE 0.9 % IV SOLN
Freq: Once | INTRAVENOUS | Status: AC
  Filled 2020-03-01: qty 250

## 2020-03-01 MED ORDER — SODIUM CHLORIDE 0.9 % IV SOLN
Freq: Once | INTRAVENOUS | Status: DC
  Filled 2020-03-01: qty 250

## 2020-03-01 MED ORDER — SODIUM CHLORIDE 0.9 % IV SOLN
1500.0000 mg | Freq: Once | INTRAVENOUS | Status: AC
  Administered 2020-03-01: 1500 mg via INTRAVENOUS
  Filled 2020-03-01: qty 30

## 2020-03-01 NOTE — Patient Instructions (Signed)
[facility] Discharge Instructions for Patients Receiving Chemotherapy  Today you received the following chemotherapy agents: durvalumab.  To help prevent nausea and vomiting after your treatment, we encourage you to take your nausea medication as directed.   If you develop nausea and vomiting that is not controlled by your nausea medication, call the clinic.   BELOW ARE SYMPTOMS THAT SHOULD BE REPORTED IMMEDIATELY:  *FEVER GREATER THAN 100.5 F  *CHILLS WITH OR WITHOUT FEVER  NAUSEA AND VOMITING THAT IS NOT CONTROLLED WITH YOUR NAUSEA MEDICATION  *UNUSUAL SHORTNESS OF BREATH  *UNUSUAL BRUISING OR BLEEDING  TENDERNESS IN MOUTH AND THROAT WITH OR WITHOUT PRESENCE OF ULCERS  *URINARY PROBLEMS  *BOWEL PROBLEMS  UNUSUAL RASH Items with * indicate a potential emergency and should be followed up as soon as possible.  Feel free to call the clinic should you have any questions or concerns. The clinic phone number is [phone].  Please show the CHEMO ALERT CARD at check-in to the Emergency Department and triage nurse.   

## 2020-03-26 ENCOUNTER — Encounter (HOSPITAL_COMMUNITY): Payer: Self-pay

## 2020-03-26 ENCOUNTER — Other Ambulatory Visit: Payer: Self-pay

## 2020-03-26 ENCOUNTER — Ambulatory Visit (HOSPITAL_COMMUNITY)
Admission: RE | Admit: 2020-03-26 | Discharge: 2020-03-26 | Disposition: A | Discharge status: Home / Self Care | Payer: Medicare Other | Source: Ambulatory Visit | Attending: Physician Assistant | Admitting: Physician Assistant

## 2020-03-26 DIAGNOSIS — C3411 Malignant neoplasm of upper lobe, right bronchus or lung: Secondary | ICD-10-CM | POA: Diagnosis present

## 2020-03-26 HISTORY — DX: Malignant (primary) neoplasm, unspecified: C80.1

## 2020-03-26 MED ORDER — IOHEXOL 300 MG/ML  SOLN
75.0000 mL | Freq: Once | INTRAMUSCULAR | Status: AC | PRN
  Administered 2020-03-26: 14:00:00 75 mL via INTRAVENOUS

## 2020-03-29 ENCOUNTER — Inpatient Hospital Stay (HOSPITAL_BASED_OUTPATIENT_CLINIC_OR_DEPARTMENT_OTHER): Payer: Medicare Other | Admitting: Internal Medicine

## 2020-03-29 ENCOUNTER — Inpatient Hospital Stay: Payer: Medicare Other

## 2020-03-29 ENCOUNTER — Telehealth: Payer: Self-pay | Admitting: Internal Medicine

## 2020-03-29 ENCOUNTER — Other Ambulatory Visit: Payer: Self-pay

## 2020-03-29 VITALS — BP 91/65 | HR 91 | Temp 97.5°F | Resp 17 | Ht 62.0 in | Wt 153.4 lb

## 2020-03-29 DIAGNOSIS — C3411 Malignant neoplasm of upper lobe, right bronchus or lung: Secondary | ICD-10-CM

## 2020-03-29 DIAGNOSIS — Z5112 Encounter for antineoplastic immunotherapy: Secondary | ICD-10-CM

## 2020-03-29 DIAGNOSIS — F17200 Nicotine dependence, unspecified, uncomplicated: Secondary | ICD-10-CM | POA: Diagnosis not present

## 2020-03-29 LAB — CBC WITH DIFFERENTIAL (CANCER CENTER ONLY)
Abs Immature Granulocytes: 0.02 10*3/uL (ref 0.00–0.07)
Basophils Absolute: 0.1 10*3/uL (ref 0.0–0.1)
Basophils Relative: 1 %
Eosinophils Absolute: 0.2 10*3/uL (ref 0.0–0.5)
Eosinophils Relative: 3 %
HCT: 35.6 % — ABNORMAL LOW (ref 36.0–46.0)
Hemoglobin: 11.9 g/dL — ABNORMAL LOW (ref 12.0–15.0)
Immature Granulocytes: 0 %
Lymphocytes Relative: 14 %
Lymphs Abs: 0.8 10*3/uL (ref 0.7–4.0)
MCH: 30.9 pg (ref 26.0–34.0)
MCHC: 33.4 g/dL (ref 30.0–36.0)
MCV: 92.5 fL (ref 80.0–100.0)
Monocytes Absolute: 0.5 10*3/uL (ref 0.1–1.0)
Monocytes Relative: 8 %
Neutro Abs: 4.3 10*3/uL (ref 1.7–7.7)
Neutrophils Relative %: 74 %
Platelet Count: 276 10*3/uL (ref 150–400)
RBC: 3.85 MIL/uL — ABNORMAL LOW (ref 3.87–5.11)
RDW: 13.8 % (ref 11.5–15.5)
WBC Count: 5.8 10*3/uL (ref 4.0–10.5)
nRBC: 0 % (ref 0.0–0.2)

## 2020-03-29 LAB — CMP (CANCER CENTER ONLY)
ALT: 11 U/L (ref 0–44)
AST: 11 U/L — ABNORMAL LOW (ref 15–41)
Albumin: 3.3 g/dL — ABNORMAL LOW (ref 3.5–5.0)
Alkaline Phosphatase: 82 U/L (ref 38–126)
Anion gap: 7 (ref 5–15)
BUN: 13 mg/dL (ref 8–23)
CO2: 26 mmol/L (ref 22–32)
Calcium: 9.4 mg/dL (ref 8.9–10.3)
Chloride: 106 mmol/L (ref 98–111)
Creatinine: 1.22 mg/dL — ABNORMAL HIGH (ref 0.44–1.00)
GFR, Estimated: 49 mL/min — ABNORMAL LOW (ref >60)
Glucose, Bld: 105 mg/dL — ABNORMAL HIGH (ref 70–99)
Potassium: 3.4 mmol/L — ABNORMAL LOW (ref 3.5–5.1)
Sodium: 139 mmol/L (ref 135–145)
Total Bilirubin: 0.3 mg/dL (ref 0.3–1.2)
Total Protein: 7.4 g/dL (ref 6.5–8.1)

## 2020-03-29 LAB — TSH: TSH: 4.313 u[IU]/mL — ABNORMAL HIGH (ref 0.308–3.960)

## 2020-03-29 MED ORDER — SODIUM CHLORIDE 0.9 % IV SOLN
1500.0000 mg | Freq: Once | INTRAVENOUS | Status: AC
Start: 2020-03-29 — End: 2020-03-29
  Administered 2020-03-29: 11:00:00 1500 mg via INTRAVENOUS
  Filled 2020-03-29: qty 30

## 2020-03-29 MED ORDER — SODIUM CHLORIDE 0.9 % IV SOLN
Freq: Once | INTRAVENOUS | Status: AC
  Filled 2020-03-29: qty 250

## 2020-03-29 NOTE — Telephone Encounter (Signed)
Scheduled appt per 12/30 LOS - pt to get an updated schedule next visit.

## 2020-03-29 NOTE — Progress Notes (Signed)
[place] [phone]   Fax:([phone]) [place], [place] [address]  DIAGNOSIS: Stage IIIA (T1b, N2, M0) non-small cell lung cancer presented with right upper lobe lung nodule in addition to mediastinal lymphadenopathy diagnosed in January 2021.  PRIOR THERAPY: Concurrent chemoradiation with weekly carboplatin for AUC of 2 and paclitaxel 45 NG/M2. Status post5cycles. Last dose received on 05/30/19  CURRENT THERAPY: Consolidation immunotherapy with Imfinzi 1500 mg/kg IV every 4 weeks. First dose expected on 07/21/2019. Status post 9 cycles.   INTERVAL HISTORY: Betty Anderson 64 y.o. female returns to the clinic today for follow-up visit accompanied by her husband.  The patient continues to complain of fatigue and shortness of breath at baseline increased with exertion with mild cough.  She continues to smoke 2 to 3 packs of cigarettes every day and she is not willing to quit.  She also declined to receive her Covid vaccines.  She denied having any current chest pain or hemoptysis.  She has no nausea, vomiting, diarrhea or constipation.  She has no headache or visual changes.  She had repeat CT scan of the chest performed recently and she is here for evaluation and discussion of her risk her results.  MEDICAL HISTORY: Past Medical History:  Diagnosis Date  . COPD (chronic obstructive pulmonary disease) (HCC)    emphysema  . Hyperlipidemia   . Hypertension   . lung ca dx'd 03/2019  . Lung nodule   . Paroxysmal atrial flutter ([facility])    per cardiology note  . Peripheral artery disease ([facility])   . Tobacco abuse   . Vitamin D deficiency   . Wears glasses     ALLERGIES:  has No Known Allergies.  MEDICATIONS:  Current Outpatient Medications  Medication Sig Dispense Refill  . guaiFENesin (MUCINEX) 600 MG 12 hr tablet Take by mouth 2 (two) times daily.    Marland Kitchen albuterol (PROVENTIL) (2.5 MG/3ML)  0.083% nebulizer solution Take 3 mLs (2.5 mg total) by nebulization every 6 (six) hours as needed for wheezing or shortness of breath. 75 mL 12  . aspirin EC 81 MG tablet Take 1 tablet (81 mg total) by mouth daily. 90 tablet 3  . Cholecalciferol (DIALYVITE VITAMIN D 5000) 125 MCG (5000 UT) capsule Take 5,000 Units by mouth daily.    Marland Kitchen escitalopram (LEXAPRO) 10 MG tablet Take 10 mg by mouth daily.    . rosuvastatin (CRESTOR) 20 MG tablet Take 20 mg by mouth every evening.     . sucralfate (CARAFATE) 1 g tablet Take 1 g by mouth 4 (four) times daily.    . Tiotropium Bromide-Olodaterol (STIOLTO RESPIMAT) 2.5-2.5 MCG/ACT AERS Inhale 2 puffs into the lungs daily. 4 g 0  . vitamin B-12 (CYANOCOBALAMIN) 1000 MCG tablet Take 1,000 mcg by mouth daily.     No current facility-administered medications for this visit.    SURGICAL HISTORY:  Past Surgical History:  Procedure Laterality Date  . BRONCHIAL NEEDLE ASPIRATION BIOPSY  04/12/2019   Procedure: BRONCHIAL NEEDLE ASPIRATION BIOPSIES;  Surgeon: Garner Nash, DO;  Location: [facility];  Service: Cardiopulmonary;;  . ENDOBRONCHIAL ULTRASOUND N/A 04/12/2019   Procedure: ENDOBRONCHIAL ULTRASOUND;  Surgeon: Garner Nash, DO;  Location: [facility];  Service: Cardiopulmonary;  Laterality: N/A;  . OVARIAN CYST REMOVAL    . TONSILLECTOMY    . TUBAL LIGATION    . VIDEO BRONCHOSCOPY N/A 04/12/2019   Procedure: VIDEO BRONCHOSCOPY WITHOUT FLUORO;  Surgeon: Garner Nash, DO;  Location: [facility];  Service: Cardiopulmonary;  Laterality: N/A;    REVIEW OF SYSTEMS:  Constitutional: positive for fatigue Eyes: negative Ears, nose, mouth, throat, and face: negative Respiratory: positive for cough and dyspnea on exertion Cardiovascular: negative Gastrointestinal: negative Genitourinary:negative Integument/breast: negative Hematologic/lymphatic: negative Musculoskeletal:negative Neurological: negative Behavioral/Psych: negative Endocrine:  negative Allergic/Immunologic: negative   PHYSICAL EXAMINATION: General appearance: alert, cooperative, fatigued and no distress Head: Normocephalic, without obvious abnormality, atraumatic Neck: no adenopathy, no JVD, supple, symmetrical, trachea midline and thyroid not enlarged, symmetric, no tenderness/mass/nodules Lymph nodes: Cervical, supraclavicular, and axillary nodes normal. Resp: clear to auscultation bilaterally Back: symmetric, no curvature. ROM normal. No CVA tenderness. Cardio: regular rate and rhythm, S1, S2 normal, no murmur, click, rub or gallop GI: soft, non-tender; bowel sounds normal; no masses,  no organomegaly Extremities: extremities normal, atraumatic, no cyanosis or edema Neurologic: Alert and oriented X 3, normal strength and tone. Normal symmetric reflexes. Normal coordination and gait  ECOG PERFORMANCE STATUS: 1 - Symptomatic but completely ambulatory  Blood pressure 91/65, pulse 91, temperature (!) 97.5 F (36.4 C), temperature source Tympanic, resp. rate 17, height 5\' 2"  (1.575 m), weight 153 lb 6.4 oz (69.6 kg), SpO2 98 %.  LABORATORY DATA: Lab Results  Component Value Date   WBC 5.8 03/29/2020   HGB 11.9 (L) 03/29/2020   HCT 35.6 (L) 03/29/2020   MCV 92.5 03/29/2020   PLT 276 03/29/2020      Chemistry      Component Value Date/Time   NA 139 03/29/2020 0830   K 3.4 (L) 03/29/2020 0830   CL 106 03/29/2020 0830   CO2 26 03/29/2020 0830   BUN 13 03/29/2020 0830   CREATININE 1.22 (H) 03/29/2020 0830      Component Value Date/Time   CALCIUM 9.4 03/29/2020 0830   ALKPHOS 82 03/29/2020 0830   AST 11 (L) 03/29/2020 0830   ALT 11 03/29/2020 0830   BILITOT 0.3 03/29/2020 0830       RADIOGRAPHIC STUDIES: CT Chest W Contrast  Result Date: 03/26/2020 CLINICAL DATA:  Non-small cell lung cancer, metastatic disease with follow-up assessment EXAM: CT CHEST WITH CONTRAST TECHNIQUE: Multidetector CT imaging of the chest was performed during intravenous  contrast administration. CONTRAST:  62mL OMNIPAQUE IOHEXOL 300 MG/ML  SOLN COMPARISON:  01/02/2020 FINDINGS: Cardiovascular: Heart size is normal and stable without signs of nodular pericardial thickening with small amount of pericardial fluid that is similar to the prior study. Calcified and noncalcified atheromatous plaque in the thoracic aorta. No aneurysmal dilation. Calcified coronary artery disease as before. The central pulmonary vasculature is normal caliber. Narrowing of the LEFT subclavian artery with atherosclerotic plaque is similar to prior imaging. Mediastinum/Nodes: Ill-defined tissue planes in the posterior mediastinum about the esophagus with similar appearance. Stable subcarinal lymph node approximately 7 mm. RIGHT paratracheal lymph node on image 53 of series 2 also stable approximately 7 mm. No hilar adenopathy. No retrocrural adenopathy. No axillary lymphadenopathy no thoracic inlet lymphadenopathy. Lungs/Pleura: Tiny nodules scattered about the chest are similar to the prior study. There is however a new pulmonary nodule in the medial RIGHT upper lobe (image 45, series 7) 8 mm. Pre-existing RIGHT upper lobe pulmonary nodule with spiculated margins measuring approximately 7 x 5 mm is unchanged. Mild septal thickening and some ground-glass attenuation in the RIGHT upper lobe with similar appearance. Signs of subpleural reticulation also stable. Background pulmonary emphysema. Upper Abdomen: Incidental imaging of upper abdominal contents with normal appearance of bilateral adrenal glands.  Spleen, pancreas and visualized gastrointestinal tract that acute process. Mild cortical scarring of the visualized kidneys. No upper abdominal lymphadenopathy. Musculoskeletal: No acute musculoskeletal process. Spinal degenerative changes. IMPRESSION: 1. New 8 mm pulmonary nodule in the medial RIGHT upper lobe, suspicious for metachronous primary versus metastatic lesion. PET scan, short interval follow-up or  biopsy could all be considered given patient history for further assessment. Discussion in multi disciplinary oncologic setting may be helpful. 2. Pre-existing RIGHT upper lobe pulmonary nodule with spiculated margins is unchanged. 3. Scattered small nodules elsewhere, septal thickening and subpleural reticulation with a similar appearance to prior imaging evaluations. 4. Stable small mediastinal lymph nodes. 5. Calcified and noncalcified atheromatous plaque in the thoracic aorta. Narrowing of the LEFT subclavian artery with atherosclerotic plaque is similar to prior imaging. 6. Aortic atherosclerosis and emphysema. Aortic Atherosclerosis (ICD10-I70.0) and Emphysema (ICD10-J43.9). Electronically Signed   By: Zetta Bills M.D.   On: 03/26/2020 15:17    ASSESSMENT AND PLAN: This is a very pleasant 64 years old white female with stage IIIA non-small cell lung cancer, presented with right upper lobe lung nodule and mediastinal lymphadenopathy diagnosed in January 2021.   She completed a course of concurrent chemoradiation with weekly carboplatin and paclitaxel for 5 cycles and tolerated her treatment well.  She has partial response. The patient is currently undergoing consolidation treatment with Imfinzi 1500 mg IV every 4 weeks status post 9 cycles. The patient continues to tolerate this treatment well with no concerning adverse effects. She had repeat CT scan of the chest performed recently.  I personally and independently reviewed the scan images and discussed the results and showed the images to the patient today. Her scan showed no concerning findings for disease progression except for new 0.8 cm pulmonary nodule in the medial right upper lobe suspicious for metachronous primary versus metastatic lesion. I discussed with the patient several options for her condition including continuous observation and monitoring on the upcoming scan versus referral to radiation oncology for SBRT. The patient would like  to continue on observation for now. She will proceed with cycle #10 of her immunotherapy today as planned. I strongly encouraged the patient to quit smoking but she is not considering it. She also declined receiving her Covid vaccines. She will come back for follow-up visit in 4 weeks for evaluation before starting cycle #11. The patient was advised to call immediately if she has any concerning symptoms in the interval. The patient voices understanding of current disease status and treatment options and is in agreement with the current care plan.  All questions were answered. The patient knows to call the clinic with any problems, questions or concerns. We can certainly see the patient much sooner if necessary.   Disclaimer: This note was dictated with voice recognition software. Similar sounding words can inadvertently be transcribed and may not be corrected upon review.

## 2020-03-29 NOTE — Patient Instructions (Signed)
[facility] Discharge Instructions for Patients Receiving Chemotherapy  Today you received the following chemotherapy agents: Imfinzi.  To help prevent nausea and vomiting after your treatment, we encourage you to take your nausea medication as directed.   If you develop nausea and vomiting that is not controlled by your nausea medication, call the clinic.   BELOW ARE SYMPTOMS THAT SHOULD BE REPORTED IMMEDIATELY:  *FEVER GREATER THAN 100.5 F  *CHILLS WITH OR WITHOUT FEVER  NAUSEA AND VOMITING THAT IS NOT CONTROLLED WITH YOUR NAUSEA MEDICATION  *UNUSUAL SHORTNESS OF BREATH  *UNUSUAL BRUISING OR BLEEDING  TENDERNESS IN MOUTH AND THROAT WITH OR WITHOUT PRESENCE OF ULCERS  *URINARY PROBLEMS  *BOWEL PROBLEMS  UNUSUAL RASH Items with * indicate a potential emergency and should be followed up as soon as possible.  Feel free to call the clinic should you have any questions or concerns. The clinic phone number is [phone].  Please show the CHEMO ALERT CARD at check-in to the Emergency Department and triage nurse.   

## 2020-04-26 ENCOUNTER — Other Ambulatory Visit: Payer: Self-pay

## 2020-04-26 ENCOUNTER — Encounter: Payer: Self-pay | Admitting: Internal Medicine

## 2020-04-26 ENCOUNTER — Inpatient Hospital Stay: Payer: Medicare Other

## 2020-04-26 ENCOUNTER — Inpatient Hospital Stay: Payer: Medicare Other | Attending: Internal Medicine | Admitting: Internal Medicine

## 2020-04-26 VITALS — BP 87/59 | HR 98 | Temp 97.4°F | Resp 18 | Ht 62.0 in | Wt 154.6 lb

## 2020-04-26 VITALS — BP 88/63 | HR 89

## 2020-04-26 DIAGNOSIS — Z5112 Encounter for antineoplastic immunotherapy: Secondary | ICD-10-CM | POA: Diagnosis not present

## 2020-04-26 DIAGNOSIS — C3411 Malignant neoplasm of upper lobe, right bronchus or lung: Secondary | ICD-10-CM | POA: Insufficient documentation

## 2020-04-26 DIAGNOSIS — J439 Emphysema, unspecified: Secondary | ICD-10-CM | POA: Diagnosis not present

## 2020-04-26 DIAGNOSIS — Z923 Personal history of irradiation: Secondary | ICD-10-CM | POA: Insufficient documentation

## 2020-04-26 DIAGNOSIS — R0609 Other forms of dyspnea: Secondary | ICD-10-CM | POA: Diagnosis not present

## 2020-04-26 DIAGNOSIS — F17210 Nicotine dependence, cigarettes, uncomplicated: Secondary | ICD-10-CM | POA: Insufficient documentation

## 2020-04-26 DIAGNOSIS — Z79899 Other long term (current) drug therapy: Secondary | ICD-10-CM | POA: Insufficient documentation

## 2020-04-26 LAB — CBC WITH DIFFERENTIAL (CANCER CENTER ONLY)
Abs Immature Granulocytes: 0.02 10*3/uL (ref 0.00–0.07)
Basophils Absolute: 0.1 10*3/uL (ref 0.0–0.1)
Basophils Relative: 1 %
Eosinophils Absolute: 0.1 10*3/uL (ref 0.0–0.5)
Eosinophils Relative: 2 %
HCT: 33.8 % — ABNORMAL LOW (ref 36.0–46.0)
Hemoglobin: 11.4 g/dL — ABNORMAL LOW (ref 12.0–15.0)
Immature Granulocytes: 0 %
Lymphocytes Relative: 14 %
Lymphs Abs: 0.8 10*3/uL (ref 0.7–4.0)
MCH: 31.2 pg (ref 26.0–34.0)
MCHC: 33.7 g/dL (ref 30.0–36.0)
MCV: 92.6 fL (ref 80.0–100.0)
Monocytes Absolute: 0.4 10*3/uL (ref 0.1–1.0)
Monocytes Relative: 7 %
Neutro Abs: 4.1 10*3/uL (ref 1.7–7.7)
Neutrophils Relative %: 76 %
Platelet Count: 265 10*3/uL (ref 150–400)
RBC: 3.65 MIL/uL — ABNORMAL LOW (ref 3.87–5.11)
RDW: 14 % (ref 11.5–15.5)
WBC Count: 5.5 10*3/uL (ref 4.0–10.5)
nRBC: 0 % (ref 0.0–0.2)

## 2020-04-26 LAB — CMP (CANCER CENTER ONLY)
ALT: 7 U/L (ref 0–44)
AST: 11 U/L — ABNORMAL LOW (ref 15–41)
Albumin: 3.5 g/dL (ref 3.5–5.0)
Alkaline Phosphatase: 92 U/L (ref 38–126)
Anion gap: 9 (ref 5–15)
BUN: 13 mg/dL (ref 8–23)
CO2: 23 mmol/L (ref 22–32)
Calcium: 9.3 mg/dL (ref 8.9–10.3)
Chloride: 106 mmol/L (ref 98–111)
Creatinine: 1.34 mg/dL — ABNORMAL HIGH (ref 0.44–1.00)
GFR, Estimated: 44 mL/min — ABNORMAL LOW (ref >60)
Glucose, Bld: 100 mg/dL — ABNORMAL HIGH (ref 70–99)
Potassium: 3.7 mmol/L (ref 3.5–5.1)
Sodium: 138 mmol/L (ref 135–145)
Total Bilirubin: 0.3 mg/dL (ref 0.3–1.2)
Total Protein: 7.7 g/dL (ref 6.5–8.1)

## 2020-04-26 LAB — TSH: TSH: 1.706 u[IU]/mL (ref 0.308–3.960)

## 2020-04-26 MED ORDER — SODIUM CHLORIDE 0.9 % IV SOLN
1500.0000 mg | Freq: Once | INTRAVENOUS | Status: AC
  Administered 2020-04-26: 1500 mg via INTRAVENOUS
  Filled 2020-04-26: qty 30

## 2020-04-26 MED ORDER — SODIUM CHLORIDE 0.9% FLUSH
10.0000 mL | INTRAVENOUS | Status: DC | PRN
  Filled 2020-04-26: qty 10

## 2020-04-26 MED ORDER — HEPARIN SOD (PORK) LOCK FLUSH 100 UNIT/ML IV SOLN
500.0000 [IU] | Freq: Once | INTRAVENOUS | Status: DC | PRN
  Filled 2020-04-26: qty 5

## 2020-04-26 MED ORDER — SODIUM CHLORIDE 0.9 % IV SOLN
Freq: Once | INTRAVENOUS | Status: AC
  Filled 2020-04-26: qty 250

## 2020-04-26 NOTE — Patient Instructions (Signed)
Steps to Quit Smoking Smoking tobacco is the leading cause of preventable death. It can affect almost every organ in the body. Smoking puts you and people around you at risk for many serious, long-lasting (chronic) diseases. Quitting smoking can be hard, but it is one of the best things that you can do for your health. It is never too late to quit. How do I get ready to quit? When you decide to quit smoking, make a plan to help you succeed. Before you quit:  Pick a date to quit. Set a date within the next 2 weeks to give you time to prepare.  Write down the reasons why you are quitting. Keep this list in places where you will see it often.  Tell your family, friends, and co-workers that you are quitting. Their support is important.  Talk with your doctor about the choices that may help you quit.  Find out if your health insurance will pay for these treatments.  Know the people, places, things, and activities that make you want to smoke (triggers). Avoid them. What first steps can I take to quit smoking?  Throw away all cigarettes at home, at work, and in your car.  Throw away the things that you use when you smoke, such as ashtrays and lighters.  Clean your car. Make sure to empty the ashtray.  Clean your home, including curtains and carpets. What can I do to help me quit smoking? Talk with your doctor about taking medicines and seeing a counselor at the same time. You are more likely to succeed when you do both.  If you are pregnant or breastfeeding, talk with your doctor about counseling or other ways to quit smoking. Do not take medicine to help you quit smoking unless your doctor tells you to do so. To quit smoking: Quit right away  Quit smoking totally, instead of slowly cutting back on how much you smoke over a period of time.  Go to counseling. You are more likely to quit if you go to counseling sessions regularly. Take medicine You may take medicines to help you quit. Some  medicines need a prescription, and some you can buy over-the-counter. Some medicines may contain a drug called nicotine to replace the nicotine in cigarettes. Medicines may:  Help you to stop having the desire to smoke (cravings).  Help to stop the problems that come when you stop smoking (withdrawal symptoms). Your doctor may ask you to use:  Nicotine patches, gum, or lozenges.  Nicotine inhalers or sprays.  Non-nicotine medicine that is taken by mouth. Find resources Find resources and other ways to help you quit smoking and remain smoke-free after you quit. These resources are most helpful when you use them often. They include:  Online chats with a counselor.  Phone quitlines.  Printed self-help materials.  Support groups or group counseling.  Text messaging programs.  Mobile phone apps. Use apps on your mobile phone or tablet that can help you stick to your quit plan. There are many free apps for mobile phones and tablets as well as websites. Examples include Quit Guide from the CDC and smokefree.gov   What things can I do to make it easier to quit?  Talk to your family and friends. Ask them to support and encourage you.  Call a phone quitline (1-800-QUIT-NOW), reach out to support groups, or work with a counselor.  Ask people who smoke to not smoke around you.  Avoid places that make you want to smoke,   such as: ? Bars. ? Parties. ? Smoke-break areas at work.  Spend time with people who do not smoke.  Lower the stress in your life. Stress can make you want to smoke. Try these things to help your stress: ? Getting regular exercise. ? Doing deep-breathing exercises. ? Doing yoga. ? Meditating. ? Doing a body scan. To do this, close your eyes, focus on one area of your body at a time from head to toe. Notice which parts of your body are tense. Try to relax the muscles in those areas.   How will I feel when I quit smoking? Day 1 to 3 weeks Within the first 24 hours,  you may start to have some problems that come from quitting tobacco. These problems are very bad 2-3 days after you quit, but they do not often last for more than 2-3 weeks. You may get these symptoms:  Mood swings.  Feeling restless, nervous, angry, or annoyed.  Trouble concentrating.  Dizziness.  Strong desire for high-sugar foods and nicotine.  Weight gain.  Trouble pooping (constipation).  Feeling like you may vomit (nausea).  Coughing or a sore throat.  Changes in how the medicines that you take for other issues work in your body.  Depression.  Trouble sleeping (insomnia). Week 3 and afterward After the first 2-3 weeks of quitting, you may start to notice more positive results, such as:  Better sense of smell and taste.  Less coughing and sore throat.  Slower heart rate.  Lower blood pressure.  Clearer skin.  Better breathing.  Fewer sick days. Quitting smoking can be hard. Do not give up if you fail the first time. Some people need to try a few times before they succeed. Do your best to stick to your quit plan, and talk with your doctor if you have any questions or concerns. Summary  Smoking tobacco is the leading cause of preventable death. Quitting smoking can be hard, but it is one of the best things that you can do for your health.  When you decide to quit smoking, make a plan to help you succeed.  Quit smoking right away, not slowly over a period of time.  When you start quitting, seek help from your doctor, family, or friends. This information is not intended to replace advice given to you by your health care provider. Make sure you discuss any questions you have with your health care provider. Document Revised: 12/10/2018 Document Reviewed: 06/05/2018 Elsevier Patient Education  2021 Elsevier Inc.  

## 2020-04-26 NOTE — Progress Notes (Signed)
Per Dr Julien Nordmann, it is okay to treat pt today with Imfinzi and low BP.

## 2020-04-26 NOTE — Patient Instructions (Signed)
[facility] Discharge Instructions for Patients Receiving Chemotherapy  Today you received the following immunotherapy agent: Durvalumab (Imfinzi)  To help prevent nausea and vomiting after your treatment, we encourage you to take your nausea medication as directed by your MD.   If you develop nausea and vomiting that is not controlled by your nausea medication, call the clinic.   BELOW ARE SYMPTOMS THAT SHOULD BE REPORTED IMMEDIATELY:  *FEVER GREATER THAN 100.5 F  *CHILLS WITH OR WITHOUT FEVER  NAUSEA AND VOMITING THAT IS NOT CONTROLLED WITH YOUR NAUSEA MEDICATION  *UNUSUAL SHORTNESS OF BREATH  *UNUSUAL BRUISING OR BLEEDING  TENDERNESS IN MOUTH AND THROAT WITH OR WITHOUT PRESENCE OF ULCERS  *URINARY PROBLEMS  *BOWEL PROBLEMS  UNUSUAL RASH Items with * indicate a potential emergency and should be followed up as soon as possible.  Feel free to call the clinic should you have any questions or concerns. The clinic phone number is [phone].  Please show the [place] at check-in to the Emergency Department and triage nurse.

## 2020-04-26 NOTE — Progress Notes (Signed)
[facility] [phone]   Fax:(336) [place], MD [place] [address]  DIAGNOSIS: Stage IIIA (T1b, N2, M0) non-small cell lung cancer presented with right upper lobe lung nodule in addition to mediastinal lymphadenopathy diagnosed in January 2021.  PRIOR THERAPY: Concurrent chemoradiation with weekly carboplatin for AUC of 2 and paclitaxel 45 NG/M2. Status post5cycles. Last dose received on 05/30/19  CURRENT THERAPY: Consolidation immunotherapy with Imfinzi 1500 mg/kg IV every 4 weeks. First dose expected on 07/21/2019. Status post 10 cycles.   INTERVAL HISTORY: Betty Anderson 65 y.o. female returns to the clinic today for follow-up visit accompanied by her husband.  The patient is feeling fine today with no concerning complaints except for mild shortness of breath with exertion and cough.  She has no chest pain, or hemoptysis.  She denied having any nausea, vomiting, diarrhea or constipation.  She denied having any recent weight loss or night sweats.  She continues to tolerate her consolidation treatment with Imfinzi fairly well.  She is here today for evaluation before starting cycle #11.   MEDICAL HISTORY: Past Medical History:  Diagnosis Date  . COPD (chronic obstructive pulmonary disease) (HCC)    emphysema  . Hyperlipidemia   . Hypertension   . lung ca dx'd 03/2019  . Lung nodule   . Paroxysmal atrial flutter ([facility])    per cardiology note  . Peripheral artery disease ([facility])   . Tobacco abuse   . Vitamin D deficiency   . Wears glasses     ALLERGIES:  has No Known Allergies.  MEDICATIONS:  Current Outpatient Medications  Medication Sig Dispense Refill  . albuterol (PROVENTIL) (2.5 MG/3ML) 0.083% nebulizer solution Take 3 mLs (2.5 mg total) by nebulization every 6 (six) hours as needed for wheezing or shortness of breath. 75 mL 12  . aspirin EC 81 MG tablet Take 1 tablet (81 mg  total) by mouth daily. 90 tablet 3  . Cholecalciferol (DIALYVITE VITAMIN D 5000) 125 MCG (5000 UT) capsule Take 5,000 Units by mouth daily.    Marland Kitchen escitalopram (LEXAPRO) 10 MG tablet Take 10 mg by mouth daily.    Marland Kitchen guaiFENesin (MUCINEX) 600 MG 12 hr tablet Take by mouth 2 (two) times daily.    . rosuvastatin (CRESTOR) 20 MG tablet Take 20 mg by mouth every evening.     . sucralfate (CARAFATE) 1 g tablet Take 1 g by mouth 4 (four) times daily.    . Tiotropium Bromide-Olodaterol (STIOLTO RESPIMAT) 2.5-2.5 MCG/ACT AERS Inhale 2 puffs into the lungs daily. 4 g 0  . vitamin B-12 (CYANOCOBALAMIN) 1000 MCG tablet Take 1,000 mcg by mouth daily.     No current facility-administered medications for this visit.    SURGICAL HISTORY:  Past Surgical History:  Procedure Laterality Date  . BRONCHIAL NEEDLE ASPIRATION BIOPSY  04/12/2019   Procedure: BRONCHIAL NEEDLE ASPIRATION BIOPSIES;  Surgeon: Garner Nash, DO;  Location: [facility];  Service: Cardiopulmonary;;  . ENDOBRONCHIAL ULTRASOUND N/A 04/12/2019   Procedure: ENDOBRONCHIAL ULTRASOUND;  Surgeon: Garner Nash, DO;  Location: [facility];  Service: Cardiopulmonary;  Laterality: N/A;  . OVARIAN CYST REMOVAL    . TONSILLECTOMY    . TUBAL LIGATION    . VIDEO BRONCHOSCOPY N/A 04/12/2019   Procedure: VIDEO BRONCHOSCOPY WITHOUT FLUORO;  Surgeon: Garner Nash, DO;  Location: [facility];  Service: Cardiopulmonary;  Laterality: N/A;    REVIEW OF SYSTEMS:  A comprehensive review of  systems was negative except for: Respiratory: positive for cough and dyspnea on exertion   PHYSICAL EXAMINATION: General appearance: alert, cooperative, fatigued and no distress Head: Normocephalic, without obvious abnormality, atraumatic Neck: no adenopathy, no JVD, supple, symmetrical, trachea midline and thyroid not enlarged, symmetric, no tenderness/mass/nodules Lymph nodes: Cervical, supraclavicular, and axillary nodes normal. Resp: clear to auscultation  bilaterally Back: symmetric, no curvature. ROM normal. No CVA tenderness. Cardio: regular rate and rhythm, S1, S2 normal, no murmur, click, rub or gallop GI: soft, non-tender; bowel sounds normal; no masses,  no organomegaly Extremities: extremities normal, atraumatic, no cyanosis or edema  ECOG PERFORMANCE STATUS: 1 - Symptomatic but completely ambulatory  Blood pressure (!) 87/59, pulse 98, temperature (!) 97.4 F (36.3 C), temperature source Tympanic, resp. rate 18, height 5\' 2"  (1.575 m), weight 154 lb 9.6 oz (70.1 kg), SpO2 100 %.  LABORATORY DATA: Lab Results  Component Value Date   WBC 5.5 04/26/2020   HGB 11.4 (L) 04/26/2020   HCT 33.8 (L) 04/26/2020   MCV 92.6 04/26/2020   PLT 265 04/26/2020      Chemistry      Component Value Date/Time   NA 139 03/29/2020 0830   K 3.4 (L) 03/29/2020 0830   CL 106 03/29/2020 0830   CO2 26 03/29/2020 0830   BUN 13 03/29/2020 0830   CREATININE 1.22 (H) 03/29/2020 0830      Component Value Date/Time   CALCIUM 9.4 03/29/2020 0830   ALKPHOS 82 03/29/2020 0830   AST 11 (L) 03/29/2020 0830   ALT 11 03/29/2020 0830   BILITOT 0.3 03/29/2020 0830       RADIOGRAPHIC STUDIES: No results found.  ASSESSMENT AND PLAN: This is a very pleasant 65 years old white female with stage IIIA non-small cell lung cancer, presented with right upper lobe lung nodule and mediastinal lymphadenopathy diagnosed in January 2021.   She completed a course of concurrent chemoradiation with weekly carboplatin and paclitaxel for 5 cycles and tolerated her treatment well.  She has partial response. The patient is currently undergoing consolidation treatment with Imfinzi 1500 mg IV every 4 weeks status post 10 cycles. The patient continues to tolerate her treatment well with no concerning adverse effects. I recommended for her to proceed with cycle #11 today as planned. She will come back for follow-up visit in 4 weeks for evaluation before the next cycle of her  treatment. For smoking cessation, the patient is not interested in quitting smoking.  She is also not interested in taking the Covid vaccine. She was advised to call immediately if she has any concerning symptoms in the interval. The patient voices understanding of current disease status and treatment options and is in agreement with the current care plan.  All questions were answered. The patient knows to call the clinic with any problems, questions or concerns. We can certainly see the patient much sooner if necessary.   Disclaimer: This note was dictated with voice recognition software. Similar sounding words can inadvertently be transcribed and may not be corrected upon review.

## 2020-05-24 ENCOUNTER — Encounter: Payer: Self-pay | Admitting: Internal Medicine

## 2020-05-24 ENCOUNTER — Inpatient Hospital Stay: Payer: Medicare Other | Admitting: Internal Medicine

## 2020-05-24 ENCOUNTER — Inpatient Hospital Stay: Payer: Medicare Other

## 2020-05-24 ENCOUNTER — Inpatient Hospital Stay: Payer: Medicare Other | Attending: Internal Medicine

## 2020-05-24 ENCOUNTER — Other Ambulatory Visit: Payer: Self-pay

## 2020-05-24 VITALS — BP 86/67 | HR 86 | Temp 98.0°F | Resp 16 | Ht 62.0 in | Wt 153.4 lb

## 2020-05-24 DIAGNOSIS — C3411 Malignant neoplasm of upper lobe, right bronchus or lung: Secondary | ICD-10-CM

## 2020-05-24 DIAGNOSIS — Z923 Personal history of irradiation: Secondary | ICD-10-CM | POA: Insufficient documentation

## 2020-05-24 DIAGNOSIS — Z5112 Encounter for antineoplastic immunotherapy: Secondary | ICD-10-CM | POA: Insufficient documentation

## 2020-05-24 DIAGNOSIS — J439 Emphysema, unspecified: Secondary | ICD-10-CM | POA: Insufficient documentation

## 2020-05-24 DIAGNOSIS — Z79899 Other long term (current) drug therapy: Secondary | ICD-10-CM | POA: Diagnosis not present

## 2020-05-24 DIAGNOSIS — C771 Secondary and unspecified malignant neoplasm of intrathoracic lymph nodes: Secondary | ICD-10-CM | POA: Insufficient documentation

## 2020-05-24 DIAGNOSIS — R5383 Other fatigue: Secondary | ICD-10-CM | POA: Diagnosis not present

## 2020-05-24 DIAGNOSIS — R0609 Other forms of dyspnea: Secondary | ICD-10-CM | POA: Insufficient documentation

## 2020-05-24 LAB — CBC WITH DIFFERENTIAL (CANCER CENTER ONLY)
Abs Immature Granulocytes: 0.01 10*3/uL (ref 0.00–0.07)
Basophils Absolute: 0.1 10*3/uL (ref 0.0–0.1)
Basophils Relative: 1 %
Eosinophils Absolute: 0.2 10*3/uL (ref 0.0–0.5)
Eosinophils Relative: 3 %
HCT: 39.1 % (ref 36.0–46.0)
Hemoglobin: 12.6 g/dL (ref 12.0–15.0)
Immature Granulocytes: 0 %
Lymphocytes Relative: 12 %
Lymphs Abs: 0.7 10*3/uL (ref 0.7–4.0)
MCH: 29.9 pg (ref 26.0–34.0)
MCHC: 32.2 g/dL (ref 30.0–36.0)
MCV: 92.9 fL (ref 80.0–100.0)
Monocytes Absolute: 0.4 10*3/uL (ref 0.1–1.0)
Monocytes Relative: 8 %
Neutro Abs: 4.2 10*3/uL (ref 1.7–7.7)
Neutrophils Relative %: 76 %
Platelet Count: 296 10*3/uL (ref 150–400)
RBC: 4.21 MIL/uL (ref 3.87–5.11)
RDW: 14 % (ref 11.5–15.5)
WBC Count: 5.5 10*3/uL (ref 4.0–10.5)
nRBC: 0 % (ref 0.0–0.2)

## 2020-05-24 LAB — CMP (CANCER CENTER ONLY)
ALT: 10 U/L (ref 0–44)
AST: 14 U/L — ABNORMAL LOW (ref 15–41)
Albumin: 3.6 g/dL (ref 3.5–5.0)
Alkaline Phosphatase: 92 U/L (ref 38–126)
Anion gap: 8 (ref 5–15)
BUN: 10 mg/dL (ref 8–23)
CO2: 22 mmol/L (ref 22–32)
Calcium: 9.4 mg/dL (ref 8.9–10.3)
Chloride: 107 mmol/L (ref 98–111)
Creatinine: 1.29 mg/dL — ABNORMAL HIGH (ref 0.44–1.00)
GFR, Estimated: 46 mL/min — ABNORMAL LOW (ref >60)
Glucose, Bld: 88 mg/dL (ref 70–99)
Potassium: 4.1 mmol/L (ref 3.5–5.1)
Sodium: 137 mmol/L (ref 135–145)
Total Bilirubin: 0.3 mg/dL (ref 0.3–1.2)
Total Protein: 7.8 g/dL (ref 6.5–8.1)

## 2020-05-24 LAB — TSH: TSH: 2.12 u[IU]/mL (ref 0.308–3.960)

## 2020-05-24 MED ORDER — SODIUM CHLORIDE 0.9 % IV SOLN
1500.0000 mg | Freq: Once | INTRAVENOUS | Status: AC
  Administered 2020-05-24: 1500 mg via INTRAVENOUS
  Filled 2020-05-24: qty 30

## 2020-05-24 MED ORDER — SODIUM CHLORIDE 0.9 % IV SOLN
Freq: Once | INTRAVENOUS | Status: AC
  Filled 2020-05-24: qty 250

## 2020-05-24 NOTE — Patient Instructions (Signed)
Steps to Quit Smoking Smoking tobacco is the leading cause of preventable death. It can affect almost every organ in the body. Smoking puts you and people around you at risk for many serious, long-lasting (chronic) diseases. Quitting smoking can be hard, but it is one of the best things that you can do for your health. It is never too late to quit. How do I get ready to quit? When you decide to quit smoking, make a plan to help you succeed. Before you quit:  Pick a date to quit. Set a date within the next 2 weeks to give you time to prepare.  Write down the reasons why you are quitting. Keep this list in places where you will see it often.  Tell your family, friends, and co-workers that you are quitting. Their support is important.  Talk with your doctor about the choices that may help you quit.  Find out if your health insurance will pay for these treatments.  Know the people, places, things, and activities that make you want to smoke (triggers). Avoid them. What first steps can I take to quit smoking?  Throw away all cigarettes at home, at work, and in your car.  Throw away the things that you use when you smoke, such as ashtrays and lighters.  Clean your car. Make sure to empty the ashtray.  Clean your home, including curtains and carpets. What can I do to help me quit smoking? Talk with your doctor about taking medicines and seeing a counselor at the same time. You are more likely to succeed when you do both.  If you are pregnant or breastfeeding, talk with your doctor about counseling or other ways to quit smoking. Do not take medicine to help you quit smoking unless your doctor tells you to do so. To quit smoking: Quit right away  Quit smoking totally, instead of slowly cutting back on how much you smoke over a period of time.  Go to counseling. You are more likely to quit if you go to counseling sessions regularly. Take medicine You may take medicines to help you quit. Some  medicines need a prescription, and some you can buy over-the-counter. Some medicines may contain a drug called nicotine to replace the nicotine in cigarettes. Medicines may:  Help you to stop having the desire to smoke (cravings).  Help to stop the problems that come when you stop smoking (withdrawal symptoms). Your doctor may ask you to use:  Nicotine patches, gum, or lozenges.  Nicotine inhalers or sprays.  Non-nicotine medicine that is taken by mouth. Find resources Find resources and other ways to help you quit smoking and remain smoke-free after you quit. These resources are most helpful when you use them often. They include:  Online chats with a counselor.  Phone quitlines.  Printed self-help materials.  Support groups or group counseling.  Text messaging programs.  Mobile phone apps. Use apps on your mobile phone or tablet that can help you stick to your quit plan. There are many free apps for mobile phones and tablets as well as websites. Examples include Quit Guide from the CDC and smokefree.gov   What things can I do to make it easier to quit?  Talk to your family and friends. Ask them to support and encourage you.  Call a phone quitline (1-800-QUIT-NOW), reach out to support groups, or work with a counselor.  Ask people who smoke to not smoke around you.  Avoid places that make you want to smoke,   such as: ? Bars. ? Parties. ? Smoke-break areas at work.  Spend time with people who do not smoke.  Lower the stress in your life. Stress can make you want to smoke. Try these things to help your stress: ? Getting regular exercise. ? Doing deep-breathing exercises. ? Doing yoga. ? Meditating. ? Doing a body scan. To do this, close your eyes, focus on one area of your body at a time from head to toe. Notice which parts of your body are tense. Try to relax the muscles in those areas.   How will I feel when I quit smoking? Day 1 to 3 weeks Within the first 24 hours,  you may start to have some problems that come from quitting tobacco. These problems are very bad 2-3 days after you quit, but they do not often last for more than 2-3 weeks. You may get these symptoms:  Mood swings.  Feeling restless, nervous, angry, or annoyed.  Trouble concentrating.  Dizziness.  Strong desire for high-sugar foods and nicotine.  Weight gain.  Trouble pooping (constipation).  Feeling like you may vomit (nausea).  Coughing or a sore throat.  Changes in how the medicines that you take for other issues work in your body.  Depression.  Trouble sleeping (insomnia). Week 3 and afterward After the first 2-3 weeks of quitting, you may start to notice more positive results, such as:  Better sense of smell and taste.  Less coughing and sore throat.  Slower heart rate.  Lower blood pressure.  Clearer skin.  Better breathing.  Fewer sick days. Quitting smoking can be hard. Do not give up if you fail the first time. Some people need to try a few times before they succeed. Do your best to stick to your quit plan, and talk with your doctor if you have any questions or concerns. Summary  Smoking tobacco is the leading cause of preventable death. Quitting smoking can be hard, but it is one of the best things that you can do for your health.  When you decide to quit smoking, make a plan to help you succeed.  Quit smoking right away, not slowly over a period of time.  When you start quitting, seek help from your doctor, family, or friends. This information is not intended to replace advice given to you by your health care provider. Make sure you discuss any questions you have with your health care provider. Document Revised: 12/10/2018 Document Reviewed: 06/05/2018 Elsevier Patient Education  2021 Elsevier Inc.  

## 2020-05-24 NOTE — Progress Notes (Signed)
[facility] [phone]   Fax:(336) [place], MD [place] [address]  DIAGNOSIS: Stage IIIA (T1b, N2, M0) non-small cell lung cancer presented with right upper lobe lung nodule in addition to mediastinal lymphadenopathy diagnosed in January 2021.  PRIOR THERAPY: Concurrent chemoradiation with weekly carboplatin for AUC of 2 and paclitaxel 45 NG/M2. Status post5cycles. Last dose received on 05/30/19  CURRENT THERAPY: Consolidation immunotherapy with Imfinzi 1500 mg/kg IV every 4 weeks. First dose expected on 07/21/2019. Status post 11 cycles.   INTERVAL HISTORY: Betty Anderson 65 y.o. female returns to the clinic today for follow-up visit accompanied by her husband.  The patient is feeling fine today with no concerning complaints except for lack of energy.  She denied having any chest pain, shortness of breath except with exertion with mild cough and no hemoptysis.  She continues to smoke at regular basis.  She denied having any nausea, vomiting, diarrhea or constipation.  She denied having any fever or chills.  She has no headache or visual changes.  She is here today for evaluation before starting cycle #12 of her treatment.   MEDICAL HISTORY: Past Medical History:  Diagnosis Date  . COPD (chronic obstructive pulmonary disease) (HCC)    emphysema  . Hyperlipidemia   . Hypertension   . lung ca dx'd 03/2019  . Lung nodule   . Paroxysmal atrial flutter ([facility])    per cardiology note  . Peripheral artery disease ([facility])   . Tobacco abuse   . Vitamin D deficiency   . Wears glasses     ALLERGIES:  has No Known Allergies.  MEDICATIONS:  Current Outpatient Medications  Medication Sig Dispense Refill  . albuterol (PROVENTIL) (2.5 MG/3ML) 0.083% nebulizer solution Take 3 mLs (2.5 mg total) by nebulization every 6 (six) hours as needed for wheezing or shortness of breath. 75 mL 12  . aspirin EC 81  MG tablet Take 1 tablet (81 mg total) by mouth daily. 90 tablet 3  . Cholecalciferol (DIALYVITE VITAMIN D 5000) 125 MCG (5000 UT) capsule Take 5,000 Units by mouth daily.    Marland Kitchen escitalopram (LEXAPRO) 10 MG tablet Take 10 mg by mouth daily.    Marland Kitchen guaiFENesin (MUCINEX) 600 MG 12 hr tablet Take by mouth 2 (two) times daily.    . rosuvastatin (CRESTOR) 20 MG tablet Take 20 mg by mouth every evening.     . sucralfate (CARAFATE) 1 g tablet Take 1 g by mouth 4 (four) times daily.    . Tiotropium Bromide-Olodaterol (STIOLTO RESPIMAT) 2.5-2.5 MCG/ACT AERS Inhale 2 puffs into the lungs daily. 4 g 0  . vitamin B-12 (CYANOCOBALAMIN) 1000 MCG tablet Take 1,000 mcg by mouth daily.     No current facility-administered medications for this visit.    SURGICAL HISTORY:  Past Surgical History:  Procedure Laterality Date  . BRONCHIAL NEEDLE ASPIRATION BIOPSY  04/12/2019   Procedure: BRONCHIAL NEEDLE ASPIRATION BIOPSIES;  Surgeon: Garner Nash, DO;  Location: [facility];  Service: Cardiopulmonary;;  . ENDOBRONCHIAL ULTRASOUND N/A 04/12/2019   Procedure: ENDOBRONCHIAL ULTRASOUND;  Surgeon: Garner Nash, DO;  Location: [facility];  Service: Cardiopulmonary;  Laterality: N/A;  . OVARIAN CYST REMOVAL    . TONSILLECTOMY    . TUBAL LIGATION    . VIDEO BRONCHOSCOPY N/A 04/12/2019   Procedure: VIDEO BRONCHOSCOPY WITHOUT FLUORO;  Surgeon: Garner Nash, DO;  Location: [facility];  Service: Cardiopulmonary;  Laterality: N/A;  REVIEW OF SYSTEMS:  A comprehensive review of systems was negative except for: Constitutional: positive for fatigue Respiratory: positive for cough and dyspnea on exertion   PHYSICAL EXAMINATION: General appearance: alert, cooperative, fatigued and no distress Head: Normocephalic, without obvious abnormality, atraumatic Neck: no adenopathy, no JVD, supple, symmetrical, trachea midline and thyroid not enlarged, symmetric, no tenderness/mass/nodules Lymph nodes: Cervical,  supraclavicular, and axillary nodes normal. Resp: clear to auscultation bilaterally Back: symmetric, no curvature. ROM normal. No CVA tenderness. Cardio: regular rate and rhythm, S1, S2 normal, no murmur, click, rub or gallop GI: soft, non-tender; bowel sounds normal; no masses,  no organomegaly Extremities: extremities normal, atraumatic, no cyanosis or edema  ECOG PERFORMANCE STATUS: 1 - Symptomatic but completely ambulatory  Blood pressure (!) 86/67, pulse 86, temperature 98 F (36.7 C), temperature source Tympanic, resp. rate 16, height 5\' 2"  (1.575 m), weight 153 lb 6.4 oz (69.6 kg), SpO2 98 %.  LABORATORY DATA: Lab Results  Component Value Date   WBC 5.5 05/24/2020   HGB 12.6 05/24/2020   HCT 39.1 05/24/2020   MCV 92.9 05/24/2020   PLT 296 05/24/2020      Chemistry      Component Value Date/Time   NA 138 04/26/2020 0821   K 3.7 04/26/2020 0821   CL 106 04/26/2020 0821   CO2 23 04/26/2020 0821   BUN 13 04/26/2020 0821   CREATININE 1.34 (H) 04/26/2020 0821      Component Value Date/Time   CALCIUM 9.3 04/26/2020 0821   ALKPHOS 92 04/26/2020 0821   AST 11 (L) 04/26/2020 0821   ALT 7 04/26/2020 0821   BILITOT 0.3 04/26/2020 0821       RADIOGRAPHIC STUDIES: No results found.  ASSESSMENT AND PLAN: This is a very pleasant 65 years old white female with stage IIIA non-small cell lung cancer, presented with right upper lobe lung nodule and mediastinal lymphadenopathy diagnosed in January 2021.   She completed a course of concurrent chemoradiation with weekly carboplatin and paclitaxel for 5 cycles and tolerated her treatment well.  She has partial response. The patient is currently undergoing consolidation treatment with Imfinzi 1500 mg IV every 4 weeks status post 11 cycles. The patient continues to tolerate her treatment fairly well with no concerning adverse effects except for lack of energy. I recommended for her to proceed with cycle #12 today as planned. She will  come back for follow-up visit in 4 weeks for evaluation before the next cycle of her treatment. For smoking cessation, the patient is not interested in quitting smoking.  She is also not interested in taking the Covid vaccine. The patient was advised to call immediately if she has any concerning symptoms in the interval. The patient voices understanding of current disease status and treatment options and is in agreement with the current care plan.  All questions were answered. The patient knows to call the clinic with any problems, questions or concerns. We can certainly see the patient much sooner if necessary.   Disclaimer: This note was dictated with voice recognition software. Similar sounding words can inadvertently be transcribed and may not be corrected upon review.

## 2020-05-24 NOTE — Patient Instructions (Signed)
[facility] Discharge Instructions for Patients Receiving Chemotherapy  Today you received the following immunotherapy agent: Durvalumab (Imfinzi)  To help prevent nausea and vomiting after your treatment, we encourage you to take your nausea medication as directed by your MD.   If you develop nausea and vomiting that is not controlled by your nausea medication, call the clinic.   BELOW ARE SYMPTOMS THAT SHOULD BE REPORTED IMMEDIATELY:  *FEVER GREATER THAN 100.5 F  *CHILLS WITH OR WITHOUT FEVER  NAUSEA AND VOMITING THAT IS NOT CONTROLLED WITH YOUR NAUSEA MEDICATION  *UNUSUAL SHORTNESS OF BREATH  *UNUSUAL BRUISING OR BLEEDING  TENDERNESS IN MOUTH AND THROAT WITH OR WITHOUT PRESENCE OF ULCERS  *URINARY PROBLEMS  *BOWEL PROBLEMS  UNUSUAL RASH Items with * indicate a potential emergency and should be followed up as soon as possible.  Feel free to call the clinic should you have any questions or concerns. The clinic phone number is [phone].  Please show the [facility] at check-in to the Emergency Department and triage nurse.

## 2020-05-25 ENCOUNTER — Telehealth: Payer: Self-pay | Admitting: Internal Medicine

## 2020-05-25 NOTE — Telephone Encounter (Signed)
Scheduled appts per 2/24 los. Pt to get updated appt calendar at next visit per appt notes.

## 2020-06-05 ENCOUNTER — Other Ambulatory Visit: Payer: Self-pay

## 2020-06-05 ENCOUNTER — Ambulatory Visit: Payer: Medicare Other | Admitting: Gastroenterology

## 2020-06-05 ENCOUNTER — Encounter: Payer: Self-pay | Admitting: Gastroenterology

## 2020-06-05 VITALS — BP 118/70 | HR 74 | Ht 62.0 in | Wt 154.0 lb

## 2020-06-05 DIAGNOSIS — R1310 Dysphagia, unspecified: Secondary | ICD-10-CM | POA: Diagnosis not present

## 2020-06-05 MED ORDER — PANTOPRAZOLE SODIUM 40 MG PO TBEC: 40.0000 mg | DELAYED_RELEASE_TABLET | Freq: Two times a day (BID) | ORAL | 3 refills | Status: DC

## 2020-06-05 MED ORDER — SUCRALFATE 1 G PO TABS: 1.0000 g | ORAL_TABLET | Freq: Four times a day (QID) | ORAL | 2 refills | Status: DC

## 2020-06-05 NOTE — Patient Instructions (Addendum)
It was a pleasure to meet you today. Based on our discussion, I am providing you with my recommendations below:  RECOMMENDATION(S):   I am recommending that you continue Carafate and to start Pantoprazole as written below. To better evaluate your symptoms, I am recommending an endoscopy.  PRESCRIPTION MEDICATION(S):   We have sent the following medication(s) to your pharmacy:  . Pantoprazole - please take 40mg  by mouth 2 times daily . Carafate - please continue to take 1g by mouth 4 times daily  NOTE: If your medication(s) requires a PRIOR AUTHORIZATION, we will receive notification from your pharmacy. Once received, the process to submit for approval may take up to 7-10 business days. You will be contacted about any denials we have received from your insurance company as well as alternatives recommended by your provider.  ENDOSCOPY:   . You have been scheduled for an endoscopy. Please follow written instructions given to you at your visit today.  INHALERS:   . If you use inhalers (even only as needed), please bring them with you on the day of your procedure.  BMI:  . If you are age 47 or older, your body mass index should be between 23-30. Your There is no height or weight on file to calculate BMI. If this is out of the aforementioned range listed, please consider follow up with your Primary Care Provider.  Thank you for trusting me with your gastrointestinal care!    Th[place]ton [place], MD, MPH

## 2020-06-05 NOTE — Progress Notes (Signed)
Referring Provider: [name], MD Primary Care Physician:  [name], MD  Reason for Consultation:  Dysphagia   IMPRESSION:  Dysphagia in the setting of non-small cell lung cancer treated with chemoradiation  No evidence for cervical lymphadenopathy. Could be mediastinal disease, brainstem lesions, mets to the esophagus, second primary, radiation-related stricture, or even candideal esophagitis.  PLAN: Start pantoprazole 40 mg BID Continue Carafate EGD with possible biopsy  Please see the "Patient Instructions" section for addition details about the plan.  HPI: Betty Anderson is a 65 y.o. female referred by Dr. Joylene Draft for dysphagia. The history is obtained through the patient, review of her electronic health record, and her husband who accompanies her to this appointment.   She has stage IIIA non-small cell lung cancer, presented with right upper lobe lung nodule and mediastinal lymphadenopathy, diagnosed in January 2021. Completed a course of concurrent chemoradiation with weekly carboplatin and paclitaxel for 5 cycles 3/21.  Currently receiving consolidation immunotherapy with Imfinzi every 4 weeks. She continues to smoke.   Reports odynophagia and intermittent dysphagia that started during radiation. Had improved, but, worsened over the last few months. Occasionally feels like she is swallowing glass. Food bolus at the sternal notch and will slowly ease down. No change with eating small bites.  No heartburn, dysphonia, neck pain, sore throat, change in bowel habits.   No improvement with Carafate.   PET CT 04/14/19: upper lobe nodule right paratracheal, right hilar, and subcarinal adenopathy Chest CT 03/26/20: New pulmonary nodule, unchaged right pulmonary nodules, stable mediastinal LAD.  No prior colonoscopy. She had the colonoscopy last year. She does not want to have a colonoscopy.  No known family history of colon cancer or polyps. No family history of  uterine/endometrial cancer, pancreatic cancer or gastric/stomach cancer.   Past Medical History:  Diagnosis Date  . COPD (chronic obstructive pulmonary disease) (HCC)    emphysema  . Hyperlipidemia   . Hypertension   . lung ca dx'd 03/2019  . Lung nodule   . Paroxysmal atrial flutter ([facility])    per cardiology note  . Peripheral artery disease ([facility])   . Tobacco abuse   . Vitamin D deficiency   . Wears glasses     Past Surgical History:  Procedure Laterality Date  . BRONCHIAL NEEDLE ASPIRATION BIOPSY  04/12/2019   Procedure: BRONCHIAL NEEDLE ASPIRATION BIOPSIES;  Surgeon: Garner Nash, DO;  Location: [facility];  Service: Cardiopulmonary;;  . ENDOBRONCHIAL ULTRASOUND N/A 04/12/2019   Procedure: ENDOBRONCHIAL ULTRASOUND;  Surgeon: Garner Nash, DO;  Location: [facility];  Service: Cardiopulmonary;  Laterality: N/A;  . OVARIAN CYST REMOVAL    . TONSILLECTOMY    . TUBAL LIGATION    . VIDEO BRONCHOSCOPY N/A 04/12/2019   Procedure: VIDEO BRONCHOSCOPY WITHOUT FLUORO;  Surgeon: Garner Nash, DO;  Location: [facility];  Service: Cardiopulmonary;  Laterality: N/A;    Current Outpatient Medications  Medication Sig Dispense Refill  . albuterol (PROVENTIL) (2.5 MG/3ML) 0.083% nebulizer solution Take 3 mLs (2.5 mg total) by nebulization every 6 (six) hours as needed for wheezing or shortness of breath. 75 mL 12  . aspirin EC 81 MG tablet Take 1 tablet (81 mg total) by mouth daily. 90 tablet 3  . Cholecalciferol (DIALYVITE VITAMIN D 5000) 125 MCG (5000 UT) capsule Take 5,000 Units by mouth daily.    Marland Kitchen escitalopram (LEXAPRO) 10 MG tablet Take 10 mg by mouth daily.    Marland Kitchen guaiFENesin (MUCINEX) 600 MG 12 hr tablet Take by mouth  2 (two) times daily.    . rosuvastatin (CRESTOR) 20 MG tablet Take 20 mg by mouth every evening.     . sucralfate (CARAFATE) 1 g tablet Take 1 g by mouth 4 (four) times daily.    . Tiotropium Bromide-Olodaterol (STIOLTO RESPIMAT) 2.5-2.5 MCG/ACT AERS Inhale 2  puffs into the lungs daily. 4 g 0  . vitamin B-12 (CYANOCOBALAMIN) 1000 MCG tablet Take 1,000 mcg by mouth daily.     No current facility-administered medications for this visit.    Allergies as of 06/05/2020  . (No Known Allergies)    Family History  Problem Relation Age of Onset  . Stroke Mother   . Stroke Father   . Lung cancer Father   . Mesothelioma Brother   . Lung cancer Sister   . Colon cancer Sister   . Anuerysm Brother      Review of Systems: 12 system ROS is negative except as noted above.   Physical Exam: General:   Alert,  well-nourished, pleasant and cooperative in NAD Head:  Normocephalic and atraumatic. Eyes:  Sclera clear, no icterus.   Conjunctiva pink. Ears:  Normal auditory acuity. Nose:  No deformity, discharge,  or lesions. Mouth:  No deformity or lesions.   Neck:  Supple; no masses or thyromegaly. Lungs:  Clear throughout to auscultation.   No wheezes. Heart:  Regular rate and rhythm; no murmurs. Abdomen:  Soft, nontender, nondistended, normal bowel sounds, no rebound or guarding. No hepatosplenomegaly.   Rectal:  Deferred  Msk:  Symmetrical. No boney deformities LAD: No inguinal or umbilical LAD Extremities:  No clubbing or edema. Neurologic:  Alert and  oriented x4;  grossly nonfocal Skin:  Intact without significant lesions or rashes. Psych:  Alert and cooperative. Normal mood and affect.    Kimberly L. Tarri Glenn, MD, MPH 06/05/2020, 1:48 PM

## 2020-06-07 ENCOUNTER — Encounter: Payer: Self-pay | Admitting: Gastroenterology

## 2020-06-18 ENCOUNTER — Telehealth: Payer: Self-pay | Admitting: *Deleted

## 2020-06-18 ENCOUNTER — Encounter: Payer: Medicare Other | Admitting: Gastroenterology

## 2020-06-18 NOTE — Telephone Encounter (Signed)
Call patient to inform her that I have placed new instructions in the mail for her rescheduled procedure for 06/26/20,instructed her to call back if she has any questions,she verbalized understanding.

## 2020-06-19 ENCOUNTER — Other Ambulatory Visit: Payer: Self-pay | Admitting: Medical Oncology

## 2020-06-19 DIAGNOSIS — C3411 Malignant neoplasm of upper lobe, right bronchus or lung: Secondary | ICD-10-CM

## 2020-06-21 ENCOUNTER — Encounter: Payer: Self-pay | Admitting: Internal Medicine

## 2020-06-21 ENCOUNTER — Other Ambulatory Visit: Payer: Self-pay | Admitting: Internal Medicine

## 2020-06-21 ENCOUNTER — Inpatient Hospital Stay: Payer: Medicare Other

## 2020-06-21 ENCOUNTER — Inpatient Hospital Stay: Payer: Medicare Other | Attending: Internal Medicine

## 2020-06-21 ENCOUNTER — Other Ambulatory Visit: Payer: Self-pay

## 2020-06-21 ENCOUNTER — Inpatient Hospital Stay (HOSPITAL_BASED_OUTPATIENT_CLINIC_OR_DEPARTMENT_OTHER): Payer: Medicare Other | Admitting: Internal Medicine

## 2020-06-21 VITALS — BP 100/72 | HR 85 | Temp 97.8°F | Resp 16 | Ht 62.0 in | Wt 157.2 lb

## 2020-06-21 DIAGNOSIS — F17210 Nicotine dependence, cigarettes, uncomplicated: Secondary | ICD-10-CM | POA: Diagnosis not present

## 2020-06-21 DIAGNOSIS — C3490 Malignant neoplasm of unspecified part of unspecified bronchus or lung: Secondary | ICD-10-CM | POA: Diagnosis not present

## 2020-06-21 DIAGNOSIS — C3411 Malignant neoplasm of upper lobe, right bronchus or lung: Secondary | ICD-10-CM

## 2020-06-21 DIAGNOSIS — Z923 Personal history of irradiation: Secondary | ICD-10-CM | POA: Insufficient documentation

## 2020-06-21 DIAGNOSIS — R590 Localized enlarged lymph nodes: Secondary | ICD-10-CM | POA: Diagnosis not present

## 2020-06-21 DIAGNOSIS — Z5112 Encounter for antineoplastic immunotherapy: Secondary | ICD-10-CM

## 2020-06-21 DIAGNOSIS — Z79899 Other long term (current) drug therapy: Secondary | ICD-10-CM | POA: Diagnosis not present

## 2020-06-21 LAB — CMP (CANCER CENTER ONLY)
ALT: 9 U/L (ref 0–44)
AST: 14 U/L — ABNORMAL LOW (ref 15–41)
Albumin: 3.5 g/dL (ref 3.5–5.0)
Alkaline Phosphatase: 84 U/L (ref 38–126)
Anion gap: 10 (ref 5–15)
BUN: 13 mg/dL (ref 8–23)
CO2: 22 mmol/L (ref 22–32)
Calcium: 9.1 mg/dL (ref 8.9–10.3)
Chloride: 106 mmol/L (ref 98–111)
Creatinine: 1.27 mg/dL — ABNORMAL HIGH (ref 0.44–1.00)
GFR, Estimated: 47 mL/min — ABNORMAL LOW (ref >60)
Glucose, Bld: 75 mg/dL (ref 70–99)
Potassium: 4 mmol/L (ref 3.5–5.1)
Sodium: 138 mmol/L (ref 135–145)
Total Bilirubin: 0.3 mg/dL (ref 0.3–1.2)
Total Protein: 7.5 g/dL (ref 6.5–8.1)

## 2020-06-21 LAB — CBC WITH DIFFERENTIAL (CANCER CENTER ONLY)
Abs Immature Granulocytes: 0.01 10*3/uL (ref 0.00–0.07)
Basophils Absolute: 0.1 10*3/uL (ref 0.0–0.1)
Basophils Relative: 1 %
Eosinophils Absolute: 0.2 10*3/uL (ref 0.0–0.5)
Eosinophils Relative: 3 %
HCT: 35.9 % — ABNORMAL LOW (ref 36.0–46.0)
Hemoglobin: 11.9 g/dL — ABNORMAL LOW (ref 12.0–15.0)
Immature Granulocytes: 0 %
Lymphocytes Relative: 15 %
Lymphs Abs: 0.9 10*3/uL (ref 0.7–4.0)
MCH: 30.7 pg (ref 26.0–34.0)
MCHC: 33.1 g/dL (ref 30.0–36.0)
MCV: 92.5 fL (ref 80.0–100.0)
Monocytes Absolute: 0.5 10*3/uL (ref 0.1–1.0)
Monocytes Relative: 9 %
Neutro Abs: 4 10*3/uL (ref 1.7–7.7)
Neutrophils Relative %: 72 %
Platelet Count: 274 10*3/uL (ref 150–400)
RBC: 3.88 MIL/uL (ref 3.87–5.11)
RDW: 14.2 % (ref 11.5–15.5)
WBC Count: 5.6 10*3/uL (ref 4.0–10.5)
nRBC: 0 % (ref 0.0–0.2)

## 2020-06-21 LAB — TSH: TSH: 3.382 u[IU]/mL (ref 0.308–3.960)

## 2020-06-21 MED ORDER — SODIUM CHLORIDE 0.9 % IV SOLN
Freq: Once | INTRAVENOUS | Status: AC
  Filled 2020-06-21: qty 250

## 2020-06-21 MED ORDER — SODIUM CHLORIDE 0.9 % IV SOLN
1500.0000 mg | Freq: Once | INTRAVENOUS | Status: AC
  Administered 2020-06-21: 1500 mg via INTRAVENOUS
  Filled 2020-06-21: qty 30

## 2020-06-21 NOTE — Progress Notes (Signed)
[facility] [phone]   Fax:([phone]) [place], [place] [address]  DIAGNOSIS: Stage IIIA (T1b, N2, M0) non-small cell lung cancer presented with right upper lobe lung nodule in addition to mediastinal lymphadenopathy diagnosed in January 2021.  PRIOR THERAPY: Concurrent chemoradiation with weekly carboplatin for AUC of 2 and paclitaxel 45 NG/M2. Status post5cycles. Last dose received on 05/30/19  CURRENT THERAPY: Consolidation immunotherapy with Imfinzi 1500 mg/kg IV every 4 weeks. First dose expected on 07/21/2019. Status post 12 cycles.   INTERVAL HISTORY: Betty Anderson 65 y.o. female returns to the clinic today for follow-up visit accompanied by her husband.  The patient has no complaints today except for the smoking cough.  She denied having any chest pain but has shortness of breath with exertion and no hemoptysis.  She has no nausea, vomiting, diarrhea or constipation.  She has no significant weight loss or night sweats.  She has no headache or visual changes.  She continues to tolerate her treatment with Imfinzi fairly well.  The patient is here today for evaluation before restarting the last cycle of her treatment.   MEDICAL HISTORY: Past Medical History:  Diagnosis Date  . COPD (chronic obstructive pulmonary disease) (HCC)    emphysema  . Hyperlipidemia   . Hypertension   . lung ca dx'd 03/2019  . Lung nodule   . Paroxysmal atrial flutter ([facility])    per cardiology note  . Peripheral artery disease ([facility])   . Tobacco abuse   . Vitamin D deficiency   . Wears glasses     ALLERGIES:  has No Known Allergies.  MEDICATIONS:  Current Outpatient Medications  Medication Sig Dispense Refill  . aspirin EC 81 MG tablet Take 1 tablet (81 mg total) by mouth daily. 90 tablet 3  . Cholecalciferol (DIALYVITE VITAMIN D 5000) 125 MCG (5000 UT) capsule Take 5,000 Units by mouth daily.    Marland Kitchen  escitalopram (LEXAPRO) 10 MG tablet Take 10 mg by mouth daily.    Marland Kitchen guaiFENesin (MUCINEX) 600 MG 12 hr tablet Take by mouth 2 (two) times daily.    . pantoprazole (PROTONIX) 40 MG tablet Take 1 tablet (40 mg total) by mouth 2 (two) times daily. 180 tablet 3  . rosuvastatin (CRESTOR) 20 MG tablet Take 20 mg by mouth every evening.     . vitamin B-12 (CYANOCOBALAMIN) 1000 MCG tablet Take 1,000 mcg by mouth daily.    Marland Kitchen albuterol (PROVENTIL) (2.5 MG/3ML) 0.083% nebulizer solution Take 3 mLs (2.5 mg total) by nebulization every 6 (six) hours as needed for wheezing or shortness of breath. (Patient not taking: Reported on 06/21/2020) 75 mL 12  . Tiotropium Bromide-Olodaterol (STIOLTO RESPIMAT) 2.5-2.5 MCG/ACT AERS Inhale 2 puffs into the lungs daily. (Patient not taking: Reported on 06/21/2020) 4 g 0   No current facility-administered medications for this visit.    SURGICAL HISTORY:  Past Surgical History:  Procedure Laterality Date  . BRONCHIAL NEEDLE ASPIRATION BIOPSY  04/12/2019   Procedure: BRONCHIAL NEEDLE ASPIRATION BIOPSIES;  Surgeon: Garner Nash, DO;  Location: [facility];  Service: Cardiopulmonary;;  . ENDOBRONCHIAL ULTRASOUND N/A 04/12/2019   Procedure: ENDOBRONCHIAL ULTRASOUND;  Surgeon: Garner Nash, DO;  Location: [facility];  Service: Cardiopulmonary;  Laterality: N/A;  . OVARIAN CYST REMOVAL    . TONSILLECTOMY    . TUBAL LIGATION    . VIDEO BRONCHOSCOPY N/A 04/12/2019   Procedure: VIDEO BRONCHOSCOPY WITHOUT FLUORO;  Surgeon: Garner Nash, DO;  Location: [facility];  Service: Cardiopulmonary;  Laterality: N/A;    REVIEW OF SYSTEMS:  A comprehensive review of systems was negative except for: Respiratory: positive for cough and dyspnea on exertion   PHYSICAL EXAMINATION: General appearance: alert, cooperative and no distress Head: Normocephalic, without obvious abnormality, atraumatic Neck: no adenopathy, no JVD, supple, symmetrical, trachea midline and thyroid not  enlarged, symmetric, no tenderness/mass/nodules Lymph nodes: Cervical, supraclavicular, and axillary nodes normal. Resp: clear to auscultation bilaterally Back: symmetric, no curvature. ROM normal. No CVA tenderness. Cardio: regular rate and rhythm, S1, S2 normal, no murmur, click, rub or gallop GI: soft, non-tender; bowel sounds normal; no masses,  no organomegaly Extremities: extremities normal, atraumatic, no cyanosis or edema  ECOG PERFORMANCE STATUS: 1 - Symptomatic but completely ambulatory  Blood pressure 100/72, pulse 85, temperature 97.8 F (36.6 C), temperature source Tympanic, resp. rate 16, height 5\' 2"  (1.575 m), weight 157 lb 3.2 oz (71.3 kg), SpO2 99 %.  LABORATORY DATA: Lab Results  Component Value Date   WBC 5.6 06/21/2020   HGB 11.9 (L) 06/21/2020   HCT 35.9 (L) 06/21/2020   MCV 92.5 06/21/2020   PLT 274 06/21/2020      Chemistry      Component Value Date/Time   NA 138 06/21/2020 0944   K 4.0 06/21/2020 0944   CL 106 06/21/2020 0944   CO2 22 06/21/2020 0944   BUN 13 06/21/2020 0944   CREATININE 1.27 (H) 06/21/2020 0944      Component Value Date/Time   CALCIUM 9.1 06/21/2020 0944   ALKPHOS 84 06/21/2020 0944   AST 14 (L) 06/21/2020 0944   ALT 9 06/21/2020 0944   BILITOT 0.3 06/21/2020 0944       RADIOGRAPHIC STUDIES: No results found.  ASSESSMENT AND PLAN: This is a very pleasant 65 years old white female with stage IIIA non-small cell lung cancer, presented with right upper lobe lung nodule and mediastinal lymphadenopathy diagnosed in January 2021.   She completed a course of concurrent chemoradiation with weekly carboplatin and paclitaxel for 5 cycles and tolerated her treatment well.  She has partial response. The patient is currently undergoing consolidation treatment with Imfinzi 1500 mg IV every 4 weeks status post 12 cycles. She has been tolerating this treatment well with no concerning adverse effects. I recommended for the patient to proceed  with cycle #13 today today as planned. She will come back for follow-up visit in 1 months for evaluation with repeat CT scan of the chest for restaging of her disease. For the smoking cessation, I will continue to advise the patient to quit smoking but she is not interested. She was advised to call immediately if she has any concerning symptoms in the interval. The patient voices understanding of current disease status and treatment options and is in agreement with the current care plan.  All questions were answered. The patient knows to call the clinic with any problems, questions or concerns. We can certainly see the patient much sooner if necessary.   Disclaimer: This note was dictated with voice recognition software. Similar sounding words can inadvertently be transcribed and may not be corrected upon review.

## 2020-06-21 NOTE — Patient Instructions (Signed)
[facility] Discharge Instructions for Patients Receiving Chemotherapy  Today you received the following immunotherapy agent: Durvalumab (Imfinzi)  To help prevent nausea and vomiting after your treatment, we encourage you to take your nausea medication as directed by your MD.   If you develop nausea and vomiting that is not controlled by your nausea medication, call the clinic.   BELOW ARE SYMPTOMS THAT SHOULD BE REPORTED IMMEDIATELY:  *FEVER GREATER THAN 100.5 F  *CHILLS WITH OR WITHOUT FEVER  NAUSEA AND VOMITING THAT IS NOT CONTROLLED WITH YOUR NAUSEA MEDICATION  *UNUSUAL SHORTNESS OF BREATH  *UNUSUAL BRUISING OR BLEEDING  TENDERNESS IN MOUTH AND THROAT WITH OR WITHOUT PRESENCE OF ULCERS  *URINARY PROBLEMS  *BOWEL PROBLEMS  UNUSUAL RASH Items with * indicate a potential emergency and should be followed up as soon as possible.  Feel free to call the clinic should you have any questions or concerns. The clinic phone number is [phone].  Please show the [facility] at check-in to the Emergency Department and triage nurse.

## 2020-06-26 ENCOUNTER — Ambulatory Visit (AMBULATORY_SURGERY_CENTER): Payer: Medicare Other | Admitting: Gastroenterology

## 2020-06-26 ENCOUNTER — Other Ambulatory Visit: Payer: Self-pay

## 2020-06-26 ENCOUNTER — Encounter: Payer: Self-pay | Admitting: Gastroenterology

## 2020-06-26 VITALS — BP 105/61 | HR 82 | Temp 97.3°F | Resp 16 | Ht 62.0 in | Wt 154.0 lb

## 2020-06-26 DIAGNOSIS — K208 Other esophagitis without bleeding: Secondary | ICD-10-CM

## 2020-06-26 DIAGNOSIS — K2950 Unspecified chronic gastritis without bleeding: Secondary | ICD-10-CM

## 2020-06-26 DIAGNOSIS — R1310 Dysphagia, unspecified: Secondary | ICD-10-CM

## 2020-06-26 DIAGNOSIS — K2990 Gastroduodenitis, unspecified, without bleeding: Secondary | ICD-10-CM | POA: Diagnosis not present

## 2020-06-26 DIAGNOSIS — K2970 Gastritis, unspecified, without bleeding: Secondary | ICD-10-CM | POA: Diagnosis not present

## 2020-06-26 DIAGNOSIS — K2980 Duodenitis without bleeding: Secondary | ICD-10-CM | POA: Diagnosis not present

## 2020-06-26 MED ORDER — SODIUM CHLORIDE 0.9 % IV SOLN: 500.0000 mL | Freq: Once | INTRAVENOUS | Status: DC

## 2020-06-26 NOTE — Progress Notes (Signed)
VS by MO   I have reviewed the patient's medical history in detail and updated the computerized patient record.  

## 2020-06-26 NOTE — Progress Notes (Signed)
Called to room to assist during endoscopic procedure.  Patient ID and intended procedure confirmed with present staff. Received instructions for my participation in the procedure from the performing physician.  

## 2020-06-26 NOTE — Op Note (Signed)
[facility] Patient Name: Betty Anderson Procedure Date: 06/26/2020 2:33 PM MRN: [id number] Endoscopist: Thornton Park MD, MD Age: 65 Referring MD:  Date of Birth: October 08, 1955 Gender: Female Account #: [id number] Procedure:                Upper GI endoscopy Indications:              Dysphagia in the setting of non-small cell lung                            cancer treated with chemoradiation Medicines:                Monitored Anesthesia Care Procedure:                Pre-Anesthesia Assessment:                           - Prior to the procedure, a History and Physical                            was performed, and patient medications and                            allergies were reviewed. The patient's tolerance of                            previous anesthesia was also reviewed. The risks                            and benefits of the procedure and the sedation                            options and risks were discussed with the patient.                            All questions were answered, and informed consent                            was obtained. Prior Anticoagulants: The patient has                            taken no previous anticoagulant or antiplatelet                            agents. ASA Grade Assessment: III - A patient with                            severe systemic disease. After reviewing the risks                            and benefits, the patient was deemed in                            satisfactory condition to undergo the procedure.  After obtaining informed consent, the endoscope was                            passed under direct vision. Throughout the                            procedure, the patient's blood pressure, pulse, and                            oxygen saturations were monitored continuously. The                            Endoscope was introduced through the mouth, and                            advanced to  the third part of duodenum. The upper                            GI endoscopy was accomplished without difficulty.                            The patient tolerated the procedure well. Scope In: Scope Out: Findings:                 Severe esophagitis with no bleeding was found 20 cm                            to 27 cm from the incisors with some associated                            luminal narrowing. There was no resistance to the                            gastroscope. Endoscopic appearance is consistent                            with radiation esophagitis with developing                            stricture. The z-line is normal, and located at 32                            cm from the incisors.                           Patchy mild to moderate inflammation characterized                            by erythema, friability and granularity was found                            in the gastric body. Biopsies were taken from the  antrum, body, and fundus with a cold forceps for                            histology. Estimated blood loss was minimal.                           Patchy mildly erythematous mucosa without active                            bleeding and with no stigmata of bleeding was found                            in the duodenal bulb. Biopsies were taken with a                            cold forceps for histology. Estimated blood loss                            was minimal.                           The cardia and gastric fundus were normal on                            retroflexion.                           The exam was otherwise without abnormality. Complications:            No immediate complications. Estimated blood loss:                            Minimal. Estimated Blood Loss:     Estimated blood loss was minimal. Impression:               - Radiation esophagitis with developing stricture.                           - Gastritis. Biopsied.                            - Erythematous duodenopathy. Biopsied.                           - The examination was otherwise normal. Recommendation:           - Patient has a contact number available for                            emergencies. The signs and symptoms of potential                            delayed complications were discussed with the                            patient. Return to normal activities tomorrow.  Written discharge instructions were provided to the                            patient.                           - Resume previous diet. Avoid tobacco, alcohol,                            coffee, carbonated beverages and spicy foods -                            which may exacerbated the esophagitis. Small                            frequent meals and a soft diet may be easier to                            swallow.                           - Continue present medications including                            pantoprazole 40 mg BID. Consider adding famotidine                            20 mg BID.                           - Avoid NSAIDs.                           - Await pathology results.                           - Review these findings with radiation oncology for                            additional recommendations for treatment. Thornton Park MD, MD 06/26/2020 3:09:21 PM This report has been signed electronically.

## 2020-06-26 NOTE — Progress Notes (Signed)
pt tolerated well. VSS. awake and to recovery. Report given to RN.  

## 2020-06-26 NOTE — Patient Instructions (Signed)
Handouts given for Esophagitis, Gastritis and Coping with Heartburn.  Await biopsy results.  Avoid irritants like tobacco, alcohol, coffee and carbonated beverages and spicy foods.  Eat small frequent meals and soft diet may help to swallow.  Avoid NSAIDS like aspirin, aleve, naproxen and Ibuprofen.  YOU HAD AN ENDOSCOPIC PROCEDURE TODAY AT [facility]:   Refer to the procedure report that was given to you for any specific questions about what was found during the examination.  If the procedure report does not answer your questions, please call your gastroenterologist to clarify.  If you requested that your care partner not be given the details of your procedure findings, then the procedure report has been included in a sealed envelope for you to review at your convenience later.  YOU SHOULD EXPECT: Some feelings of bloating in the abdomen. Passage of more gas than usual.  Walking can help get rid of the air that was put into your GI tract during the procedure and reduce the bloating. If you had a lower endoscopy (such as a colonoscopy or flexible sigmoidoscopy) you may notice spotting of blood in your stool or on the toilet paper. If you underwent a bowel prep for your procedure, you may not have a normal bowel movement for a few days.  Please Note:  You might notice some irritation and congestion in your nose or some drainage.  This is from the oxygen used during your procedure.  There is no need for concern and it should clear up in a day or so.  SYMPTOMS TO REPORT IMMEDIATELY:   Following upper endoscopy (EGD)  Vomiting of blood or coffee ground material  New chest pain or pain under the shoulder blades  Painful or persistently difficult swallowing  New shortness of breath  Fever of 100F or higher  Black, tarry-looking stools  For urgent or emergent issues, a gastroenterologist can be reached at any hour by calling [phone]. Do not use MyChart messaging for  urgent concerns.    DIET:  We do recommend a small meal at first, but then you may proceed to your regular diet.  Drink plenty of fluids but you should avoid alcoholic beverages for 24 hours.  ACTIVITY:  You should plan to take it easy for the rest of today and you should NOT DRIVE or use heavy machinery until tomorrow (because of the sedation medicines used during the test).    FOLLOW UP: Our staff will call the number listed on your records 48-72 hours following your procedure to check on you and address any questions or concerns that you may have regarding the information given to you following your procedure. If we do not reach you, we will leave a message.  We will attempt to reach you two times.  During this call, we will ask if you have developed any symptoms of COVID 19. If you develop any symptoms (ie: fever, flu-like symptoms, shortness of breath, cough etc.) before then, please call [phone].  If you test positive for Covid 19 in the 2 weeks post procedure, please call and report this information to [place].    If any biopsies were taken you will be contacted by phone or by letter within the next 1-3 weeks.  Please call us at [phone] if you have not heard about the biopsies in 3 weeks.    SIGNATURES/CONFIDENTIALITY: You and/or your care partner have signed paperwork which will be entered into your electronic medical record.  These signatures attest to  the fact that that the information above on your After Visit Summary has been reviewed and is understood.  Full responsibility of the confidentiality of this discharge information lies with you and/or your care-partner.

## 2020-06-28 ENCOUNTER — Telehealth: Payer: Self-pay

## 2020-06-28 NOTE — Telephone Encounter (Signed)
  Follow up Call-  Call back number 06/26/2020  Post procedure Call Back phone  # [phone]  Permission to leave phone message Yes     Patient questions:  Do you have a fever, pain , or abdominal swelling? No. Pain Score  0 *  Have you tolerated food without any problems? Yes.  Have you been able to return to your normal activities? Yes.    Do you have any questions about your discharge instructions: Diet   No. Medications  No. Follow up visit  No.  Do you have questions or concerns about your Care? No.  Actions: * If pain score is 4 or above: No action needed, pain <4.  1. Have you developed a fever since your procedure? no  2.   Have you had an respiratory symptoms (SOB or cough) since your procedure? no  3.   Have you tested positive for COVID 19 since your procedure no  4.   Have you had any family members/close contacts diagnosed with the COVID 19 since your procedure?  no   If yes to any of these questions please route to Joylene John, RN and Joella Prince, RN

## 2020-07-10 ENCOUNTER — Other Ambulatory Visit: Payer: Self-pay

## 2020-07-10 MED ORDER — SUCRALFATE 1 GM/10ML PO SUSP: 1.0000 g | Freq: Three times a day (TID) | ORAL | 2 refills | Status: DC

## 2020-07-19 ENCOUNTER — Other Ambulatory Visit: Payer: Medicare Other

## 2020-07-19 ENCOUNTER — Ambulatory Visit: Payer: Medicare Other

## 2020-07-19 ENCOUNTER — Ambulatory Visit: Payer: Medicare Other | Admitting: Internal Medicine

## 2020-07-20 ENCOUNTER — Other Ambulatory Visit: Payer: Self-pay

## 2020-07-20 ENCOUNTER — Ambulatory Visit (HOSPITAL_COMMUNITY)
Admission: RE | Admit: 2020-07-20 | Discharge: 2020-07-20 | Disposition: A | Discharge status: Home / Self Care | Payer: Medicare Other | Source: Ambulatory Visit | Attending: Internal Medicine | Admitting: Internal Medicine

## 2020-07-20 DIAGNOSIS — C3490 Malignant neoplasm of unspecified part of unspecified bronchus or lung: Secondary | ICD-10-CM | POA: Insufficient documentation

## 2020-07-20 MED ORDER — IOHEXOL 300 MG/ML  SOLN
75.0000 mL | Freq: Once | INTRAMUSCULAR | Status: AC | PRN
  Administered 2020-07-20: 75 mL via INTRAVENOUS

## 2020-07-23 ENCOUNTER — Other Ambulatory Visit: Payer: Self-pay

## 2020-07-23 ENCOUNTER — Inpatient Hospital Stay: Payer: Medicare Other | Attending: Internal Medicine

## 2020-07-23 DIAGNOSIS — Z9221 Personal history of antineoplastic chemotherapy: Secondary | ICD-10-CM | POA: Insufficient documentation

## 2020-07-23 DIAGNOSIS — N281 Cyst of kidney, acquired: Secondary | ICD-10-CM | POA: Insufficient documentation

## 2020-07-23 DIAGNOSIS — C3411 Malignant neoplasm of upper lobe, right bronchus or lung: Secondary | ICD-10-CM | POA: Insufficient documentation

## 2020-07-23 DIAGNOSIS — R0609 Other forms of dyspnea: Secondary | ICD-10-CM | POA: Diagnosis not present

## 2020-07-23 DIAGNOSIS — Z923 Personal history of irradiation: Secondary | ICD-10-CM | POA: Diagnosis not present

## 2020-07-23 DIAGNOSIS — J439 Emphysema, unspecified: Secondary | ICD-10-CM | POA: Diagnosis not present

## 2020-07-23 DIAGNOSIS — J432 Centrilobular emphysema: Secondary | ICD-10-CM | POA: Insufficient documentation

## 2020-07-23 DIAGNOSIS — C3490 Malignant neoplasm of unspecified part of unspecified bronchus or lung: Secondary | ICD-10-CM

## 2020-07-23 DIAGNOSIS — R059 Cough, unspecified: Secondary | ICD-10-CM | POA: Insufficient documentation

## 2020-07-23 DIAGNOSIS — I700 Atherosclerosis of aorta: Secondary | ICD-10-CM | POA: Insufficient documentation

## 2020-07-23 DIAGNOSIS — M47814 Spondylosis without myelopathy or radiculopathy, thoracic region: Secondary | ICD-10-CM | POA: Diagnosis not present

## 2020-07-23 DIAGNOSIS — Z79899 Other long term (current) drug therapy: Secondary | ICD-10-CM | POA: Insufficient documentation

## 2020-07-23 DIAGNOSIS — R5383 Other fatigue: Secondary | ICD-10-CM | POA: Insufficient documentation

## 2020-07-23 LAB — CBC WITH DIFFERENTIAL (CANCER CENTER ONLY)
Abs Immature Granulocytes: 0.02 10*3/uL (ref 0.00–0.07)
Basophils Absolute: 0.1 10*3/uL (ref 0.0–0.1)
Basophils Relative: 1 %
Eosinophils Absolute: 0.1 10*3/uL (ref 0.0–0.5)
Eosinophils Relative: 2 %
HCT: 37.8 % (ref 36.0–46.0)
Hemoglobin: 12.4 g/dL (ref 12.0–15.0)
Immature Granulocytes: 0 %
Lymphocytes Relative: 14 %
Lymphs Abs: 0.8 10*3/uL (ref 0.7–4.0)
MCH: 30.9 pg (ref 26.0–34.0)
MCHC: 32.8 g/dL (ref 30.0–36.0)
MCV: 94.3 fL (ref 80.0–100.0)
Monocytes Absolute: 0.5 10*3/uL (ref 0.1–1.0)
Monocytes Relative: 8 %
Neutro Abs: 4.3 10*3/uL (ref 1.7–7.7)
Neutrophils Relative %: 75 %
Platelet Count: 289 10*3/uL (ref 150–400)
RBC: 4.01 MIL/uL (ref 3.87–5.11)
RDW: 14 % (ref 11.5–15.5)
WBC Count: 5.8 10*3/uL (ref 4.0–10.5)
nRBC: 0 % (ref 0.0–0.2)

## 2020-07-23 LAB — CMP (CANCER CENTER ONLY)
ALT: 8 U/L (ref 0–44)
AST: 13 U/L — ABNORMAL LOW (ref 15–41)
Albumin: 3.6 g/dL (ref 3.5–5.0)
Alkaline Phosphatase: 98 U/L (ref 38–126)
Anion gap: 9 (ref 5–15)
BUN: 15 mg/dL (ref 8–23)
CO2: 27 mmol/L (ref 22–32)
Calcium: 9.1 mg/dL (ref 8.9–10.3)
Chloride: 103 mmol/L (ref 98–111)
Creatinine: 1.33 mg/dL — ABNORMAL HIGH (ref 0.44–1.00)
GFR, Estimated: 44 mL/min — ABNORMAL LOW (ref >60)
Glucose, Bld: 96 mg/dL (ref 70–99)
Potassium: 3.6 mmol/L (ref 3.5–5.1)
Sodium: 139 mmol/L (ref 135–145)
Total Bilirubin: 0.2 mg/dL — ABNORMAL LOW (ref 0.3–1.2)
Total Protein: 7.7 g/dL (ref 6.5–8.1)

## 2020-07-25 ENCOUNTER — Other Ambulatory Visit: Payer: Self-pay

## 2020-07-25 ENCOUNTER — Inpatient Hospital Stay (HOSPITAL_BASED_OUTPATIENT_CLINIC_OR_DEPARTMENT_OTHER): Payer: Medicare Other | Admitting: Internal Medicine

## 2020-07-25 VITALS — BP 102/77 | HR 96 | Temp 97.1°F | Resp 18 | Ht 62.0 in | Wt 156.8 lb

## 2020-07-25 DIAGNOSIS — Z5111 Encounter for antineoplastic chemotherapy: Secondary | ICD-10-CM | POA: Diagnosis not present

## 2020-07-25 DIAGNOSIS — C3490 Malignant neoplasm of unspecified part of unspecified bronchus or lung: Secondary | ICD-10-CM | POA: Diagnosis not present

## 2020-07-25 DIAGNOSIS — C3411 Malignant neoplasm of upper lobe, right bronchus or lung: Secondary | ICD-10-CM

## 2020-07-25 NOTE — Progress Notes (Signed)
[place] [phone]   Fax:([phone]) [place], MD [place] [address]  DIAGNOSIS: Stage IIIA (T1b, N2, M0) non-small cell lung cancer presented with right upper lobe lung nodule in addition to mediastinal lymphadenopathy diagnosed in January 2021.  PRIOR THERAPY:  1) Concurrent chemoradiation with weekly carboplatin for AUC of 2 and paclitaxel 45 NG/M2. Status post5cycles. Last dose received on 05/30/19. 2) Consolidation immunotherapy with Imfinzi 1500 mg/kg IV every 4 weeks. First dose expected on 07/21/2019. Status post 13 cycles.  CURRENT THERAPY:  None   INTERVAL HISTORY: Betty Anderson 65 y.o. female returns to the clinic today for follow-up visit accompanied by her husband.  The patient is feeling fine today with no concerning complaints except for the persistent cough productive of yellowish sputum.  She also continues to smoke more than 2 packs every day.  She is not willing to quit.  She denied having any chest pain but has shortness of breath with exertion with no hemoptysis.  She denied having any fever or chills.  She has no nausea, vomiting, diarrhea or constipation.  She denied having any headache or visual changes.  The patient tolerated her previous treatment with immunotherapy fairly well.  She is here for evaluation and repeat CT scan of the chest for restaging of her disease.  MEDICAL HISTORY: Past Medical History:  Diagnosis Date  . COPD (chronic obstructive pulmonary disease) (HCC)    emphysema  . Hyperlipidemia   . Hypertension   . lung ca dx'd 03/2019  . Lung nodule   . Paroxysmal atrial flutter ([place])    per cardiology note  . Peripheral artery disease ([place])   . Tobacco abuse   . Vitamin D deficiency   . Wears glasses     ALLERGIES:  has No Known Allergies.  MEDICATIONS:  Current Outpatient Medications  Medication Sig Dispense Refill  . albuterol (PROVENTIL)  (2.5 MG/3ML) 0.083% nebulizer solution Take 3 mLs (2.5 mg total) by nebulization every 6 (six) hours as needed for wheezing or shortness of breath. (Patient not taking: No sig reported) 75 mL 12  . aspirin EC 81 MG tablet Take 1 tablet (81 mg total) by mouth daily. 90 tablet 3  . Cholecalciferol (DIALYVITE VITAMIN D 5000) 125 MCG (5000 UT) capsule Take 5,000 Units by mouth daily.    Marland Kitchen escitalopram (LEXAPRO) 10 MG tablet Take 10 mg by mouth daily.    Marland Kitchen guaiFENesin (MUCINEX) 600 MG 12 hr tablet Take by mouth 2 (two) times daily.    . pantoprazole (PROTONIX) 40 MG tablet Take 1 tablet (40 mg total) by mouth 2 (two) times daily. 180 tablet 3  . rosuvastatin (CRESTOR) 20 MG tablet Take 20 mg by mouth every evening.     . sucralfate (CARAFATE) 1 GM/10ML suspension Take 10 mLs (1 g total) by mouth 4 (four) times daily -  with meals and at bedtime. 420 mL 2  . Tiotropium Bromide-Olodaterol (STIOLTO RESPIMAT) 2.5-2.5 MCG/ACT AERS Inhale 2 puffs into the lungs daily. (Patient not taking: No sig reported) 4 g 0  . vitamin B-12 (CYANOCOBALAMIN) 1000 MCG tablet Take 1,000 mcg by mouth daily.     No current facility-administered medications for this visit.    SURGICAL HISTORY:  Past Surgical History:  Procedure Laterality Date  . BRONCHIAL NEEDLE ASPIRATION BIOPSY  04/12/2019   Procedure: BRONCHIAL NEEDLE ASPIRATION BIOPSIES;  Surgeon: Garner Nash, DO;  Location: [place];  Service: Cardiopulmonary;;  . ENDOBRONCHIAL ULTRASOUND N/A 04/12/2019   Procedure: ENDOBRONCHIAL ULTRASOUND;  Surgeon: Garner Nash, DO;  Location: [place];  Service: Cardiopulmonary;  Laterality: N/A;  . OVARIAN CYST REMOVAL    . TONSILLECTOMY    . TUBAL LIGATION    . VIDEO BRONCHOSCOPY N/A 04/12/2019   Procedure: VIDEO BRONCHOSCOPY WITHOUT FLUORO;  Surgeon: Garner Nash, DO;  Location: [place];  Service: Cardiopulmonary;  Laterality: N/A;    REVIEW OF SYSTEMS:  Constitutional: positive for fatigue Eyes:  negative Ears, nose, mouth, throat, and face: negative Respiratory: positive for cough and dyspnea on exertion Cardiovascular: negative Gastrointestinal: negative Genitourinary:negative Integument/breast: negative Hematologic/lymphatic: negative Musculoskeletal:negative Neurological: negative Behavioral/Psych: negative Endocrine: negative Allergic/Immunologic: negative   PHYSICAL EXAMINATION: General appearance: alert, cooperative, fatigued and no distress Head: Normocephalic, without obvious abnormality, atraumatic Neck: no adenopathy, no JVD, supple, symmetrical, trachea midline and thyroid not enlarged, symmetric, no tenderness/mass/nodules Lymph nodes: Cervical, supraclavicular, and axillary nodes normal. Resp: clear to auscultation bilaterally Back: symmetric, no curvature. ROM normal. No CVA tenderness. Cardio: regular rate and rhythm, S1, S2 normal, no murmur, click, rub or gallop GI: soft, non-tender; bowel sounds normal; no masses,  no organomegaly Extremities: extremities normal, atraumatic, no cyanosis or edema Neurologic: Alert and oriented X 3, normal strength and tone. Normal symmetric reflexes. Normal coordination and gait  ECOG PERFORMANCE STATUS: 1 - Symptomatic but completely ambulatory  Blood pressure 102/77, pulse 96, temperature (!) 97.1 F (36.2 C), temperature source Tympanic, resp. rate 18, height 5\' 2"  (1.575 m), weight 156 lb 12.8 oz (71.1 kg), SpO2 100 %.  LABORATORY DATA: Lab Results  Component Value Date   WBC 5.8 07/23/2020   HGB 12.4 07/23/2020   HCT 37.8 07/23/2020   MCV 94.3 07/23/2020   PLT 289 07/23/2020      Chemistry      Component Value Date/Time   NA 139 07/23/2020 1055   K 3.6 07/23/2020 1055   CL 103 07/23/2020 1055   CO2 27 07/23/2020 1055   BUN 15 07/23/2020 1055   CREATININE 1.33 (H) 07/23/2020 1055      Component Value Date/Time   CALCIUM 9.1 07/23/2020 1055   ALKPHOS 98 07/23/2020 1055   AST 13 (L) 07/23/2020 1055    ALT 8 07/23/2020 1055   BILITOT 0.2 (L) 07/23/2020 1055       RADIOGRAPHIC STUDIES: CT Chest W Contrast  Result Date: 07/20/2020 CLINICAL DATA:  Stage III non-small cell right upper lobe lobe cancer status post chemo radiation therapy. Restaging. EXAM: CT CHEST WITH CONTRAST TECHNIQUE: Multidetector CT imaging of the chest was performed during intravenous contrast administration. CONTRAST:  24mL OMNIPAQUE IOHEXOL 300 MG/ML  SOLN COMPARISON:  03/26/2020 chest CT. FINDINGS: Cardiovascular: Normal heart size. No significant pericardial effusion/thickening. Left anterior descending and left circumflex coronary atherosclerosis. Atherosclerotic nonaneurysmal thoracic aorta. Normal caliber pulmonary arteries. No central pulmonary emboli. Mediastinum/Nodes: No discrete thyroid nodules. Unremarkable esophagus. No pathologically enlarged axillary, mediastinal or hilar lymph nodes. Lungs/Pleura: No pneumothorax. No pleural effusion. Moderate centrilobular and paraseptal emphysema with diffuse bronchial wall thickening. No acute consolidative airspace disease or lung masses. Irregular 0.9 cm solid right upper lobe pulmonary nodule (series 5/image 39), previously 0.9 cm using similar measurement technique, stable. Irregular medial right upper lobe 1.1 cm solid pulmonary nodule (series 5/image 41), increased from 0.8 cm. Peripheral apical right upper lobe 0.6 cm solid pulmonary nodule (series 5/image 23), increased from 0.4 cm. Indistinct posterior right upper lobe 0.3 cm nodule is new (series 5/image 36).  Mild patchy peripheral reticulation and ground-glass opacity throughout both lungs is not appreciably changed. Upper abdomen: Simple exophytic 1.6 cm lateral interpolar left renal cyst. Trace perihepatic ascites is new. Musculoskeletal: No aggressive appearing focal osseous lesions. Moderate thoracic spondylosis. IMPRESSION: 1. Interval growth of three irregular solid pulmonary nodules scattered in the right upper  lobe, compatible with metastatic disease. 2. The separate primary treated nodule in the right upper lobe is unchanged in the interval. 3. No additional sites of new or progressive metastatic disease in the chest. 4. Trace perihepatic ascites, new. 5. Aortic Atherosclerosis (ICD10-I70.0) and Emphysema (ICD10-J43.9). Electronically Signed   By: Ilona Sorrel M.D.   On: 07/20/2020 17:05    ASSESSMENT AND PLAN: This is a very pleasant 65 years old white female with stage IIIA non-small cell lung cancer, presented with right upper lobe lung nodule and mediastinal lymphadenopathy diagnosed in January 2021.   She completed a course of concurrent chemoradiation with weekly carboplatin and paclitaxel for 5 cycles and tolerated her treatment well.  She has partial response. The patient underwent consolidation treatment with Imfinzi 1500 mg IV every 4 weeks status post 13 cycles.  She tolerated this treatment well with no concerning adverse effects. She had repeat CT scan of the chest performed recently.  I personally and independently reviewed the scan images and discussed the results with the patient and her husband. Her scan showed interval increase in size of irregular solid pulmonary nodules scattered in the right upper lobe and compatible with metastatic disease.  I had a lengthy discussion with the patient and her husband about her condition and treatment options.  I discussed with the patient the option of continuous observation and monitoring for this small nodules versus referral to radiation oncology for SBRT to the large pulmonary nodules versus consideration of systemic chemotherapy with carboplatin and paclitaxel.  I discussed the adverse effect of this treatment with the patient today.  She elected to proceed with the SBRT for now. I will see her back for follow-up visit in 3 months for evaluation and repeat CT scan of the chest.  If she has any further evidence for disease progression on the upcoming  scan, I will consider her for systemic chemotherapy or palliative care. I also strongly encouraged the patient to quit smoking but she is not willing to quit. She was advised to call immediately if she has any other concerning symptoms in the interval. The patient voices understanding of current disease status and treatment options and is in agreement with the current care plan.  All questions were answered. The patient knows to call the clinic with any problems, questions or concerns. We can certainly see the patient much sooner if necessary.   Disclaimer: This note was dictated with voice recognition software. Similar sounding words can inadvertently be transcribed and may not be corrected upon review.

## 2020-07-26 ENCOUNTER — Telehealth: Payer: Self-pay | Admitting: Internal Medicine

## 2020-07-26 NOTE — Telephone Encounter (Signed)
Scheduled per los. Called and spoke with patient. Confirmed appt 

## 2020-08-02 ENCOUNTER — Other Ambulatory Visit: Payer: Self-pay

## 2020-08-02 ENCOUNTER — Ambulatory Visit
Admission: RE | Admit: 2020-08-02 | Discharge: 2020-08-02 | Disposition: A | Discharge status: Home / Self Care | Payer: Medicare Other | Source: Ambulatory Visit | Attending: Radiation Oncology | Admitting: Radiation Oncology

## 2020-08-02 ENCOUNTER — Ambulatory Visit
Admission: RE | Admit: 2020-08-02 | Discharge: 2020-08-02 | Disposition: A | Discharge status: Home / Self Care | Payer: Medicare Other | Source: Ambulatory Visit | Attending: Internal Medicine | Admitting: Internal Medicine

## 2020-08-02 DIAGNOSIS — C3411 Malignant neoplasm of upper lobe, right bronchus or lung: Secondary | ICD-10-CM

## 2020-08-02 NOTE — Progress Notes (Signed)
Radiation Oncology         [phone] ________________________________  Initial Outpatient Consultation - Conducted via telephone due to current COVID-19 concerns for limiting patient exposure  I spoke with the patient to conduct this consult visit via telephone to spare the patient unnecessary potential exposure in the healthcare setting during the current COVID-19 pandemic. The patient was notified in advance and was offered a [place] meeting to allow for face to face communication but unfortunately reported that they did not have the appropriate resources/technology to support such a visit and instead preferred to proceed with a telephone consult.    Name: Betty Anderson        MRN: [id number]  Date of Service: 08/02/2020 DOB: 1956-03-24  JS:[id number], Elta Guadeloupe, MD  Curt Bears, MD     REFERRING PHYSICIAN: Curt Bears, MD   DIAGNOSIS: The encounter diagnosis was Malignant neoplasm of upper lobe of right lung [facility]).   HISTORY OF PRESENT ILLNESS: Betty Anderson is a 65 y.o. female with a history of Stage IIIA, cT1bN2M0, NSCLC, cannot further classify of the RUL who was diagnosed in the early part of 2021 and completed chemoRT in March 2021. She continued with consolidative immunotherapy until 06/21/20. She recently had restaging CT on 07/20/20 that showed interval growth of three nodules in the RUL measuring 9 mm, 11 mm, and 6 mm, the later two had increased in a 3 month interval. She's been offered evaluation again with Dr. Lisbeth Renshaw for consideration of stereotactic body radiotherapy (SBRT) versus resuming chemotherapy. She favored meeting with [place] and is contacted today to discuss possible SBRT.   Interval Since Last Radiation: 13 months  05/02/19-06/16/19: The right lung target was treated to 60 Gy in 30 fraction followed by a 6 Gy boost in 3 fractions.    PAST MEDICAL HISTORY:  Past Medical History:  Diagnosis Date  . COPD (chronic obstructive pulmonary disease) (HCC)     emphysema  . Hyperlipidemia   . Hypertension   . lung ca dx'd 03/2019  . Lung nodule   . Paroxysmal atrial flutter ([place])    per cardiology note  . Peripheral artery disease ([place])   . Tobacco abuse   . Vitamin D deficiency   . Wears glasses        PAST SURGICAL HISTORY: Past Surgical History:  Procedure Laterality Date  . BRONCHIAL NEEDLE ASPIRATION BIOPSY  04/12/2019   Procedure: BRONCHIAL NEEDLE ASPIRATION BIOPSIES;  Surgeon: Garner Nash, DO;  Location: [place];  Service: Cardiopulmonary;;  . ENDOBRONCHIAL ULTRASOUND N/A 04/12/2019   Procedure: ENDOBRONCHIAL ULTRASOUND;  Surgeon: Garner Nash, DO;  Location: [place];  Service: Cardiopulmonary;  Laterality: N/A;  . OVARIAN CYST REMOVAL    . TONSILLECTOMY    . TUBAL LIGATION    . VIDEO BRONCHOSCOPY N/A 04/12/2019   Procedure: VIDEO BRONCHOSCOPY WITHOUT FLUORO;  Surgeon: Garner Nash, DO;  Location: [place];  Service: Cardiopulmonary;  Laterality: N/A;     FAMILY HISTORY:  Family History  Problem Relation Age of Onset  . Stroke Mother   . Stroke Father   . Lung cancer Father   . Mesothelioma Brother   . Lung cancer Sister   . Colon cancer Sister   . Anuerysm Brother      SOCIAL HISTORY:  reports that she has been smoking cigarettes. She started smoking about 58 years ago. She has a 175.00 pack-year smoking history. She has never used smokeless tobacco. She reports current alcohol use of about 8.0 standard drinks of  alcohol per week. She reports that she does not use drugs. The patient is married and lives in [place].    ALLERGIES: Patient has no known allergies.   MEDICATIONS:  Current Outpatient Medications  Medication Sig Dispense Refill  . albuterol (PROVENTIL) (2.5 MG/3ML) 0.083% nebulizer solution Take 3 mLs (2.5 mg total) by nebulization every 6 (six) hours as needed for wheezing or shortness of breath. 75 mL 12  . aspirin EC 81 MG tablet Take 1 tablet (81 mg total) by mouth daily. 90  tablet 3  . Cholecalciferol (DIALYVITE VITAMIN D 5000) 125 MCG (5000 UT) capsule Take 5,000 Units by mouth daily.    Marland Kitchen escitalopram (LEXAPRO) 10 MG tablet Take 10 mg by mouth daily.    Marland Kitchen guaiFENesin (MUCINEX) 600 MG 12 hr tablet Take by mouth 2 (two) times daily.    Marland Kitchen moxifloxacin (VIGAMOX) 0.5 % ophthalmic solution Apply to eye.    . pantoprazole (PROTONIX) 40 MG tablet Take 1 tablet (40 mg total) by mouth 2 (two) times daily. 180 tablet 3  . prednisoLONE acetate (PRED FORTE) 1 % ophthalmic suspension SMARTSIG:In Eye(s)    . rosuvastatin (CRESTOR) 20 MG tablet Take 20 mg by mouth every evening.     . sucralfate (CARAFATE) 1 GM/10ML suspension Take 10 mLs (1 g total) by mouth 4 (four) times daily -  with meals and at bedtime. 420 mL 2  . Tiotropium Bromide-Olodaterol (STIOLTO RESPIMAT) 2.5-2.5 MCG/ACT AERS Inhale 2 puffs into the lungs daily. 4 g 0  . vitamin B-12 (CYANOCOBALAMIN) 1000 MCG tablet Take 1,000 mcg by mouth daily.     No current facility-administered medications for this encounter.     REVIEW OF SYSTEMS: On review of systems, the patient reports that she is doing well overall. She reports she's not having any trouble breathing, hemoptysis, or chest pain. She is interested in treatment and hopeful to avoid chemotherapy. She just had cataract surgery yesterday and her ophthalmologist requests that we wait to treat her until about a month following her surgery. No other complaints are noted.   PHYSICAL EXAM:  Unable to assess due to encounter type.   ECOG = 0  0 - Asymptomatic (Fully active, able to carry on all predisease activities without restriction)  1 - Symptomatic but completely ambulatory (Restricted in physically strenuous activity but ambulatory and able to carry out work of a light or sedentary nature. For example, light housework, office work)  2 - Symptomatic, <50% in bed during the day (Ambulatory and capable of all self care but unable to carry out any work  activities. Up and about more than 50% of waking hours)  3 - Symptomatic, >50% in bed, but not bedbound (Capable of only limited self-care, confined to bed or chair 50% or more of waking hours)  4 - Bedbound (Completely disabled. Cannot carry on any self-care. Totally confined to bed or chair)  5 - Death   Eustace Pen MM, Creech RH, Tormey DC, et al. [phone]). "Toxicity and response criteria of the [facility]". [place] Oncol. 5 (6): 649-55    LABORATORY DATA:  Lab Results  Component Value Date   WBC 5.8 07/23/2020   HGB 12.4 07/23/2020   HCT 37.8 07/23/2020   MCV 94.3 07/23/2020   PLT 289 07/23/2020   Lab Results  Component Value Date   NA 139 07/23/2020   K 3.6 07/23/2020   CL 103 07/23/2020   CO2 27 07/23/2020   Lab Results  Component Value  Date   ALT 8 07/23/2020   AST 13 (L) 07/23/2020   ALKPHOS 98 07/23/2020   BILITOT 0.2 (L) 07/23/2020      RADIOGRAPHY: CT Chest W Contrast  Result Date: 07/20/2020 CLINICAL DATA:  Stage III non-small cell right upper lobe lobe cancer status post chemo radiation therapy. Restaging. EXAM: CT CHEST WITH CONTRAST TECHNIQUE: Multidetector CT imaging of the chest was performed during intravenous contrast administration. CONTRAST:  15mL OMNIPAQUE IOHEXOL 300 MG/ML  SOLN COMPARISON:  03/26/2020 chest CT. FINDINGS: Cardiovascular: Normal heart size. No significant pericardial effusion/thickening. Left anterior descending and left circumflex coronary atherosclerosis. Atherosclerotic nonaneurysmal thoracic aorta. Normal caliber pulmonary arteries. No central pulmonary emboli. Mediastinum/Nodes: No discrete thyroid nodules. Unremarkable esophagus. No pathologically enlarged axillary, mediastinal or hilar lymph nodes. Lungs/Pleura: No pneumothorax. No pleural effusion. Moderate centrilobular and paraseptal emphysema with diffuse bronchial wall thickening. No acute consolidative airspace disease or lung masses. Irregular 0.9 cm solid  right upper lobe pulmonary nodule (series 5/image 39), previously 0.9 cm using similar measurement technique, stable. Irregular medial right upper lobe 1.1 cm solid pulmonary nodule (series 5/image 41), increased from 0.8 cm. Peripheral apical right upper lobe 0.6 cm solid pulmonary nodule (series 5/image 23), increased from 0.4 cm. Indistinct posterior right upper lobe 0.3 cm nodule is new (series 5/image 36). Mild patchy peripheral reticulation and ground-glass opacity throughout both lungs is not appreciably changed. Upper abdomen: Simple exophytic 1.6 cm lateral interpolar left renal cyst. Trace perihepatic ascites is new. Musculoskeletal: No aggressive appearing focal osseous lesions. Moderate thoracic spondylosis. IMPRESSION: 1. Interval growth of three irregular solid pulmonary nodules scattered in the right upper lobe, compatible with metastatic disease. 2. The separate primary treated nodule in the right upper lobe is unchanged in the interval. 3. No additional sites of new or progressive metastatic disease in the chest. 4. Trace perihepatic ascites, new. 5. Aortic Atherosclerosis (ICD10-I70.0) and Emphysema (ICD10-J43.9). Electronically Signed   By: Ilona Sorrel M.D.   On: 07/20/2020 17:05       IMPRESSION/PLAN: 1. Progressive Stage IIIA, cT1bN2M0, NSCLC, cannot further classify of the RUL. Dr. Lisbeth Renshaw discusses the pathology findings and reviews the nature of progressive disease in the lung and the utility of focused radiotherapy. Dr. Lisbeth Renshaw would recommend a course of stereotactic body radiotherapy (SBRT) to each of the three lesions in the RUL. He recommends fusing the prior treatment plan to simulation for planning. We discussed the risks, benefits, short, and long term effects of radiotherapy, as well as the curative intent to these lesions, and the patient is interested in proceeding. Dr. Lisbeth Renshaw discusses the delivery and logistics of radiotherapy and anticipates a course of 3-5 fractions of  radiotherapy. She will simulate on 08/20/20.  Given current concerns for patient exposure during the COVID-19 pandemic, this encounter was conducted via telephone.  The patient has provided two factor identification and has given verbal consent for this type of encounter and has been advised to only accept a meeting of this type in a secure network environment. The time spent during this encounter was 45 minutes including preparation, discussion, and coordination of the patient's care. The attendants for this meeting include Dr. Lisbeth Renshaw, Hayden Pedro  and [facility] and her husband Asherah Lavoy.  During the encounter, Dr. Lisbeth Renshaw, and Hayden Pedro were located at [facility] Radiation Oncology Department.  Shawnya Fanguy was located at home with her husband Chassity Ludke.     The above documentation reflects my direct findings during this shared patient  visit. Please see the separate note by Dr. Lisbeth Renshaw on this date for the remainder of the patient's plan of care.    Carola Rhine, PAC

## 2020-08-16 ENCOUNTER — Ambulatory Visit: Payer: Medicare Other

## 2020-08-16 ENCOUNTER — Ambulatory Visit: Payer: Medicare Other | Admitting: Internal Medicine

## 2020-08-16 ENCOUNTER — Inpatient Hospital Stay: Payer: Medicare Other

## 2020-08-20 ENCOUNTER — Ambulatory Visit
Admission: RE | Admit: 2020-08-20 | Discharge: 2020-08-20 | Disposition: A | Discharge status: Home / Self Care | Payer: Medicare Other | Source: Ambulatory Visit | Attending: Radiation Oncology | Admitting: Radiation Oncology

## 2020-08-20 ENCOUNTER — Other Ambulatory Visit: Payer: Self-pay

## 2020-08-20 DIAGNOSIS — C3411 Malignant neoplasm of upper lobe, right bronchus or lung: Secondary | ICD-10-CM | POA: Diagnosis present

## 2020-08-23 ENCOUNTER — Other Ambulatory Visit: Payer: Self-pay

## 2020-08-23 ENCOUNTER — Ambulatory Visit (INDEPENDENT_AMBULATORY_CARE_PROVIDER_SITE_OTHER): Payer: Medicare Other | Admitting: Gastroenterology

## 2020-08-23 ENCOUNTER — Encounter: Payer: Self-pay | Admitting: Gastroenterology

## 2020-08-23 VITALS — BP 90/68 | HR 107 | Ht 63.0 in | Wt 158.2 lb

## 2020-08-23 DIAGNOSIS — R1310 Dysphagia, unspecified: Secondary | ICD-10-CM | POA: Diagnosis not present

## 2020-08-23 NOTE — Patient Instructions (Signed)
If you are age 67 or older, your body mass index should be between 23-30. Your Body mass index is 28.02 kg/m. If this is out of the aforementioned range listed, please consider follow up with your Primary Care Provider.  If you are age 40 or younger, your body mass index should be between 19-25. Your Body mass index is 28.02 kg/m. If this is out of the aformentioned range listed, please consider follow up with your Primary Care Provider.   The  GI providers would like to encourage you to use [facility] to communicate with providers for non-urgent requests or questions.  Due to long hold times on the telephone, sending your provider a message by [facility] may be faster and more efficient way to get a response. Please allow 48 business hours for a response.  Please remember that this is for non-urgent requests/questions.  Follow up with [place] as needed.  It was great seeing you today! Thank you for entrusting me with your care and choosing [facility].  Dr. Tarri Glenn

## 2020-08-23 NOTE — Progress Notes (Signed)
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  Biopsies were not obtained from the esophagus due to the concern.  However concurrent biopsies showed peptic changes in the stomach and duodenum. I reviewed her endoscopy findings with Dr. Ida Rogue PA.  She felt the timeframe was inconsistent with radiation esophagitis.  She deferred treatment to GI.  PLAN: -Continue to take small bites, chew well, eat slowly, and take sips of water between bites - Continue pantoprazole 40 mg BID - Avoid all NSAIDs - Barium esophagram with progressive symptoms - Encouraged smoking cessation - Follow-up PRN  HPI: Betty Anderson is a 65 y.o. female who returns in follow-up for dysphagia . The interval history is obtained through the patient, review of her electronic health record, and her husband who accompanies her to this appointment. Today is their 22nd anniversary.  She has stage IIIA non-small cell lung cancer, presented with right upper lobe lung nodule and mediastinal lymphadenopathy, diagnosed in January 2021. Completed a course of concurrent chemoradiation with weekly carboplatin and paclitaxel for 5 cycles 3/21.  Currently receiving consolidation immunotherapy with Imfinzi every 4 weeks with a recent CT scan shows interval increase in pulmonary nodules. She will start additional radiation next week.  She continues to smoke.   She was seen in consultation for dysphagia 06/05/2020. Reported odynophagia and intermittent dysphagia that started during radiation. Had improved, but, worsened over the last few months. Occasionally feels like she is swallowing glass. Food bolus at the sternal notch and will slowly ease down. No change with eating small bites.  No heartburn, dysphonia, neck  pain, sore throat, change in bowel habits.   No improvement with Carafate.   PET CT 04/14/19: upper lobe nodule right paratracheal, right hilar, and subcarinal adenopathy Chest CT 03/26/20: New pulmonary nodule, unchaged right pulmonary nodules, stable mediastinal LAD.  EGD 06/26/2020 showed severe esophagitis from 20 to 27 cm from the incisors.  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  There was chronic mild gastritis and peptic duodenitis.  Biopsies were negative for H. pylori.  She returns today in follow-up.  Following the procedure I increased her pantoprazole to 40 mg twice daily. She did not like the Carafate because it caused nausea.  On this regimen she is having much less pain but continues to have dysphagia.  She is about to start additional radiation but she understands they are working to avoid her esophagus.    Past Medical History:  Diagnosis Date  . COPD (chronic obstructive pulmonary disease) (HCC)    emphysema  . Hyperlipidemia   . Hypertension   . lung ca dx'd 03/2019  . Lung nodule   . Paroxysmal atrial flutter ([place])    per cardiology note  . Peripheral artery disease ([place])   . Tobacco abuse   . Vitamin D deficiency   . Wears glasses     Past Surgical History:  Procedure Laterality Date  . BRONCHIAL NEEDLE ASPIRATION BIOPSY  04/12/2019   Procedure: BRONCHIAL NEEDLE ASPIRATION BIOPSIES;  Surgeon: Garner Nash, DO;  Location: [facility];  Service: Cardiopulmonary;;  . ENDOBRONCHIAL ULTRASOUND N/A 04/12/2019   Procedure: ENDOBRONCHIAL ULTRASOUND;  Surgeon: Garner Nash, DO;  Location: [facility];  Service: Cardiopulmonary;  Laterality: N/A;  . OVARIAN CYST REMOVAL    . TONSILLECTOMY    . TUBAL  LIGATION    . VIDEO BRONCHOSCOPY N/A 04/12/2019   Procedure: VIDEO BRONCHOSCOPY WITHOUT FLUORO;  Surgeon: Garner Nash, DO;  Location: [facility];  Service: Cardiopulmonary;  Laterality: N/A;    Current Outpatient Medications   Medication Sig Dispense Refill  . aspirin EC 81 MG tablet Take 1 tablet (81 mg total) by mouth daily. 90 tablet 3  . Cholecalciferol (DIALYVITE VITAMIN D 5000) 125 MCG (5000 UT) capsule Take 5,000 Units by mouth daily.    Marland Kitchen escitalopram (LEXAPRO) 10 MG tablet Take 10 mg by mouth daily.    Marland Kitchen guaiFENesin (MUCINEX) 600 MG 12 hr tablet Take by mouth 2 (two) times daily.    . pantoprazole (PROTONIX) 40 MG tablet Take 1 tablet (40 mg total) by mouth 2 (two) times daily. 180 tablet 3  . rosuvastatin (CRESTOR) 20 MG tablet Take 20 mg by mouth every evening.     . sucralfate (CARAFATE) 1 GM/10ML suspension Take 10 mLs (1 g total) by mouth 4 (four) times daily -  with meals and at bedtime. 420 mL 2  . Tiotropium Bromide-Olodaterol (STIOLTO RESPIMAT) 2.5-2.5 MCG/ACT AERS Inhale 2 puffs into the lungs daily. 4 g 0  . vitamin B-12 (CYANOCOBALAMIN) 1000 MCG tablet Take 1,000 mcg by mouth daily.     No current facility-administered medications for this visit.    Allergies as of 08/23/2020  . (No Known Allergies)       Physical Exam: General:   Alert,  well-nourished, pleasant and cooperative in NAD Head:  Normocephalic and atraumatic. Eyes:  Sclera clear, no icterus.   Conjunctiva pink. Abdomen:  Soft, nontender, nondistended, normal bowel sounds, no rebound or guarding. No hepatosplenomegaly.   Neurologic:  Alert and  oriented x4;  grossly nonfocal Skin:  Intact without significant lesions or rashes. Psych:  Alert and cooperative. Normal mood and affect.    Betty Seidner L. Tarri Glenn, MD, MPH 08/23/2020, 9:46 AM

## 2020-08-28 DIAGNOSIS — C3411 Malignant neoplasm of upper lobe, right bronchus or lung: Secondary | ICD-10-CM | POA: Diagnosis not present

## 2020-08-29 ENCOUNTER — Ambulatory Visit
Admission: RE | Admit: 2020-08-29 | Discharge: 2020-08-29 | Disposition: A | Discharge status: Home / Self Care | Payer: Medicare Other | Source: Ambulatory Visit | Attending: Radiation Oncology | Admitting: Radiation Oncology

## 2020-08-29 ENCOUNTER — Other Ambulatory Visit: Payer: Self-pay

## 2020-08-29 DIAGNOSIS — C3411 Malignant neoplasm of upper lobe, right bronchus or lung: Secondary | ICD-10-CM | POA: Diagnosis not present

## 2020-08-30 ENCOUNTER — Ambulatory Visit: Payer: Medicare Other | Admitting: Radiation Oncology

## 2020-08-31 ENCOUNTER — Other Ambulatory Visit: Payer: Self-pay

## 2020-08-31 ENCOUNTER — Ambulatory Visit
Admission: RE | Admit: 2020-08-31 | Discharge: 2020-08-31 | Disposition: A | Discharge status: Home / Self Care | Payer: Medicare Other | Source: Ambulatory Visit | Attending: Radiation Oncology | Admitting: Radiation Oncology

## 2020-08-31 DIAGNOSIS — C3411 Malignant neoplasm of upper lobe, right bronchus or lung: Secondary | ICD-10-CM | POA: Diagnosis not present

## 2020-09-03 ENCOUNTER — Other Ambulatory Visit: Payer: Self-pay

## 2020-09-03 ENCOUNTER — Ambulatory Visit
Admission: RE | Admit: 2020-09-03 | Discharge: 2020-09-03 | Disposition: A | Discharge status: Home / Self Care | Payer: Medicare Other | Source: Ambulatory Visit | Attending: Radiation Oncology | Admitting: Radiation Oncology

## 2020-09-03 DIAGNOSIS — C3411 Malignant neoplasm of upper lobe, right bronchus or lung: Secondary | ICD-10-CM | POA: Diagnosis not present

## 2020-09-04 ENCOUNTER — Ambulatory Visit: Payer: Medicare Other | Admitting: Radiation Oncology

## 2020-09-05 ENCOUNTER — Ambulatory Visit
Admission: RE | Admit: 2020-09-05 | Discharge: 2020-09-05 | Disposition: A | Discharge status: Home / Self Care | Payer: Medicare Other | Source: Ambulatory Visit | Attending: Radiation Oncology | Admitting: Radiation Oncology

## 2020-09-05 ENCOUNTER — Other Ambulatory Visit: Payer: Self-pay

## 2020-09-05 DIAGNOSIS — C3411 Malignant neoplasm of upper lobe, right bronchus or lung: Secondary | ICD-10-CM | POA: Diagnosis not present

## 2020-09-07 ENCOUNTER — Other Ambulatory Visit: Payer: Self-pay

## 2020-09-07 ENCOUNTER — Encounter: Payer: Self-pay | Admitting: Radiation Oncology

## 2020-09-07 ENCOUNTER — Ambulatory Visit
Admission: RE | Admit: 2020-09-07 | Discharge: 2020-09-07 | Disposition: A | Discharge status: Home / Self Care | Payer: Medicare Other | Source: Ambulatory Visit | Attending: Radiation Oncology | Admitting: Radiation Oncology

## 2020-09-07 DIAGNOSIS — C3411 Malignant neoplasm of upper lobe, right bronchus or lung: Secondary | ICD-10-CM | POA: Diagnosis not present

## 2020-09-10 ENCOUNTER — Encounter: Payer: Self-pay | Admitting: Internal Medicine

## 2020-09-10 NOTE — Progress Notes (Signed)
                                                                                                                                                        Patient Name: ALFREDIA [facility] MRN: [id number] DOB: [birth year] Referring Physician: Curt Bears (Profile Not Attached) Date of Service: 09/07/2020 [facility], [place]                                                        End Of Treatment Note  Diagnoses: C34.11-Malignant neoplasm of upper lobe, right bronchus or lung  Cancer Staging:  Progressive Stage IIIA, [id number], NSCLC, cannot further classify of the RUL  Intent: Curative  Radiation Treatment Dates: 08/29/2020 through 09/07/2020 SBRT Treatment Site Technique Total Dose (Gy) Dose per Fx (Gy) Completed Fx Beam Energies  Lung, Right: Lung_Rt IMRT 60/60 12 5/5 6XFFF  Lung, Right: Lung_Rt_PTV Low IMRT 60/60 12 5/5 6XFFF   Narrative: The patient tolerated radiation therapy relatively well.   Plan: The patient will receive a call in about one month from the radiation oncology department. She will continue follow up with Dr. Julien Nordmann as well.   ________________________________________________    Carola Rhine, [facility]

## 2020-10-22 ENCOUNTER — Other Ambulatory Visit: Payer: Self-pay

## 2020-10-22 ENCOUNTER — Inpatient Hospital Stay: Payer: Medicare Other | Attending: Internal Medicine

## 2020-10-22 ENCOUNTER — Ambulatory Visit
Admission: RE | Admit: 2020-10-22 | Discharge: 2020-10-22 | Disposition: A | Discharge status: Home / Self Care | Payer: Medicare Other | Source: Ambulatory Visit | Attending: Internal Medicine | Admitting: Internal Medicine

## 2020-10-22 DIAGNOSIS — R5383 Other fatigue: Secondary | ICD-10-CM | POA: Insufficient documentation

## 2020-10-22 DIAGNOSIS — Z79899 Other long term (current) drug therapy: Secondary | ICD-10-CM | POA: Diagnosis not present

## 2020-10-22 DIAGNOSIS — J432 Centrilobular emphysema: Secondary | ICD-10-CM | POA: Diagnosis not present

## 2020-10-22 DIAGNOSIS — C3490 Malignant neoplasm of unspecified part of unspecified bronchus or lung: Secondary | ICD-10-CM

## 2020-10-22 DIAGNOSIS — C3411 Malignant neoplasm of upper lobe, right bronchus or lung: Secondary | ICD-10-CM

## 2020-10-22 DIAGNOSIS — F17210 Nicotine dependence, cigarettes, uncomplicated: Secondary | ICD-10-CM | POA: Insufficient documentation

## 2020-10-22 DIAGNOSIS — Z923 Personal history of irradiation: Secondary | ICD-10-CM | POA: Insufficient documentation

## 2020-10-22 DIAGNOSIS — C771 Secondary and unspecified malignant neoplasm of intrathoracic lymph nodes: Secondary | ICD-10-CM | POA: Diagnosis not present

## 2020-10-22 DIAGNOSIS — Z9221 Personal history of antineoplastic chemotherapy: Secondary | ICD-10-CM | POA: Insufficient documentation

## 2020-10-22 DIAGNOSIS — I700 Atherosclerosis of aorta: Secondary | ICD-10-CM | POA: Insufficient documentation

## 2020-10-22 DIAGNOSIS — R0609 Other forms of dyspnea: Secondary | ICD-10-CM | POA: Insufficient documentation

## 2020-10-22 LAB — CBC WITH DIFFERENTIAL (CANCER CENTER ONLY)
Abs Immature Granulocytes: 0.02 10*3/uL (ref 0.00–0.07)
Basophils Absolute: 0.1 10*3/uL (ref 0.0–0.1)
Basophils Relative: 1 %
Eosinophils Absolute: 0.2 10*3/uL (ref 0.0–0.5)
Eosinophils Relative: 3 %
HCT: 35.4 % — ABNORMAL LOW (ref 36.0–46.0)
Hemoglobin: 12.1 g/dL (ref 12.0–15.0)
Immature Granulocytes: 0 %
Lymphocytes Relative: 13 %
Lymphs Abs: 0.7 10*3/uL (ref 0.7–4.0)
MCH: 32 pg (ref 26.0–34.0)
MCHC: 34.2 g/dL (ref 30.0–36.0)
MCV: 93.7 fL (ref 80.0–100.0)
Monocytes Absolute: 0.5 10*3/uL (ref 0.1–1.0)
Monocytes Relative: 8 %
Neutro Abs: 4.1 10*3/uL (ref 1.7–7.7)
Neutrophils Relative %: 75 %
Platelet Count: 264 10*3/uL (ref 150–400)
RBC: 3.78 MIL/uL — ABNORMAL LOW (ref 3.87–5.11)
RDW: 14.3 % (ref 11.5–15.5)
WBC Count: 5.6 10*3/uL (ref 4.0–10.5)
nRBC: 0 % (ref 0.0–0.2)

## 2020-10-22 LAB — CMP (CANCER CENTER ONLY)
ALT: 9 U/L (ref 0–44)
AST: 12 U/L — ABNORMAL LOW (ref 15–41)
Albumin: 3.4 g/dL — ABNORMAL LOW (ref 3.5–5.0)
Alkaline Phosphatase: 89 U/L (ref 38–126)
Anion gap: 8 (ref 5–15)
BUN: 13 mg/dL (ref 8–23)
CO2: 24 mmol/L (ref 22–32)
Calcium: 9.2 mg/dL (ref 8.9–10.3)
Chloride: 106 mmol/L (ref 98–111)
Creatinine: 1.31 mg/dL — ABNORMAL HIGH (ref 0.44–1.00)
GFR, Estimated: 45 mL/min — ABNORMAL LOW (ref >60)
Glucose, Bld: 78 mg/dL (ref 70–99)
Potassium: 4.2 mmol/L (ref 3.5–5.1)
Sodium: 138 mmol/L (ref 135–145)
Total Bilirubin: 0.3 mg/dL (ref 0.3–1.2)
Total Protein: 7.5 g/dL (ref 6.5–8.1)

## 2020-10-22 NOTE — Progress Notes (Signed)
  Radiation Oncology         [phone] ________________________________  Name: Betty Anderson MRN: [id number]  Date of Service: 10/22/2020  DOB: 09-09-1955  Post Treatment Telephone Note  Diagnosis:    Progressive Stage IIIA, [id number], NSCLC, cannot further classify of the RUL  Interval Since Last Radiation:  7 weeks   08/29/2020 through 09/07/2020 SBRT Treatment Site Technique Total Dose (Gy) Dose per Fx (Gy) Completed Fx Beam Energies  Lung, Right: Lung_Rt RUL IMRT 60/60 12 5/5 6XFFF  Lung, Right: Lung_Rt_PTV Low 2lesions in the RUL IMRT 60/60 12 5/5 6XFFF    Narrative:  The patient was contacted today for routine follow-up. During treatment she did very well with radiotherapy and did not have significant desquamation. She reports she is doing well without difficulty with swallowing but some fatigue which is ongoing.  Impression/Plan: 1.  Progressive Stage IIIA, cT1bN2M0, NSCLC, cannot further classify of the RUL. The patient has been doing well since completion of radiotherapy. We discussed that we would be happy to continue to follow her as needed, but she will also continue to follow up with Dr. Julien Nordmann  in medical oncology.      Carola Rhine, PAC

## 2020-10-23 ENCOUNTER — Ambulatory Visit (HOSPITAL_COMMUNITY)
Admission: RE | Admit: 2020-10-23 | Discharge: 2020-10-23 | Disposition: A | Discharge status: Home / Self Care | Payer: Medicare Other | Source: Ambulatory Visit | Attending: Internal Medicine | Admitting: Internal Medicine

## 2020-10-23 DIAGNOSIS — C3490 Malignant neoplasm of unspecified part of unspecified bronchus or lung: Secondary | ICD-10-CM | POA: Diagnosis not present

## 2020-10-23 MED ORDER — IOHEXOL 350 MG/ML SOLN
100.0000 mL | Freq: Once | INTRAVENOUS | Status: AC | PRN
  Administered 2020-10-23: 60 mL via INTRAVENOUS

## 2020-10-24 ENCOUNTER — Other Ambulatory Visit: Payer: Self-pay

## 2020-10-24 ENCOUNTER — Inpatient Hospital Stay (HOSPITAL_BASED_OUTPATIENT_CLINIC_OR_DEPARTMENT_OTHER): Payer: Medicare Other | Admitting: Internal Medicine

## 2020-10-24 VITALS — BP 114/81 | HR 88 | Temp 98.0°F | Resp 18 | Ht 63.0 in | Wt 161.5 lb

## 2020-10-24 DIAGNOSIS — C3490 Malignant neoplasm of unspecified part of unspecified bronchus or lung: Secondary | ICD-10-CM | POA: Diagnosis not present

## 2020-10-24 DIAGNOSIS — C3411 Malignant neoplasm of upper lobe, right bronchus or lung: Secondary | ICD-10-CM

## 2020-10-24 NOTE — Progress Notes (Signed)
[facility] [phone]   Fax:([phone]) [place], [place] [address]  DIAGNOSIS: Stage IIIA (T1b, N2, M0) non-small cell lung cancer presented with right upper lobe lung nodule in addition to mediastinal lymphadenopathy diagnosed in January 2021.  PRIOR THERAPY:   1) Concurrent chemoradiation with weekly carboplatin for AUC of 2 and paclitaxel 45 NG/M2.  Status post 5 cycles. Last dose received on 05/30/19. 2) Consolidation immunotherapy with Imfinzi 1500 mg/kg IV every 4 weeks.  First dose expected on 07/21/2019. Status post 13 cycles. 3) SBRT to the large right upper lobe pulmonary nodules under the care of Dr. Lisbeth Renshaw.   CURRENT THERAPY: Observation.  INTERVAL HISTORY: Betty Anderson 65 y.o. female returns to the clinic today for follow-up visit accompanied by her husband.  The patient is feeling fine today with no concerning complaints except for mild cough.  She continues to smoke at regular basis.  She tolerated the SBRT to the large right upper lobe pulmonary nodules fairly well under the care of Dr. Lisbeth Renshaw.  She denied having any current chest pain but has shortness of breath with exertion with no hemoptysis.  She denied having any fever or chills.  She has no nausea, vomiting, diarrhea or constipation.  She has no headache or visual changes.  She denied having any recent weight loss or night sweats.  The patient had repeat CT scan of the chest performed recently and she is here for evaluation and discussion of her risk her results.  MEDICAL HISTORY: Past Medical History:  Diagnosis Date   COPD (chronic obstructive pulmonary disease) ([place])    emphysema   Hyperlipidemia    Hypertension    lung ca dx'd 03/2019   Lung nodule    Paroxysmal atrial flutter (HCC)    per cardiology note   Peripheral artery disease ([place])    Tobacco abuse    Vitamin D deficiency    Wears glasses     ALLERGIES:  has  No Known Allergies.  MEDICATIONS:  Current Outpatient Medications  Medication Sig Dispense Refill   aspirin EC 81 MG tablet Take 1 tablet (81 mg total) by mouth daily. 90 tablet 3   Cholecalciferol (DIALYVITE VITAMIN D 5000) 125 MCG (5000 UT) capsule Take 5,000 Units by mouth daily.     escitalopram (LEXAPRO) 10 MG tablet Take 10 mg by mouth daily.     guaiFENesin (MUCINEX) 600 MG 12 hr tablet Take by mouth 2 (two) times daily.     pantoprazole (PROTONIX) 40 MG tablet Take 1 tablet (40 mg total) by mouth 2 (two) times daily. 180 tablet 3   rosuvastatin (CRESTOR) 20 MG tablet Take 20 mg by mouth every evening.      sucralfate (CARAFATE) 1 GM/10ML suspension Take 10 mLs (1 g total) by mouth 4 (four) times daily -  with meals and at bedtime. 420 mL 2   Tiotropium Bromide-Olodaterol (STIOLTO RESPIMAT) 2.5-2.5 MCG/ACT AERS Inhale 2 puffs into the lungs daily. 4 g 0   vitamin B-12 (CYANOCOBALAMIN) 1000 MCG tablet Take 1,000 mcg by mouth daily.     No current facility-administered medications for this visit.    SURGICAL HISTORY:  Past Surgical History:  Procedure Laterality Date   BRONCHIAL NEEDLE ASPIRATION BIOPSY  04/12/2019   Procedure: BRONCHIAL NEEDLE ASPIRATION BIOPSIES;  Surgeon: Garner Nash, DO;  Location: [facility];  Service: Cardiopulmonary;;   ENDOBRONCHIAL ULTRASOUND N/A 04/12/2019   Procedure: ENDOBRONCHIAL  ULTRASOUND;  Surgeon: Garner Nash, DO;  Location: [facility];  Service: Cardiopulmonary;  Laterality: N/A;   OVARIAN CYST REMOVAL     TONSILLECTOMY     TUBAL LIGATION     VIDEO BRONCHOSCOPY N/A 04/12/2019   Procedure: VIDEO BRONCHOSCOPY WITHOUT FLUORO;  Surgeon: Garner Nash, DO;  Location: [facility];  Service: Cardiopulmonary;  Laterality: N/A;    REVIEW OF SYSTEMS:  A comprehensive review of systems was negative except for: Constitutional: positive for fatigue Respiratory: positive for cough and dyspnea on exertion   PHYSICAL EXAMINATION: General  appearance: alert, cooperative, fatigued, and no distress Head: Normocephalic, without obvious abnormality, atraumatic Neck: no adenopathy, no JVD, supple, symmetrical, trachea midline, and thyroid not enlarged, symmetric, no tenderness/mass/nodules Lymph nodes: Cervical, supraclavicular, and axillary nodes normal. Resp: clear to auscultation bilaterally Back: symmetric, no curvature. ROM normal. No CVA tenderness. Cardio: regular rate and rhythm, S1, S2 normal, no murmur, click, rub or gallop GI: soft, non-tender; bowel sounds normal; no masses,  no organomegaly Extremities: extremities normal, atraumatic, no cyanosis or edema  ECOG PERFORMANCE STATUS: 1 - Symptomatic but completely ambulatory  Blood pressure 114/81, pulse 88, temperature 98 F (36.7 C), temperature source Tympanic, resp. rate 18, height 5\' 3"  (1.6 m), weight 161 lb 8 oz (73.3 kg), SpO2 99 %.  LABORATORY DATA: Lab Results  Component Value Date   WBC 5.6 10/22/2020   HGB 12.1 10/22/2020   HCT 35.4 (L) 10/22/2020   MCV 93.7 10/22/2020   PLT 264 10/22/2020      Chemistry      Component Value Date/Time   NA 138 10/22/2020 0948   K 4.2 10/22/2020 0948   CL 106 10/22/2020 0948   CO2 24 10/22/2020 0948   BUN 13 10/22/2020 0948   CREATININE 1.31 (H) 10/22/2020 0948      Component Value Date/Time   CALCIUM 9.2 10/22/2020 0948   ALKPHOS 89 10/22/2020 0948   AST 12 (L) 10/22/2020 0948   ALT 9 10/22/2020 0948   BILITOT 0.3 10/22/2020 0948       RADIOGRAPHIC STUDIES: CT Chest W Contrast  Result Date: 10/24/2020 CLINICAL DATA:  Lung cancer, finished radiation therapy 6 weeks ago. Chemotherapy and immunotherapy complete. EXAM: CT CHEST WITH CONTRAST TECHNIQUE: Multidetector CT imaging of the chest was performed during intravenous contrast administration. CONTRAST:  56mL OMNIPAQUE IOHEXOL 350 MG/ML SOLN COMPARISON:  07/20/2020. FINDINGS: Cardiovascular: Atherosclerotic calcification of the aorta and coronary  arteries. Associated narrowing of the right brachiocephalic and left common carotid arteries. Heart size normal. Small amount of pericardial fluid is likely physiologic. Mediastinum/Nodes: Mediastinal and hilar lymph nodes are not enlarged by CT size criteria. No axillary adenopathy. Esophagus is unremarkable. Lungs/Pleura: Centrilobular and paraseptal emphysema. Subpleural apical segment right upper lobe nodule measures 5 mm (7/15), decreased from 6 mm. 5 mm peripheral right upper lobe nodule (7/25), similar to minimally enlarged, previously measuring 3 mm. Two additional nodules in the central right upper lobe measure 10 mm (7/30) and 11 mm (7/30), grossly stable from 07/20/2020. Associated ground-glass in the right upper lobe. Mosaic pulmonary parenchymal attenuation is nonspecific in the absence of expiratory phase imaging. No pleural fluid. Airway is unremarkable. Upper Abdomen: Visualized portions of the liver, gallbladder, adrenal glands and right kidney are unremarkable. 1.6 cm low-attenuation lesion off the lateral left kidney, incompletely visualized. Visualized portions of the spleen, pancreas, stomach and bowel are grossly unremarkable. No upper abdominal adenopathy. Musculoskeletal: Degenerative changes in the spine. No worrisome lytic or sclerotic lesions. IMPRESSION: 1.  Slight mixed response to therapy of nodules in the right upper lobe, with associated post radiation treatment affects. 2. Aortic atherosclerosis (ICD10-I70.0). Coronary artery calcification. Associated narrowing of the right brachiocephalic and left carotid arteries. 3.  Emphysema (ICD10-J43.9). Electronically Signed   By: Lorin Picket M.D.   On: 10/24/2020 09:21     ASSESSMENT AND PLAN: This is a very pleasant 65 years old white female with stage IIIA non-small cell lung cancer, presented with right upper lobe lung nodule and mediastinal lymphadenopathy diagnosed in January 2021.   She completed a course of concurrent  chemoradiation with weekly carboplatin and paclitaxel for 5 cycles and tolerated her treatment well.  She has partial response. The patient underwent consolidation treatment with Imfinzi 1500 mg IV every 4 weeks status post 13 cycles.  She tolerated this treatment well with no concerning adverse effects. She also underwent SBRT to enlarging right upper lobe pulmonary nodules under the care of Dr. Lisbeth Renshaw. The patient had repeat CT scan of the chest performed recently.  I personally and independently reviewed the scans and discussed the results with the patient and her husband. I scan showed stable disease with some improvement of the right upper lobe nodule that has been treated with SBRT and slight increase in another pulmonary nodule. I recommended for the patient to continue on observation with repeat CT scan of the chest in 3 months. For the smoking, I strongly encouraged the patient to quit smoking. The patient was advised to call immediately if she has any concerning symptoms in the interval. The patient voices understanding of current disease status and treatment options and is in agreement with the current care plan.  All questions were answered. The patient knows to call the clinic with any problems, questions or concerns. We can certainly see the patient much sooner if necessary.   Disclaimer: This note was dictated with voice recognition software. Similar sounding words can inadvertently be transcribed and may not be corrected upon review.

## 2020-10-26 ENCOUNTER — Telehealth: Payer: Self-pay | Admitting: Internal Medicine

## 2020-10-26 NOTE — Telephone Encounter (Signed)
Scheduled per los. Called and left msg. Mailed printout  °

## 2020-12-09 IMAGING — MR MR HEAD WO/W CM
10 of 14 series · 30 of 48 positions shown · IV contrast (gadavist)
Comparison: None.

CLINICAL DATA: Recently diagnosed with non-small cell lung cancer.
Rule out metastases.

EXAM:
MRI HEAD WITHOUT AND WITH CONTRAST
TECHNIQUE: Multiplanar, multiecho pulse sequences of the brain and surrounding
structures were obtained without and with intravenous contrast.
CONTRAST:  7.5mL GADAVIST GADOBUTROL 1 MMOL/ML IV SOLN

[Series 3: DWI · axial · 3.0mm · 1.09mm/px · z∈[-24,+119]mm · 6 of 98 slices shown (1 of 4)]
[im 1/98]
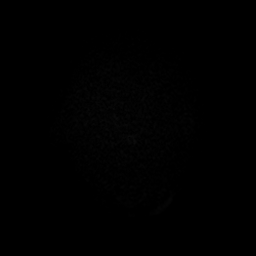
[im 20/98]
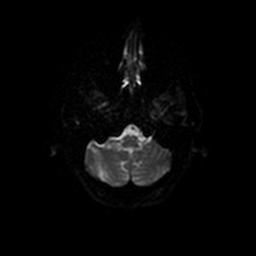
[im 39/98]
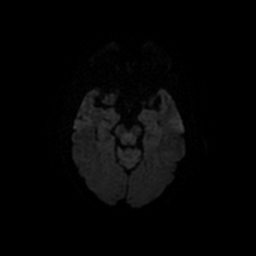
[im 59/98]
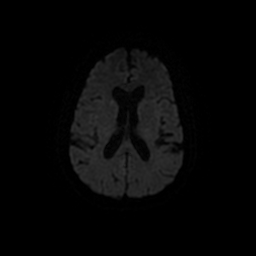
[im 78/98]
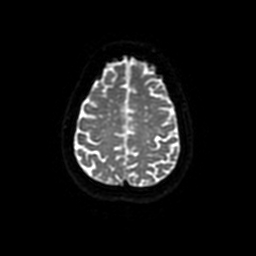
[im 98/98]
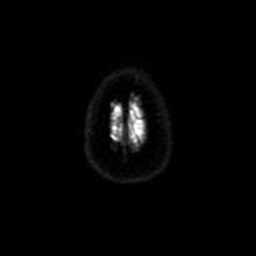

[Series 5: T2 · axial · 5.0mm · 0.43mm/px · 1 of 21 slices shown]
[im 1/21]
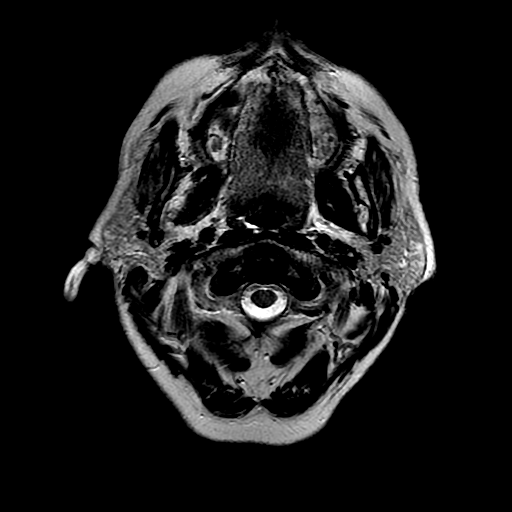

[Series 6: FLAIR · axial · 3.0mm · 0.43mm/px · 1 of 25 slices shown]
[im 1/25]
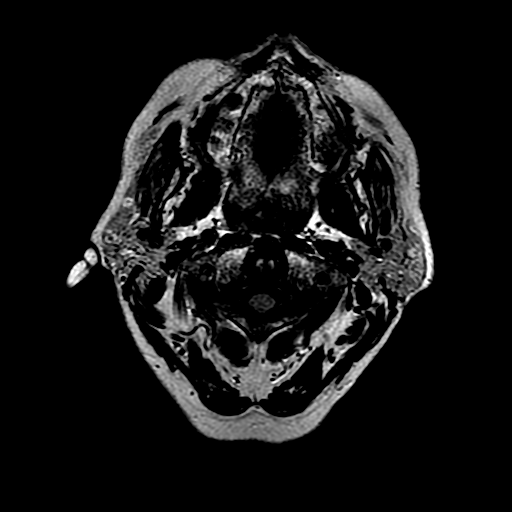

[Series 10: DWI · coronal · 4.0mm · 1.09mm/px · 5 of 84 slices shown (2 of 4)]
[im 1/84]
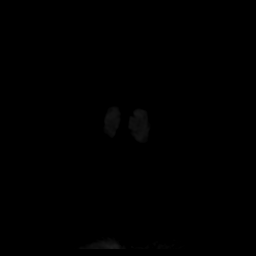
[im 21/84]
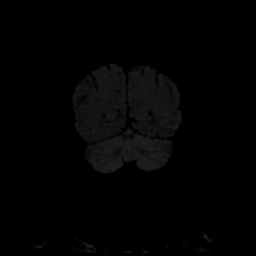
[im 42/84]
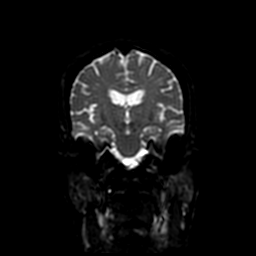
[im 63/84]
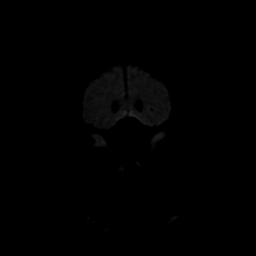
[im 84/84]
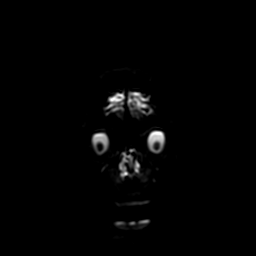

[Series 11: T2 post-contrast · coronal · 5.0mm · 0.45mm/px · 1 of 26 slices shown]
[im 1/26]
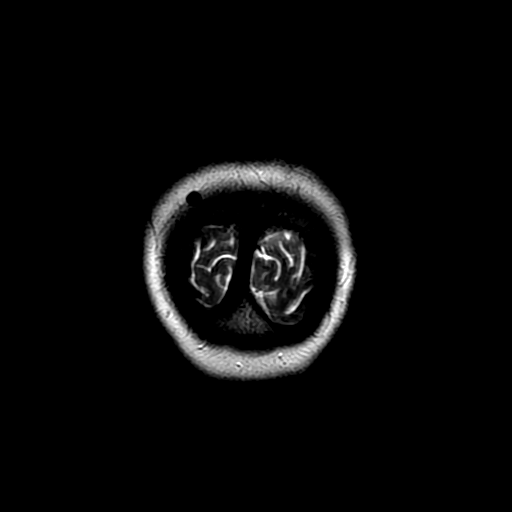

[Series 12: T1 post-contrast · axial · 1.0mm · 0.47mm/px · z∈[-17,+121]mm · 8 of 140 slices shown (1 of 3)]
[im 1/140]
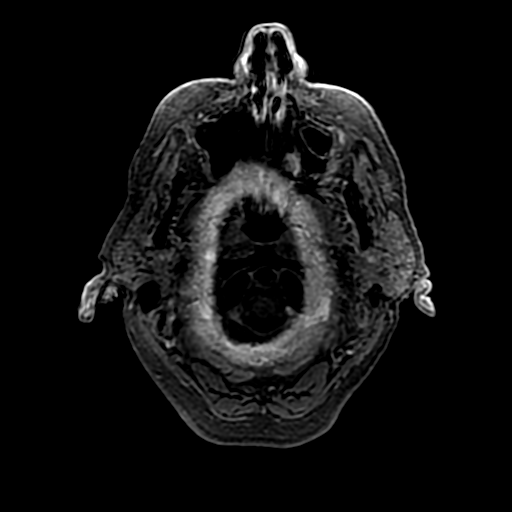
[im 20/140]
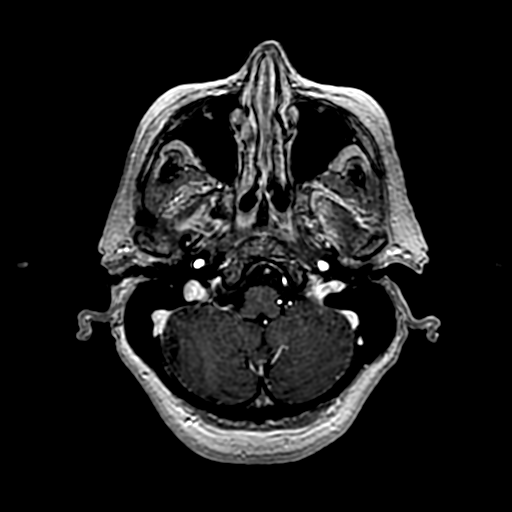
[im 40/140]
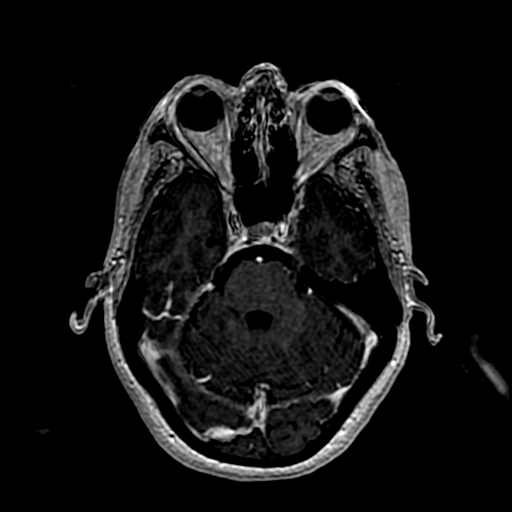
[im 60/140]
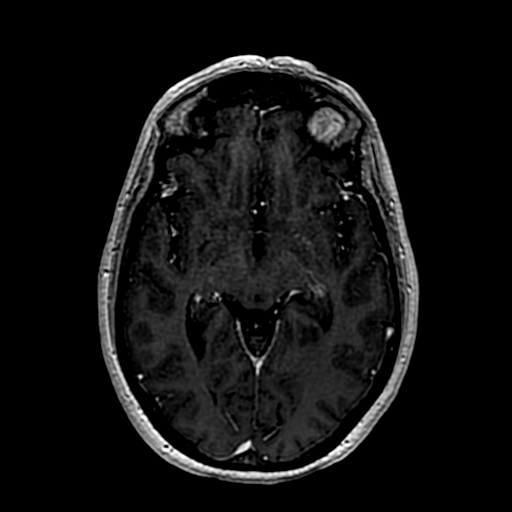
[im 80/140]
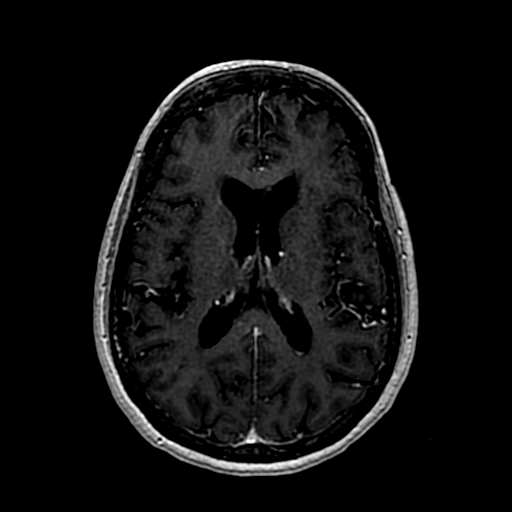
[im 100/140]
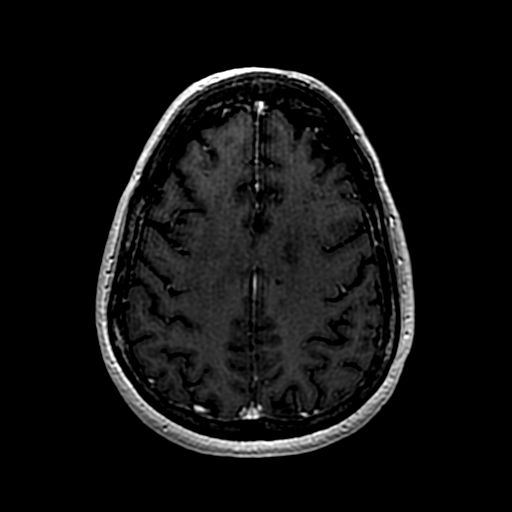
[im 120/140]
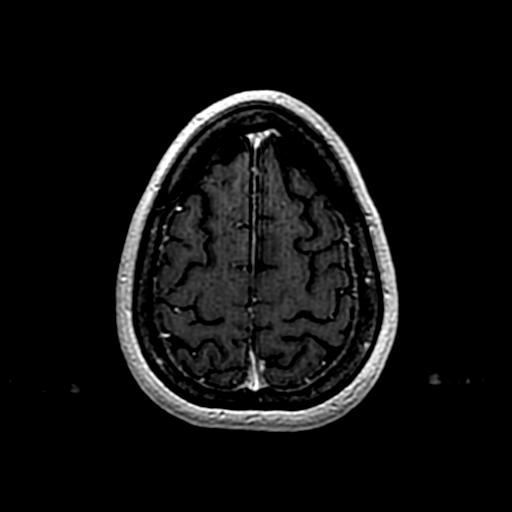
[im 140/140]
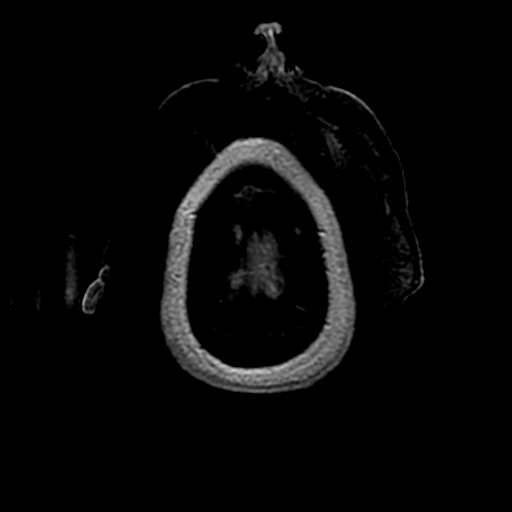

[Series 13: T1 post-contrast · coronal · 5.0mm · 0.43mm/px · 2 of 28 slices shown (2 of 3)]
[im 1/28]
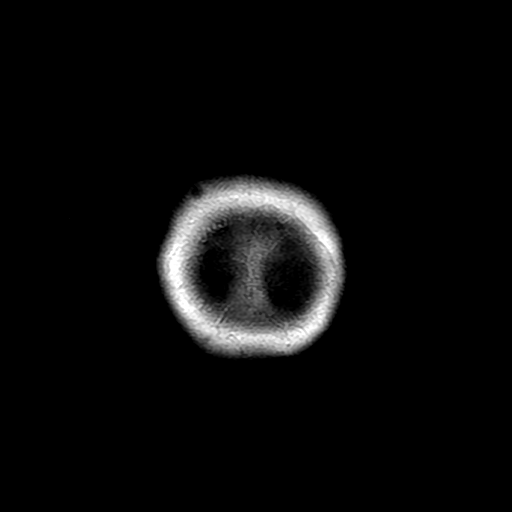
[im 28/28]
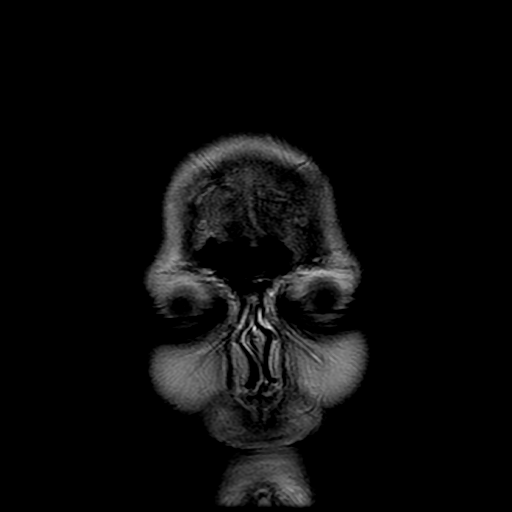

[Series 14: T1 post-contrast · sagittal · 5.0mm · 0.47mm/px · 1 of 23 slices shown (3 of 3)]
[im 1/23]
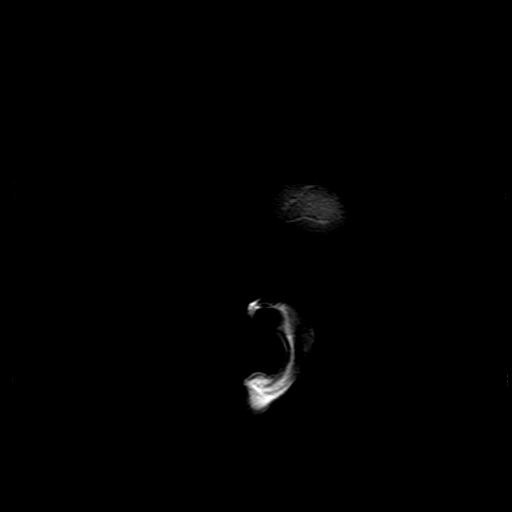

[Series 301: DWI · axial · 3.0mm · 1.09mm/px · z∈[-24,+119]mm · 3 of 49 slices shown (3 of 4)]
[im 1/49]
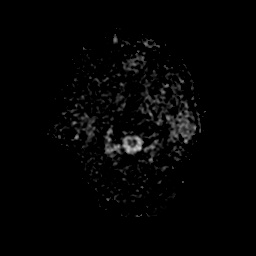
[im 25/49]
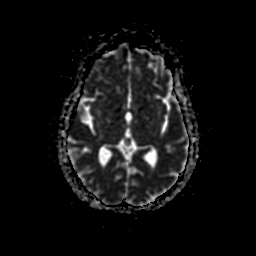
[im 49/49]
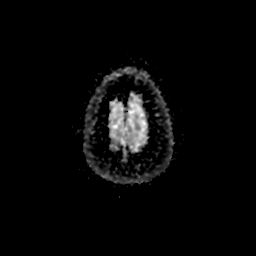

[Series 1001: DWI · coronal · 4.0mm · 1.09mm/px · 2 of 42 slices shown (4 of 4)]
[im 1/42]
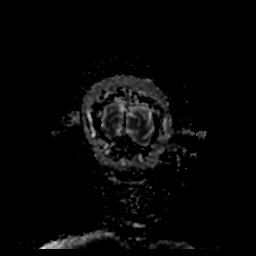
[im 42/42]
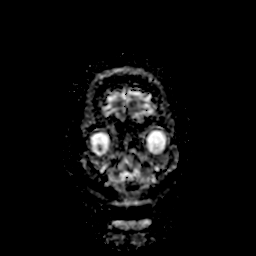

[30 of 48 positions shown; findings below may reference images not displayed]

FINDINGS: Brain: No acute infarction, hemorrhage, hydrocephalus, extra-axial
collection or mass lesion. Small scattered foci of T2 hyperintensity
are seen in the white matter of the cerebral hemispheres, likely
related to mild chronic small vessel disease.

Vascular: Normal flow voids.

Skull and upper cervical spine: Normal marrow signal.

Sinuses/Orbits: Negative.

Other: Partial empty sella is incidentally noted.

The study is partially degraded by motion artifact.
IMPRESSION: 1. No acute intracranial abnormality. No evidence of intracranial
metastatic disease.
2. Mild chronic small vessel disease.

## 2021-01-17 ENCOUNTER — Encounter: Payer: Self-pay | Admitting: Internal Medicine

## 2021-01-22 ENCOUNTER — Other Ambulatory Visit: Payer: Self-pay

## 2021-01-22 ENCOUNTER — Other Ambulatory Visit: Payer: Medicare Other

## 2021-01-22 ENCOUNTER — Inpatient Hospital Stay: Payer: Medicare Other | Attending: Internal Medicine

## 2021-01-22 ENCOUNTER — Ambulatory Visit (HOSPITAL_COMMUNITY)
Admission: RE | Admit: 2021-01-22 | Discharge: 2021-01-22 | Disposition: A | Discharge status: Home / Self Care | Payer: Medicare Other | Source: Ambulatory Visit | Attending: Internal Medicine | Admitting: Internal Medicine

## 2021-01-22 DIAGNOSIS — C3490 Malignant neoplasm of unspecified part of unspecified bronchus or lung: Secondary | ICD-10-CM

## 2021-01-22 DIAGNOSIS — F17200 Nicotine dependence, unspecified, uncomplicated: Secondary | ICD-10-CM | POA: Diagnosis not present

## 2021-01-22 DIAGNOSIS — C3411 Malignant neoplasm of upper lobe, right bronchus or lung: Secondary | ICD-10-CM | POA: Insufficient documentation

## 2021-01-22 DIAGNOSIS — Z923 Personal history of irradiation: Secondary | ICD-10-CM | POA: Insufficient documentation

## 2021-01-22 DIAGNOSIS — R059 Cough, unspecified: Secondary | ICD-10-CM | POA: Insufficient documentation

## 2021-01-22 DIAGNOSIS — R0609 Other forms of dyspnea: Secondary | ICD-10-CM | POA: Insufficient documentation

## 2021-01-22 DIAGNOSIS — Z79899 Other long term (current) drug therapy: Secondary | ICD-10-CM | POA: Insufficient documentation

## 2021-01-22 DIAGNOSIS — R590 Localized enlarged lymph nodes: Secondary | ICD-10-CM | POA: Diagnosis not present

## 2021-01-22 LAB — CMP (CANCER CENTER ONLY)
ALT: 7 U/L (ref 0–44)
AST: 11 U/L — ABNORMAL LOW (ref 15–41)
Albumin: 3.2 g/dL — ABNORMAL LOW (ref 3.5–5.0)
Alkaline Phosphatase: 97 U/L (ref 38–126)
Anion gap: 9 (ref 5–15)
BUN: 17 mg/dL (ref 8–23)
CO2: 21 mmol/L — ABNORMAL LOW (ref 22–32)
Calcium: 9 mg/dL (ref 8.9–10.3)
Chloride: 108 mmol/L (ref 98–111)
Creatinine: 1.42 mg/dL — ABNORMAL HIGH (ref 0.44–1.00)
GFR, Estimated: 41 mL/min — ABNORMAL LOW (ref >60)
Glucose, Bld: 116 mg/dL — ABNORMAL HIGH (ref 70–99)
Potassium: 3.7 mmol/L (ref 3.5–5.1)
Sodium: 138 mmol/L (ref 135–145)
Total Bilirubin: 0.3 mg/dL (ref 0.3–1.2)
Total Protein: 7.4 g/dL (ref 6.5–8.1)

## 2021-01-22 LAB — CBC WITH DIFFERENTIAL (CANCER CENTER ONLY)
Abs Immature Granulocytes: 0.02 10*3/uL (ref 0.00–0.07)
Basophils Absolute: 0 10*3/uL (ref 0.0–0.1)
Basophils Relative: 1 %
Eosinophils Absolute: 0.1 10*3/uL (ref 0.0–0.5)
Eosinophils Relative: 2 %
HCT: 35.2 % — ABNORMAL LOW (ref 36.0–46.0)
Hemoglobin: 11.9 g/dL — ABNORMAL LOW (ref 12.0–15.0)
Immature Granulocytes: 0 %
Lymphocytes Relative: 13 %
Lymphs Abs: 0.8 10*3/uL (ref 0.7–4.0)
MCH: 31 pg (ref 26.0–34.0)
MCHC: 33.8 g/dL (ref 30.0–36.0)
MCV: 91.7 fL (ref 80.0–100.0)
Monocytes Absolute: 0.6 10*3/uL (ref 0.1–1.0)
Monocytes Relative: 9 %
Neutro Abs: 4.6 10*3/uL (ref 1.7–7.7)
Neutrophils Relative %: 75 %
Platelet Count: 274 10*3/uL (ref 150–400)
RBC: 3.84 MIL/uL — ABNORMAL LOW (ref 3.87–5.11)
RDW: 13.9 % (ref 11.5–15.5)
WBC Count: 6.1 10*3/uL (ref 4.0–10.5)
nRBC: 0 % (ref 0.0–0.2)

## 2021-01-22 MED ORDER — IOHEXOL 350 MG/ML SOLN
60.0000 mL | Freq: Once | INTRAVENOUS | Status: AC | PRN
  Administered 2021-01-22: 60 mL via INTRAVENOUS

## 2021-01-24 ENCOUNTER — Inpatient Hospital Stay (HOSPITAL_BASED_OUTPATIENT_CLINIC_OR_DEPARTMENT_OTHER): Payer: Medicare Other | Admitting: Internal Medicine

## 2021-01-24 ENCOUNTER — Other Ambulatory Visit: Payer: Self-pay

## 2021-01-24 VITALS — BP 101/72 | HR 91 | Temp 97.7°F | Resp 19 | Ht 63.0 in | Wt 167.1 lb

## 2021-01-24 DIAGNOSIS — C3411 Malignant neoplasm of upper lobe, right bronchus or lung: Secondary | ICD-10-CM | POA: Diagnosis not present

## 2021-01-24 DIAGNOSIS — C3490 Malignant neoplasm of unspecified part of unspecified bronchus or lung: Secondary | ICD-10-CM | POA: Diagnosis not present

## 2021-01-24 NOTE — Progress Notes (Signed)
[facility] Telephone:([phone]   Fax:(336) Gordon, MD [place] [address]  DIAGNOSIS: Stage IIIA (T1b, N2, M0) non-small cell lung cancer presented with right upper lobe lung nodule in addition to mediastinal lymphadenopathy diagnosed in January 2021.  PRIOR THERAPY:   1) Concurrent chemoradiation with weekly carboplatin for AUC of 2 and paclitaxel 45 NG/M2.  Status post 5 cycles. Last dose received on 05/30/19. 2) Consolidation immunotherapy with Imfinzi 1500 mg/kg IV every 4 weeks.  First dose expected on 07/21/2019. Status post 13 cycles. 3) SBRT to the large right upper lobe pulmonary nodules under the care of Dr. Lisbeth Renshaw.   CURRENT THERAPY: Observation.  INTERVAL HISTORY: Betty Anderson 65 y.o. female returns to the clinic today for follow-up visit accompanied by her husband.  The patient is feeling fine today with no concerning complaints except for the baseline shortness of breath and cough.  She denied having any chest pain or hemoptysis.  She denied having any nausea, vomiting, diarrhea or constipation.  She has no weight loss or night sweats.  She has no headache or visual changes.  She continues to smoke at regular basis and not willing to quit.  She had repeat CT scan of the chest performed 2 days ago and she is here for evaluation and discussion of her scan results.  MEDICAL HISTORY: Past Medical History:  Diagnosis Date   COPD (chronic obstructive pulmonary disease) ([facility])    emphysema   Hyperlipidemia    Hypertension    lung ca dx'd 03/2019   Lung nodule    Paroxysmal atrial flutter ([facility])    per cardiology note   Peripheral artery disease ([facility])    Tobacco abuse    Vitamin D deficiency    Wears glasses     ALLERGIES:  has No Known Allergies.  MEDICATIONS:  Current Outpatient Medications  Medication Sig Dispense Refill   aspirin EC 81 MG tablet Take 1 tablet (81 mg total) by mouth  daily. 90 tablet 3   Cholecalciferol (DIALYVITE VITAMIN D 5000) 125 MCG (5000 UT) capsule Take 5,000 Units by mouth daily.     escitalopram (LEXAPRO) 10 MG tablet Take 10 mg by mouth daily.     guaiFENesin (MUCINEX) 600 MG 12 hr tablet Take by mouth 2 (two) times daily.     pantoprazole (PROTONIX) 40 MG tablet Take 1 tablet (40 mg total) by mouth 2 (two) times daily. 180 tablet 3   rosuvastatin (CRESTOR) 20 MG tablet Take 20 mg by mouth every evening.      Tiotropium Bromide-Olodaterol (STIOLTO RESPIMAT) 2.5-2.5 MCG/ACT AERS Inhale 2 puffs into the lungs daily. 4 g 0   vitamin B-12 (CYANOCOBALAMIN) 1000 MCG tablet Take 1,000 mcg by mouth daily.     No current facility-administered medications for this visit.    SURGICAL HISTORY:  Past Surgical History:  Procedure Laterality Date   BRONCHIAL NEEDLE ASPIRATION BIOPSY  04/12/2019   Procedure: BRONCHIAL NEEDLE ASPIRATION BIOPSIES;  Surgeon: Garner Nash, DO;  Location: [facility];  Service: Cardiopulmonary;;   ENDOBRONCHIAL ULTRASOUND N/A 04/12/2019   Procedure: ENDOBRONCHIAL ULTRASOUND;  Surgeon: Garner Nash, DO;  Location: [facility];  Service: Cardiopulmonary;  Laterality: N/A;   OVARIAN CYST REMOVAL     TONSILLECTOMY     TUBAL LIGATION     VIDEO BRONCHOSCOPY N/A 04/12/2019   Procedure: VIDEO BRONCHOSCOPY WITHOUT FLUORO;  Surgeon: Garner Nash, DO;  Location: [facility];  Service: Cardiopulmonary;  Laterality: N/A;    REVIEW OF SYSTEMS:  A comprehensive review of systems was negative except for: Respiratory: positive for cough and dyspnea on exertion   PHYSICAL EXAMINATION: General appearance: alert, cooperative, and no distress Head: Normocephalic, without obvious abnormality, atraumatic Neck: no adenopathy, no JVD, supple, symmetrical, trachea midline, and thyroid not enlarged, symmetric, no tenderness/mass/nodules Lymph nodes: Cervical, supraclavicular, and axillary nodes normal. Resp: clear to auscultation  bilaterally Back: symmetric, no curvature. ROM normal. No CVA tenderness. Cardio: regular rate and rhythm, S1, S2 normal, no murmur, click, rub or gallop GI: soft, non-tender; bowel sounds normal; no masses,  no organomegaly Extremities: extremities normal, atraumatic, no cyanosis or edema  ECOG PERFORMANCE STATUS: 1 - Symptomatic but completely ambulatory  Blood pressure 101/72, pulse 91, temperature 97.7 F (36.5 C), temperature source Tympanic, resp. rate 19, height 5\' 3"  (1.6 m), weight 167 lb 1.6 oz (75.8 kg), SpO2 99 %.  LABORATORY DATA: Lab Results  Component Value Date   WBC 6.1 01/22/2021   HGB 11.9 (L) 01/22/2021   HCT 35.2 (L) 01/22/2021   MCV 91.7 01/22/2021   PLT 274 01/22/2021      Chemistry      Component Value Date/Time   NA 138 01/22/2021 1516   K 3.7 01/22/2021 1516   CL 108 01/22/2021 1516   CO2 21 (L) 01/22/2021 1516   BUN 17 01/22/2021 1516   CREATININE 1.42 (H) 01/22/2021 1516      Component Value Date/Time   CALCIUM 9.0 01/22/2021 1516   ALKPHOS 97 01/22/2021 1516   AST 11 (L) 01/22/2021 1516   ALT 7 01/22/2021 1516   BILITOT 0.3 01/22/2021 1516       RADIOGRAPHIC STUDIES: No results found.   ASSESSMENT AND PLAN: This is a very pleasant 65 years old white female with stage IIIA non-small cell lung cancer, presented with right upper lobe lung nodule and mediastinal lymphadenopathy diagnosed in January 2021.   She completed a course of concurrent chemoradiation with weekly carboplatin and paclitaxel for 5 cycles and tolerated her treatment well.  She has partial response. The patient underwent consolidation treatment with Imfinzi 1500 mg IV every 4 weeks status post 13 cycles.  She tolerated this treatment well with no concerning adverse effects. She also underwent SBRT to enlarging right upper lobe pulmonary nodules under the care of Dr. Lisbeth Renshaw. The patient is currently on observation and she is feeling fine today with no concerning complaints  except for the baseline shortness of breath and cough secondary to persistent smoking. She had repeat CT scan of the chest performed 2 days ago.  Unfortunately the scan report still pending but I personally and independently reviewed the scan images in comparison to the previous scan 3 months ago and I did not notice any significant progression of the pulmonary nodules except for maybe slight increase in the size. I would wait for the final report for confirmation. I recommended for the patient to continue on observation with repeat CT scan of the chest in 3 months unless the pending scan showed any abnormality, I will call her with further recommendation. She was advised to call immediately if she has any other concerning symptoms in the interval. The patient voices understanding of current disease status and treatment options and is in agreement with the current care plan.  All questions were answered. The patient knows to call the clinic with any problems, questions or concerns. We can certainly see the patient much sooner if necessary.  Disclaimer: This note was dictated with voice recognition software. Similar sounding words can inadvertently be transcribed and may not be corrected upon review.

## 2021-01-28 ENCOUNTER — Telehealth: Payer: Self-pay | Admitting: Internal Medicine

## 2021-01-28 NOTE — Telephone Encounter (Signed)
Left message with follow-up appointments per 10/27 los.

## 2021-04-26 ENCOUNTER — Other Ambulatory Visit: Payer: Self-pay

## 2021-04-26 ENCOUNTER — Inpatient Hospital Stay: Payer: Medicare Other | Attending: Internal Medicine

## 2021-04-26 ENCOUNTER — Ambulatory Visit (HOSPITAL_COMMUNITY)
Admission: RE | Admit: 2021-04-26 | Discharge: 2021-04-26 | Disposition: A | Discharge status: Home / Self Care | Payer: Medicare Other | Source: Ambulatory Visit | Attending: Internal Medicine | Admitting: Internal Medicine

## 2021-04-26 DIAGNOSIS — C3490 Malignant neoplasm of unspecified part of unspecified bronchus or lung: Secondary | ICD-10-CM | POA: Insufficient documentation

## 2021-04-26 DIAGNOSIS — I2510 Atherosclerotic heart disease of native coronary artery without angina pectoris: Secondary | ICD-10-CM | POA: Insufficient documentation

## 2021-04-26 DIAGNOSIS — I700 Atherosclerosis of aorta: Secondary | ICD-10-CM | POA: Insufficient documentation

## 2021-04-26 DIAGNOSIS — Z9221 Personal history of antineoplastic chemotherapy: Secondary | ICD-10-CM | POA: Insufficient documentation

## 2021-04-26 DIAGNOSIS — Z923 Personal history of irradiation: Secondary | ICD-10-CM | POA: Insufficient documentation

## 2021-04-26 DIAGNOSIS — Z79899 Other long term (current) drug therapy: Secondary | ICD-10-CM | POA: Insufficient documentation

## 2021-04-26 DIAGNOSIS — F17210 Nicotine dependence, cigarettes, uncomplicated: Secondary | ICD-10-CM | POA: Insufficient documentation

## 2021-04-26 DIAGNOSIS — J432 Centrilobular emphysema: Secondary | ICD-10-CM | POA: Insufficient documentation

## 2021-04-26 DIAGNOSIS — R0602 Shortness of breath: Secondary | ICD-10-CM | POA: Insufficient documentation

## 2021-04-26 DIAGNOSIS — C3411 Malignant neoplasm of upper lobe, right bronchus or lung: Secondary | ICD-10-CM | POA: Insufficient documentation

## 2021-04-26 LAB — CBC WITH DIFFERENTIAL (CANCER CENTER ONLY)
Abs Immature Granulocytes: 0.01 10*3/uL (ref 0.00–0.07)
Basophils Absolute: 0.1 10*3/uL (ref 0.0–0.1)
Basophils Relative: 1 %
Eosinophils Absolute: 0.2 10*3/uL (ref 0.0–0.5)
Eosinophils Relative: 3 %
HCT: 34.2 % — ABNORMAL LOW (ref 36.0–46.0)
Hemoglobin: 11.2 g/dL — ABNORMAL LOW (ref 12.0–15.0)
Immature Granulocytes: 0 %
Lymphocytes Relative: 15 %
Lymphs Abs: 0.9 10*3/uL (ref 0.7–4.0)
MCH: 30.5 pg (ref 26.0–34.0)
MCHC: 32.7 g/dL (ref 30.0–36.0)
MCV: 93.2 fL (ref 80.0–100.0)
Monocytes Absolute: 0.5 10*3/uL (ref 0.1–1.0)
Monocytes Relative: 8 %
Neutro Abs: 4.3 10*3/uL (ref 1.7–7.7)
Neutrophils Relative %: 73 %
Platelet Count: 273 10*3/uL (ref 150–400)
RBC: 3.67 MIL/uL — ABNORMAL LOW (ref 3.87–5.11)
RDW: 14.5 % (ref 11.5–15.5)
WBC Count: 5.9 10*3/uL (ref 4.0–10.5)
nRBC: 0 % (ref 0.0–0.2)

## 2021-04-26 LAB — CMP (CANCER CENTER ONLY)
ALT: 7 U/L (ref 0–44)
AST: 11 U/L — ABNORMAL LOW (ref 15–41)
Albumin: 3.7 g/dL (ref 3.5–5.0)
Alkaline Phosphatase: 80 U/L (ref 38–126)
Anion gap: 5 (ref 5–15)
BUN: 15 mg/dL (ref 8–23)
CO2: 27 mmol/L (ref 22–32)
Calcium: 9.2 mg/dL (ref 8.9–10.3)
Chloride: 105 mmol/L (ref 98–111)
Creatinine: 1.44 mg/dL — ABNORMAL HIGH (ref 0.44–1.00)
GFR, Estimated: 40 mL/min — ABNORMAL LOW (ref >60)
Glucose, Bld: 88 mg/dL (ref 70–99)
Potassium: 3.5 mmol/L (ref 3.5–5.1)
Sodium: 137 mmol/L (ref 135–145)
Total Bilirubin: 0.4 mg/dL (ref 0.3–1.2)
Total Protein: 7.2 g/dL (ref 6.5–8.1)

## 2021-04-26 MED ORDER — IOHEXOL 300 MG/ML  SOLN
60.0000 mL | Freq: Once | INTRAMUSCULAR | Status: AC | PRN
  Administered 2021-04-26: 60 mL via INTRAVENOUS

## 2021-04-29 ENCOUNTER — Other Ambulatory Visit: Payer: Self-pay

## 2021-04-29 ENCOUNTER — Inpatient Hospital Stay (HOSPITAL_BASED_OUTPATIENT_CLINIC_OR_DEPARTMENT_OTHER): Payer: Medicare Other | Admitting: Internal Medicine

## 2021-04-29 VITALS — BP 91/64 | HR 95 | Temp 97.5°F | Resp 19 | Ht 63.0 in | Wt 168.4 lb

## 2021-04-29 DIAGNOSIS — Z923 Personal history of irradiation: Secondary | ICD-10-CM | POA: Diagnosis not present

## 2021-04-29 DIAGNOSIS — C3411 Malignant neoplasm of upper lobe, right bronchus or lung: Secondary | ICD-10-CM | POA: Diagnosis present

## 2021-04-29 DIAGNOSIS — C3490 Malignant neoplasm of unspecified part of unspecified bronchus or lung: Secondary | ICD-10-CM

## 2021-04-29 DIAGNOSIS — Z79899 Other long term (current) drug therapy: Secondary | ICD-10-CM | POA: Diagnosis not present

## 2021-04-29 DIAGNOSIS — I700 Atherosclerosis of aorta: Secondary | ICD-10-CM | POA: Diagnosis not present

## 2021-04-29 DIAGNOSIS — Z9221 Personal history of antineoplastic chemotherapy: Secondary | ICD-10-CM | POA: Diagnosis not present

## 2021-04-29 DIAGNOSIS — R0602 Shortness of breath: Secondary | ICD-10-CM | POA: Diagnosis not present

## 2021-04-29 DIAGNOSIS — F17210 Nicotine dependence, cigarettes, uncomplicated: Secondary | ICD-10-CM | POA: Diagnosis not present

## 2021-04-29 DIAGNOSIS — J432 Centrilobular emphysema: Secondary | ICD-10-CM | POA: Diagnosis not present

## 2021-04-29 DIAGNOSIS — I2510 Atherosclerotic heart disease of native coronary artery without angina pectoris: Secondary | ICD-10-CM | POA: Diagnosis not present

## 2021-04-29 NOTE — Progress Notes (Signed)
[facility] Telephone:(336) [phone]   Fax:(336) [place], MD [place] [address]  DIAGNOSIS: Stage IIIA (T1b, N2, M0) non-small cell lung cancer presented with right upper lobe lung nodule in addition to mediastinal lymphadenopathy diagnosed in January 2021.  PRIOR THERAPY:   1) Concurrent chemoradiation with weekly carboplatin for AUC of 2 and paclitaxel 45 NG/M2.  Status post 5 cycles. Last dose received on 05/30/19. 2) Consolidation immunotherapy with Imfinzi 1500 mg/kg IV every 4 weeks.  First dose expected on 07/21/2019. Status post 13 cycles. 3) SBRT to the large right upper lobe pulmonary nodules under the care of Dr. Lisbeth Renshaw.   CURRENT THERAPY: Observation.  INTERVAL HISTORY: Betty Anderson 66 y.o. female returns to the clinic today for follow-up visit accompanied by her husband.  The patient is feeling fine today with no concerning complaints except for fatigue and cough.  She continues to smoke cigarettes at regular basis.  She does not buy them anymore but she is rolling it on her own.  She denied having any chest pain, or hemoptysis but she continues to have shortness of breath with exertion.  She has no nausea, vomiting, diarrhea or constipation.  She has no headache or visual changes.  She is here today for evaluation with repeat CT scan of the chest for restaging of her disease.  MEDICAL HISTORY: Past Medical History:  Diagnosis Date   COPD (chronic obstructive pulmonary disease) ([facility])    emphysema   Hyperlipidemia    Hypertension    lung ca dx'd 03/2019   Lung nodule    Paroxysmal atrial flutter ([facility])    per cardiology note   Peripheral artery disease ([facility])    Tobacco abuse    Vitamin D deficiency    Wears glasses     ALLERGIES:  has No Known Allergies.  MEDICATIONS:  Current Outpatient Medications  Medication Sig Dispense Refill   aspirin EC 81 MG tablet Take 1 tablet (81 mg total) by  mouth daily. 90 tablet 3   Cholecalciferol (DIALYVITE VITAMIN D 5000) 125 MCG (5000 UT) capsule Take 5,000 Units by mouth daily.     escitalopram (LEXAPRO) 10 MG tablet Take 10 mg by mouth daily.     guaiFENesin (MUCINEX) 600 MG 12 hr tablet Take by mouth 2 (two) times daily.     pantoprazole (PROTONIX) 40 MG tablet Take 1 tablet (40 mg total) by mouth 2 (two) times daily. 180 tablet 3   rosuvastatin (CRESTOR) 20 MG tablet Take 20 mg by mouth every evening.      Tiotropium Bromide-Olodaterol (STIOLTO RESPIMAT) 2.5-2.5 MCG/ACT AERS Inhale 2 puffs into the lungs daily. 4 g 0   vitamin B-12 (CYANOCOBALAMIN) 1000 MCG tablet Take 1,000 mcg by mouth daily.     No current facility-administered medications for this visit.    SURGICAL HISTORY:  Past Surgical History:  Procedure Laterality Date   BRONCHIAL NEEDLE ASPIRATION BIOPSY  04/12/2019   Procedure: BRONCHIAL NEEDLE ASPIRATION BIOPSIES;  Surgeon: Garner Nash, DO;  Location: [facility];  Service: Cardiopulmonary;;   ENDOBRONCHIAL ULTRASOUND N/A 04/12/2019   Procedure: ENDOBRONCHIAL ULTRASOUND;  Surgeon: Garner Nash, DO;  Location: [facility];  Service: Cardiopulmonary;  Laterality: N/A;   OVARIAN CYST REMOVAL     TONSILLECTOMY     TUBAL LIGATION     VIDEO BRONCHOSCOPY N/A 04/12/2019   Procedure: VIDEO BRONCHOSCOPY WITHOUT FLUORO;  Surgeon: Garner Nash, DO;  Location: [facility]  ENDOSCOPY;  Service: Cardiopulmonary;  Laterality: N/A;    REVIEW OF SYSTEMS:  A comprehensive review of systems was negative except for: Respiratory: positive for cough and dyspnea on exertion   PHYSICAL EXAMINATION: General appearance: alert, cooperative, and no distress Head: Normocephalic, without obvious abnormality, atraumatic Neck: no adenopathy, no JVD, supple, symmetrical, trachea midline, and thyroid not enlarged, symmetric, no tenderness/mass/nodules Lymph nodes: Cervical, supraclavicular, and axillary nodes normal. Resp: clear to auscultation  bilaterally Back: symmetric, no curvature. ROM normal. No CVA tenderness. Cardio: regular rate and rhythm, S1, S2 normal, no murmur, click, rub or gallop GI: soft, non-tender; bowel sounds normal; no masses,  no organomegaly Extremities: extremities normal, atraumatic, no cyanosis or edema  ECOG PERFORMANCE STATUS: 1 - Symptomatic but completely ambulatory  Blood pressure 91/64, pulse 95, temperature (!) 97.5 F (36.4 C), temperature source Tympanic, resp. rate 19, height 5\' 3"  (1.6 m), weight 168 lb 6.4 oz (76.4 kg), SpO2 100 %.  LABORATORY DATA: Lab Results  Component Value Date   WBC 5.9 04/26/2021   HGB 11.2 (L) 04/26/2021   HCT 34.2 (L) 04/26/2021   MCV 93.2 04/26/2021   PLT 273 04/26/2021      Chemistry      Component Value Date/Time   NA 137 04/26/2021 1501   K 3.5 04/26/2021 1501   CL 105 04/26/2021 1501   CO2 27 04/26/2021 1501   BUN 15 04/26/2021 1501   CREATININE 1.44 (H) 04/26/2021 1501      Component Value Date/Time   CALCIUM 9.2 04/26/2021 1501   ALKPHOS 80 04/26/2021 1501   AST 11 (L) 04/26/2021 1501   ALT 7 04/26/2021 1501   BILITOT 0.4 04/26/2021 1501       RADIOGRAPHIC STUDIES: CT Chest W Contrast  Result Date: 04/28/2021 CLINICAL DATA:  66 year old female with history of non-small cell lung cancer. EXAM: CT CHEST WITH CONTRAST TECHNIQUE: Multidetector CT imaging of the chest was performed during intravenous contrast administration. RADIATION DOSE REDUCTION: This exam was performed according to the departmental dose-optimization program which includes automated exposure control, adjustment of the mA and/or kV according to patient size and/or use of iterative reconstruction technique. CONTRAST:  61mL OMNIPAQUE IOHEXOL 300 MG/ML  SOLN COMPARISON:  Chest CT 01/22/2021. FINDINGS: Cardiovascular: Heart size is normal. There is no significant pericardial fluid, thickening or pericardial calcification. There is aortic atherosclerosis, as well as atherosclerosis  of the great vessels of the mediastinum and the coronary arteries, including calcified atherosclerotic plaque in the left main, left anterior descending and left circumflex coronary arteries. Thickening and calcification of the aortic valve. Mediastinum/Nodes: No pathologically enlarged mediastinal or hilar lymph nodes. Esophagus is unremarkable in appearance. No axillary lymphadenopathy. Lungs/Pleura: Again noted is extensive thickening of the peribronchovascular interstitium with surrounding septal thickening and regional architectural distortion in the medial aspect of the right mid to upper lung, compatible with mild postradiation fibrosis. In the posterior aspect of this region (axial image 39 of series 7) there is an 8 mm pulmonary nodule which is slightly decreased in size compared to the prior examination. 7 mm right upper lobe nodule (axial image 37 of series 7), stable. Posterior right upper lobe pulmonary nodule measuring 6 x 3 mm (axial image 33 of series 7), stable. No other new suspicious appearing pulmonary nodules or masses are noted. Previously noted left upper lobe pulmonary nodule has regressed, indicative of a benign infectious or inflammatory etiology. Diffuse bronchial wall thickening with mild centrilobular and paraseptal emphysema. There is also widespread ground-glass attenuation and  predominantly interlobular septal thickening scattered throughout the mid to lower lungs bilaterally. No confluent consolidative airspace disease. No pleural effusions. Upper Abdomen: Aortic atherosclerosis. Musculoskeletal: There are no aggressive appearing lytic or blastic lesions noted in the visualized portions of the skeleton. IMPRESSION: 1. Stable examination with postradiation changes in the right upper lung and multiple small pulmonary nodules which are generally stable to decreased in size compared to the prior examination. No new pulmonary nodules or progressive lymphadenopathy identified. 2. Mild  diffuse bronchial wall thickening with mild centrilobular and paraseptal emphysema; imaging findings suggestive of underlying COPD. 3. Unusual appearance of the lungs which could be seen in the setting of interstitial pulmonary edema, however, heart size is normal. Clinical correlation for signs and symptoms of diastolic heart failure is suggested. In the absence of such symptoms, the possibility of interstitial lung disease should be considered, and close attention on follow-up studies is recommended. 4. Aortic atherosclerosis, in addition to left main and 2 vessel coronary artery disease. Please note that although the presence of coronary artery calcium documents the presence of coronary artery disease, the severity of this disease and any potential stenosis cannot be assessed on this non-gated CT examination. Assessment for potential risk factor modification, dietary therapy or pharmacologic therapy may be warranted, if clinically indicated. 5. There are calcifications of the aortic valve. Echocardiographic correlation for evaluation of potential valvular dysfunction may be warranted if clinically indicated. Aortic Atherosclerosis (ICD10-I70.0) and Emphysema (ICD10-J43.9). Electronically Signed   By: Vinnie Langton M.D.   On: 04/28/2021 09:52     ASSESSMENT AND PLAN: This is a very pleasant 66 years old white female with stage IIIA non-small cell lung cancer, presented with right upper lobe lung nodule and mediastinal lymphadenopathy diagnosed in January 2021.   She completed a course of concurrent chemoradiation with weekly carboplatin and paclitaxel for 5 cycles and tolerated her treatment well.  She has partial response. The patient underwent consolidation treatment with Imfinzi 1500 mg IV every 4 weeks status post 13 cycles.  She tolerated this treatment well with no concerning adverse effects. She also underwent SBRT to enlarging right upper lobe pulmonary nodules under the care of Dr. Lisbeth Renshaw. The  patient is currently on observation and she is feeling fine. She had repeat CT scan of the chest performed recently.  I personally and independently reviewed the scans and discussed the results with the patient and her husband. Her scan showed no concerning findings for disease progression. I recommended for her to continue on observation with repeat CT scan of the chest in 4 months. For the history of coronary artery disease she was advised to discuss with her primary care physician this condition for dietary or medication requirements.  She may also need referral to cardiology for evaluation. For the smoking cessation I strongly encouraged the patient to quit smoking. She was advised to call immediately if she has any other concerning symptoms in the interval. The patient voices understanding of current disease status and treatment options and is in agreement with the current care plan.  All questions were answered. The patient knows to call the clinic with any problems, questions or concerns. We can certainly see the patient much sooner if necessary.   Disclaimer: This note was dictated with voice recognition software. Similar sounding words can inadvertently be transcribed and may not be corrected upon review.

## 2021-04-30 ENCOUNTER — Other Ambulatory Visit (HOSPITAL_COMMUNITY): Payer: Self-pay | Admitting: Internal Medicine

## 2021-04-30 ENCOUNTER — Ambulatory Visit (HOSPITAL_COMMUNITY)
Admission: RE | Admit: 2021-04-30 | Discharge: 2021-04-30 | Disposition: A | Discharge status: Home / Self Care | Payer: Medicare Other | Source: Ambulatory Visit | Attending: Internal Medicine | Admitting: Internal Medicine

## 2021-04-30 DIAGNOSIS — I779 Disorder of arteries and arterioles, unspecified: Secondary | ICD-10-CM | POA: Insufficient documentation

## 2021-08-22 ENCOUNTER — Inpatient Hospital Stay: Payer: Medicare Other | Attending: Internal Medicine

## 2021-08-22 ENCOUNTER — Ambulatory Visit (HOSPITAL_COMMUNITY)
Admission: RE | Admit: 2021-08-22 | Discharge: 2021-08-22 | Disposition: A | Discharge status: Home / Self Care | Payer: Medicare Other | Source: Ambulatory Visit | Attending: Internal Medicine | Admitting: Internal Medicine

## 2021-08-22 ENCOUNTER — Other Ambulatory Visit: Payer: Self-pay

## 2021-08-22 DIAGNOSIS — R079 Chest pain, unspecified: Secondary | ICD-10-CM | POA: Insufficient documentation

## 2021-08-22 DIAGNOSIS — J984 Other disorders of lung: Secondary | ICD-10-CM | POA: Insufficient documentation

## 2021-08-22 DIAGNOSIS — I2510 Atherosclerotic heart disease of native coronary artery without angina pectoris: Secondary | ICD-10-CM | POA: Insufficient documentation

## 2021-08-22 DIAGNOSIS — C3490 Malignant neoplasm of unspecified part of unspecified bronchus or lung: Secondary | ICD-10-CM

## 2021-08-22 DIAGNOSIS — Z888 Allergy status to other drugs, medicaments and biological substances status: Secondary | ICD-10-CM | POA: Insufficient documentation

## 2021-08-22 DIAGNOSIS — Z923 Personal history of irradiation: Secondary | ICD-10-CM | POA: Diagnosis not present

## 2021-08-22 DIAGNOSIS — Z9221 Personal history of antineoplastic chemotherapy: Secondary | ICD-10-CM | POA: Insufficient documentation

## 2021-08-22 DIAGNOSIS — M549 Dorsalgia, unspecified: Secondary | ICD-10-CM | POA: Insufficient documentation

## 2021-08-22 DIAGNOSIS — J439 Emphysema, unspecified: Secondary | ICD-10-CM | POA: Diagnosis not present

## 2021-08-22 DIAGNOSIS — Z79899 Other long term (current) drug therapy: Secondary | ICD-10-CM | POA: Diagnosis not present

## 2021-08-22 DIAGNOSIS — F17200 Nicotine dependence, unspecified, uncomplicated: Secondary | ICD-10-CM | POA: Insufficient documentation

## 2021-08-22 DIAGNOSIS — C3411 Malignant neoplasm of upper lobe, right bronchus or lung: Secondary | ICD-10-CM | POA: Insufficient documentation

## 2021-08-22 LAB — CBC WITH DIFFERENTIAL (CANCER CENTER ONLY)
Abs Immature Granulocytes: 0.01 10*3/uL (ref 0.00–0.07)
Basophils Absolute: 0.1 10*3/uL (ref 0.0–0.1)
Basophils Relative: 1 %
Eosinophils Absolute: 0.1 10*3/uL (ref 0.0–0.5)
Eosinophils Relative: 3 %
HCT: 36 % (ref 36.0–46.0)
Hemoglobin: 12.1 g/dL (ref 12.0–15.0)
Immature Granulocytes: 0 %
Lymphocytes Relative: 17 %
Lymphs Abs: 1 10*3/uL (ref 0.7–4.0)
MCH: 31.1 pg (ref 26.0–34.0)
MCHC: 33.6 g/dL (ref 30.0–36.0)
MCV: 92.5 fL (ref 80.0–100.0)
Monocytes Absolute: 0.5 10*3/uL (ref 0.1–1.0)
Monocytes Relative: 9 %
Neutro Abs: 3.9 10*3/uL (ref 1.7–7.7)
Neutrophils Relative %: 70 %
Platelet Count: 272 10*3/uL (ref 150–400)
RBC: 3.89 MIL/uL (ref 3.87–5.11)
RDW: 14 % (ref 11.5–15.5)
WBC Count: 5.6 10*3/uL (ref 4.0–10.5)
nRBC: 0 % (ref 0.0–0.2)

## 2021-08-22 LAB — CMP (CANCER CENTER ONLY)
ALT: 7 U/L (ref 0–44)
AST: 11 U/L — ABNORMAL LOW (ref 15–41)
Albumin: 3.7 g/dL (ref 3.5–5.0)
Alkaline Phosphatase: 73 U/L (ref 38–126)
Anion gap: 6 (ref 5–15)
BUN: 16 mg/dL (ref 8–23)
CO2: 24 mmol/L (ref 22–32)
Calcium: 9.3 mg/dL (ref 8.9–10.3)
Chloride: 106 mmol/L (ref 98–111)
Creatinine: 1.61 mg/dL — ABNORMAL HIGH (ref 0.44–1.00)
GFR, Estimated: 35 mL/min — ABNORMAL LOW (ref >60)
Glucose, Bld: 83 mg/dL (ref 70–99)
Potassium: 4.2 mmol/L (ref 3.5–5.1)
Sodium: 136 mmol/L (ref 135–145)
Total Bilirubin: 0.4 mg/dL (ref 0.3–1.2)
Total Protein: 7.4 g/dL (ref 6.5–8.1)

## 2021-08-22 MED ORDER — SODIUM CHLORIDE (PF) 0.9 % IJ SOLN
INTRAMUSCULAR | Status: AC
  Filled 2021-08-22: qty 50

## 2021-08-22 MED ORDER — IOHEXOL 300 MG/ML  SOLN
75.0000 mL | Freq: Once | INTRAMUSCULAR | Status: AC | PRN
  Administered 2021-08-22: 60 mL via INTRAVENOUS

## 2021-08-27 ENCOUNTER — Telehealth: Payer: Self-pay | Admitting: Medical Oncology

## 2021-08-27 ENCOUNTER — Other Ambulatory Visit: Payer: Self-pay

## 2021-08-27 ENCOUNTER — Encounter: Payer: Self-pay | Admitting: Internal Medicine

## 2021-08-27 ENCOUNTER — Inpatient Hospital Stay (HOSPITAL_BASED_OUTPATIENT_CLINIC_OR_DEPARTMENT_OTHER): Payer: Medicare Other | Admitting: Internal Medicine

## 2021-08-27 VITALS — BP 118/80 | HR 107 | Temp 97.0°F | Resp 16 | Wt 167.5 lb

## 2021-08-27 DIAGNOSIS — C3490 Malignant neoplasm of unspecified part of unspecified bronchus or lung: Secondary | ICD-10-CM | POA: Diagnosis not present

## 2021-08-27 DIAGNOSIS — C3411 Malignant neoplasm of upper lobe, right bronchus or lung: Secondary | ICD-10-CM | POA: Diagnosis not present

## 2021-08-27 NOTE — Telephone Encounter (Signed)
Chest pain - Per Dr. Julien Nordmann , I  instructed pt to go to ED for any sudden on set shortness of breath , chest pain  that may become worse with breathing, lightheaded, dizzy or passing our., SOB . Pt voiced understanding.

## 2021-08-27 NOTE — Progress Notes (Signed)
[facility]     [place] Telephone:(336) [phone]   Fax:(336) [place], [place] [address]  DIAGNOSIS: Stage IIIA (T1b, N2, M0) non-small cell lung cancer presented with right upper lobe lung nodule in addition to mediastinal lymphadenopathy diagnosed in January 2021.  PRIOR THERAPY:   1) Concurrent chemoradiation with weekly carboplatin for AUC of 2 and paclitaxel 45 NG/M2.  Status post 5 cycles. Last dose received on 05/30/19. 2) Consolidation immunotherapy with Imfinzi 1500 mg/kg IV every 4 weeks.  First dose expected on 07/21/2019. Status post 13 cycles. 3) SBRT to the large right upper lobe pulmonary nodules under the care of Dr. Lisbeth Renshaw.   CURRENT THERAPY: Observation.  INTERVAL HISTORY: Betty Anderson 66 y.o. female returns to the clinic today for follow-up visit accompanied by her husband.  The patient is feeling fine today with no concerning complaints except for substernal mainly on the right side and around the back.  She denied having any shortness of breath, but has cough with no hemoptysis.  She denied having any fever or chills.  She has no nausea, vomiting, diarrhea or constipation.  She has no headache or visual changes.  She has no recent weight loss or night sweats.  Unfortunately she continues to smoke 3 pack of cigarette every day.  She is here today for evaluation with repeat CT scan of the chest for restaging of her disease.  MEDICAL HISTORY: Past Medical History:  Diagnosis Date   COPD (chronic obstructive pulmonary disease) ([facility])    emphysema   Hyperlipidemia    Hypertension    lung ca dx'd 03/2019   Lung nodule    Paroxysmal atrial flutter ([facility])    per cardiology note   Peripheral artery disease ([facility])    Tobacco abuse    Vitamin D deficiency    Wears glasses     ALLERGIES:  is allergic to lisinopril.  MEDICATIONS:  Current Outpatient Medications  Medication Sig Dispense Refill    aspirin EC 81 MG tablet Take 1 tablet (81 mg total) by mouth daily. 90 tablet 3   Cholecalciferol (DIALYVITE VITAMIN D 5000) 125 MCG (5000 UT) capsule Take 5,000 Units by mouth daily.     escitalopram (LEXAPRO) 10 MG tablet Take 10 mg by mouth daily.     guaiFENesin (MUCINEX) 600 MG 12 hr tablet Take by mouth 2 (two) times daily.     pantoprazole (PROTONIX) 40 MG tablet Take 1 tablet (40 mg total) by mouth 2 (two) times daily. 180 tablet 3   rosuvastatin (CRESTOR) 20 MG tablet Take 20 mg by mouth every evening.      Tiotropium Bromide-Olodaterol (STIOLTO RESPIMAT) 2.5-2.5 MCG/ACT AERS Inhale 2 puffs into the lungs daily. 4 g 0   vitamin B-12 (CYANOCOBALAMIN) 1000 MCG tablet Take 1,000 mcg by mouth daily.     No current facility-administered medications for this visit.    SURGICAL HISTORY:  Past Surgical History:  Procedure Laterality Date   BRONCHIAL NEEDLE ASPIRATION BIOPSY  04/12/2019   Procedure: BRONCHIAL NEEDLE ASPIRATION BIOPSIES;  Surgeon: Garner Nash, DO;  Location: [facility];  Service: Cardiopulmonary;;   ENDOBRONCHIAL ULTRASOUND N/A 04/12/2019   Procedure: ENDOBRONCHIAL ULTRASOUND;  Surgeon: Garner Nash, DO;  Location: [facility];  Service: Cardiopulmonary;  Laterality: N/A;   OVARIAN CYST REMOVAL     TONSILLECTOMY     TUBAL LIGATION     VIDEO BRONCHOSCOPY N/A 04/12/2019   Procedure: VIDEO BRONCHOSCOPY WITHOUT FLUORO;  Surgeon: Garner Nash, DO;  Location: [facility];  Service: Cardiopulmonary;  Laterality: N/A;    REVIEW OF SYSTEMS:  Constitutional: negative Eyes: negative Ears, nose, mouth, throat, and face: negative Respiratory: positive for cough and pleurisy/chest pain Cardiovascular: negative Gastrointestinal: negative Genitourinary:negative Integument/breast: negative Hematologic/lymphatic: negative Musculoskeletal:positive for back pain Neurological: negative Behavioral/Psych: negative Endocrine: negative Allergic/Immunologic: negative    PHYSICAL EXAMINATION: General appearance: alert, cooperative, fatigued, and no distress Head: Normocephalic, without obvious abnormality, atraumatic Neck: no adenopathy, no JVD, supple, symmetrical, trachea midline, and thyroid not enlarged, symmetric, no tenderness/mass/nodules Lymph nodes: Cervical, supraclavicular, and axillary nodes normal. Resp: clear to auscultation bilaterally Back: symmetric, no curvature. ROM normal. No CVA tenderness. Cardio: regular rate and rhythm, S1, S2 normal, no murmur, click, rub or gallop GI: soft, non-tender; bowel sounds normal; no masses,  no organomegaly Extremities: extremities normal, atraumatic, no cyanosis or edema Neurologic: Alert and oriented X 3, normal strength and tone. Normal symmetric reflexes. Normal coordination and gait  ECOG PERFORMANCE STATUS: 1 - Symptomatic but completely ambulatory  Blood pressure 118/80, pulse (!) 107, temperature (!) 97 F (36.1 C), temperature source Tympanic, resp. rate 16, weight 167 lb 8 oz (76 kg), SpO2 94 %.  LABORATORY DATA: Lab Results  Component Value Date   WBC 5.6 08/22/2021   HGB 12.1 08/22/2021   HCT 36.0 08/22/2021   MCV 92.5 08/22/2021   PLT 272 08/22/2021      Chemistry      Component Value Date/Time   NA 136 08/22/2021 0902   K 4.2 08/22/2021 0902   CL 106 08/22/2021 0902   CO2 24 08/22/2021 0902   BUN 16 08/22/2021 0902   CREATININE 1.61 (H) 08/22/2021 0902      Component Value Date/Time   CALCIUM 9.3 08/22/2021 0902   ALKPHOS 73 08/22/2021 0902   AST 11 (L) 08/22/2021 0902   ALT 7 08/22/2021 0902   BILITOT 0.4 08/22/2021 0902       RADIOGRAPHIC STUDIES: CT Chest W Contrast  Result Date: 08/23/2021 CLINICAL DATA:  Primary Cancer Type: Lung Imaging Indication: Routine surveillance Interval therapy since last imaging? No Initial Cancer Diagnosis Date: 04/12/2019; Established by: Biopsy-proven Detailed Pathology: Stage IIIA non-small cell lung cancer. Primary Tumor  location:  Right upper lobe. Surgeries: No thoracic. Chemotherapy: Yes; Ongoing? No; Most recent administration: 05/30/2019 Immunotherapy?  Yes; Type: Imfinzi; Ongoing? No Radiation therapy? Yes Date Range: 08/29/2020 - 09/07/2020; Target: Right lung Date Range: 05/02/2019 - 06/16/2019; Target: Right lung * Tracking Code: BO * EXAM: CT CHEST WITH CONTRAST TECHNIQUE: Multidetector CT imaging of the chest was performed during intravenous contrast administration. RADIATION DOSE REDUCTION: This exam was performed according to the departmental dose-optimization program which includes automated exposure control, adjustment of the mA and/or kV according to patient size and/or use of iterative reconstruction technique. CONTRAST:  38mL OMNIPAQUE IOHEXOL 300 MG/ML  SOLN COMPARISON:  Most recent CT chest 04/26/2021. 04/14/2019 PET-CT. FINDINGS: Cardiovascular: Calcified and noncalcified aortic atherosclerosis. Evidence of coronary artery disease as before. No substantial pericardial effusion. No signs of nodular thickening of the pericardium. Central pulmonary vasculature is unremarkable on venous phase. Mediastinum/Nodes: No adenopathy as discrete nodal enlargement in the chest. Mild thickening of subcarinal soft tissue/lymph nodes with no change can be measured up to 14 mm short axis (image 58/2) Scattered small lymph nodes elsewhere in the chest. Lungs/Pleura: Signs of pulmonary emphysema. Areas of subpleural reticulation and scarring which are similar to prior imaging. Nodular area in the medial RIGHT upper lobe (image 33/5)  8 mm, stable when compared to previous imaging. Adjacent septal thickening and bandlike changes that extend towards the periphery from the RIGHT suprahilar region into the upper lobe are unchanged. Small nodule along the pleural surface in the RIGHT upper lobe (image 29/5) more confluent, denser on the current study 10 x 7 mm as compared to 6 x 3 mm, approximately 7 mm in October of 2022 No  consolidation or effusion.  Airways are patent. Upper Abdomen: Incidental imaging of upper abdominal contents without acute findings. Musculoskeletal: No chest wall abnormality. No acute or significant osseous findings. IMPRESSION: Largely stable appearance of the chest with scarring in the RIGHT upper lobe related to prior therapy. There is however a nodular area along the pleural surface in the RIGHT upper lobe that shows enlargement in increasing soft tissue density. New area of disease is considered. Short interval follow-up, biopsy or PET imaging could be utilized as warranted for further assessment. Mild fullness of subcarinal nodal tissue, attention on follow-up. Subpleural reticulation, subtle ground-glass and areas of septal thickening remain nonspecific but do raise the question of interstitial lung disease, attention on follow-up or assessment with high-resolution chest CT as warranted. Electronically Signed   By: Zetta Bills M.D.   On: 08/23/2021 11:49     ASSESSMENT AND PLAN: This is a very pleasant 66 years old white female with stage IIIA non-small cell lung cancer, presented with right upper lobe lung nodule and mediastinal lymphadenopathy diagnosed in January 2021.   She completed a course of concurrent chemoradiation with weekly carboplatin and paclitaxel for 5 cycles and tolerated her treatment well.  She has partial response. The patient underwent consolidation treatment with Imfinzi 1500 mg IV every 4 weeks status post 13 cycles.  She tolerated this treatment well with no concerning adverse effects. She also underwent SBRT to enlarging right upper lobe pulmonary nodules under the care of Dr. Lisbeth Renshaw. The patient has been on observation and feeling fine except for the recent substernal pain with radiation to the right and back. She had repeat CT scan of the chest performed recently.  I personally and independently reviewed the scan images and discussed the result with the patient and her  husband. Her scan showed no concerning findings for disease progression except for enlargement of the nodular area along the pleural surface in the right upper lobe concerning for disease recurrence and PET scan was recommended for further assessment. I recommended for the patient to have a PET scan performed for further evaluation of her condition. In the meantime I will send her to Dr. Lisbeth Renshaw for consideration of SBRT to this area. For the recurrent chest pain, the patient was strongly advised to quit smoking and also call her primary care physician or go to the emergency department if she has persistent pain and no explanation on the PET scan. I will see her back for follow-up visit in 4 months for evaluation and repeat CT scan of the chest for restaging of her disease. The patient voices understanding of current disease status and treatment options and is in agreement with the current care plan.  All questions were answered. The patient knows to call the clinic with any problems, questions or concerns. We can certainly see the patient much sooner if necessary.   Disclaimer: This note was dictated with voice recognition software. Similar sounding words can inadvertently be transcribed and may not be corrected upon review.

## 2021-09-05 ENCOUNTER — Ambulatory Visit (HOSPITAL_COMMUNITY)
Admission: RE | Admit: 2021-09-05 | Discharge: 2021-09-05 | Disposition: A | Discharge status: Home / Self Care | Payer: Medicare Other | Source: Ambulatory Visit | Attending: Internal Medicine | Admitting: Internal Medicine

## 2021-09-05 DIAGNOSIS — C3490 Malignant neoplasm of unspecified part of unspecified bronchus or lung: Secondary | ICD-10-CM | POA: Diagnosis present

## 2021-09-05 LAB — GLUCOSE, CAPILLARY: Glucose-Capillary: 91 mg/dL (ref 70–99)

## 2021-09-05 MED ORDER — FLUDEOXYGLUCOSE F - 18 (FDG) INJECTION
8.4000 | Freq: Once | INTRAVENOUS | Status: AC
  Administered 2021-09-05: 8.33 via INTRAVENOUS

## 2021-09-09 NOTE — Progress Notes (Signed)
Radiation Oncology         [phone] ________________________________  Name: Betty Anderson        MRN: [id number]  Date of Service: 09/11/2021 DOB: Oct 12, 1955  WP:[id number], Elta Guadeloupe, MD  Curt Bears, MD     REFERRING PHYSICIAN: Curt Bears, MD   DIAGNOSIS: The encounter diagnosis was Malignant neoplasm of upper lobe of right lung [facility]).   HISTORY OF PRESENT ILLNESS: Betty Anderson is a 66 y.o. female with a history of Progressive Stage IIIA, cT1bN2M0, NSCLC, cannot further classify of the RUL.  She was treated in 2021 with chemoradiation.  She completed consolidative immunotherapy with Imfinzi in March 2022.  Interval growth of 3 nodules in the right upper lobe were seen in April 2022 so she proceeded with stereotactic body radiotherapy to these lesions.  She has since been followed in surveillance with Dr. Julien Nordmann, a CT of the chest with contrast on 08/22/2021 showed stable appearance in the posttreatment changes of the right upper lobe however a nodular area in the pleural surface of the right upper lobe showed an increase in size and density.  Fullness in the subcarinal nodal station was also identified as well as groundglass changes and areas of septal thickening that were felt to be nonspecific.  A PET scan for restaging purposes on 09/05/2021 showed increased uptake with an SUV of 3.74 and the subpleural nodular density in the right upper lobe concerning for new site of active tumor.  No additional changes were seen elsewhere.  She continues to have stigmata of atherosclerotic and emphysematous disease.  She is seen today to discuss possibilities of additional radiotherapy.    PREVIOUS RADIATION THERAPY: Yes   08/29/2020 through 09/07/2020 SBRT Treatment Site Technique Total Dose (Gy) Dose per Fx (Gy) Completed Fx Beam Energies  Lung, Right: Lung_Rt IMRT 60/60 12 5/5 6XFFF  Lung, Right: Lung_Rt_PTV  2 separate lesions in this isocenter IMRT 60/60 12 5/5 6XFFF    05/02/19-06/16/19: The right lung target was treated to 60 Gy in 30 fraction followed by a 6 Gy boost in 3 fractions.  PAST MEDICAL HISTORY:  Past Medical History:  Diagnosis Date   COPD (chronic obstructive pulmonary disease) ([place])    emphysema   Hyperlipidemia    Hypertension    lung ca dx'd 03/2019   Lung nodule    Paroxysmal atrial flutter ([place])    per cardiology note   Peripheral artery disease ([place])    Tobacco abuse    Vitamin D deficiency    Wears glasses        PAST SURGICAL HISTORY: Past Surgical History:  Procedure Laterality Date   BRONCHIAL NEEDLE ASPIRATION BIOPSY  04/12/2019   Procedure: BRONCHIAL NEEDLE ASPIRATION BIOPSIES;  Surgeon: Garner Nash, DO;  Location: [place];  Service: Cardiopulmonary;;   ENDOBRONCHIAL ULTRASOUND N/A 04/12/2019   Procedure: ENDOBRONCHIAL ULTRASOUND;  Surgeon: Garner Nash, DO;  Location: [place];  Service: Cardiopulmonary;  Laterality: N/A;   OVARIAN CYST REMOVAL     TONSILLECTOMY     TUBAL LIGATION     VIDEO BRONCHOSCOPY N/A 04/12/2019   Procedure: VIDEO BRONCHOSCOPY WITHOUT FLUORO;  Surgeon: Garner Nash, DO;  Location: [place];  Service: Cardiopulmonary;  Laterality: N/A;     FAMILY HISTORY:  Family History  Problem Relation Age of Onset   Stroke Mother    Stroke Father    Lung cancer Father    Mesothelioma Brother    Lung cancer Sister    Colon cancer Sister  Anuerysm Brother      SOCIAL HISTORY:  reports that she has been smoking cigarettes. She started smoking about 59 years ago. She has a 175.00 pack-year smoking history. She has never used smokeless tobacco. She reports current alcohol use of about 8.0 standard drinks of alcohol per week. She reports that she does not use drugs. The patient is married and lives in [place].    ALLERGIES: Lisinopril   MEDICATIONS:  Current Outpatient Medications  Medication Sig Dispense Refill   aspirin EC 81 MG tablet Take 1 tablet (81 mg total)  by mouth daily. 90 tablet 3   Cholecalciferol (DIALYVITE VITAMIN D 5000) 125 MCG (5000 UT) capsule Take 5,000 Units by mouth daily.     escitalopram (LEXAPRO) 10 MG tablet Take 10 mg by mouth daily.     guaiFENesin (MUCINEX) 600 MG 12 hr tablet Take by mouth 2 (two) times daily.     pantoprazole (PROTONIX) 40 MG tablet Take 1 tablet (40 mg total) by mouth 2 (two) times daily. 180 tablet 3   rosuvastatin (CRESTOR) 20 MG tablet Take 20 mg by mouth every evening.      Tiotropium Bromide-Olodaterol (STIOLTO RESPIMAT) 2.5-2.5 MCG/ACT AERS Inhale 2 puffs into the lungs daily. 4 g 0   vitamin B-12 (CYANOCOBALAMIN) 1000 MCG tablet Take 1,000 mcg by mouth daily.     No current facility-administered medications for this encounter.     REVIEW OF SYSTEMS: On review of systems, the patient reports that she is doing pretty well overall. She continues to smoke and does not have a desire to quit at this time. She continues to have a dry to somewhat productive cough without reports of hemoptysis. She has shortness of breath at baseline exacerbated by exertional activities. No other complaints are verbalized.      PHYSICAL EXAM:  Wt Readings from Last 3 Encounters:  09/11/21 168 lb 6.4 oz (76.4 kg)  08/27/21 167 lb 8 oz (76 kg)  04/29/21 168 lb 6.4 oz (76.4 kg)   Temp Readings from Last 3 Encounters:  09/11/21 97.7 F (36.5 C) (Temporal)  08/27/21 (!) 97 F (36.1 C) (Tympanic)  04/29/21 (!) 97.5 F (36.4 C) (Tympanic)   BP Readings from Last 3 Encounters:  09/11/21 (!) 98/58  08/27/21 118/80  04/29/21 91/64   Pulse Readings from Last 3 Encounters:  09/11/21 92  08/27/21 (!) 107  04/29/21 95   Pain Assessment Pain Score: 0-No pain/10  In general this is a tired appearing caucasian female in no acute distress. She's alert and oriented x4 and appropriate throughout the examination. Cardiopulmonary assessment is negative for acute distress and she exhibits normal effort.     ECOG = 1  0 -  Asymptomatic (Fully active, able to carry on all predisease activities without restriction)  1 - Symptomatic but completely ambulatory (Restricted in physically strenuous activity but ambulatory and able to carry out work of a light or sedentary nature. For example, light housework, office work)  2 - Symptomatic, <50% in bed during the day (Ambulatory and capable of all self care but unable to carry out any work activities. Up and about more than 50% of waking hours)  3 - Symptomatic, >50% in bed, but not bedbound (Capable of only limited self-care, confined to bed or chair 50% or more of waking hours)  4 - Bedbound (Completely disabled. Cannot carry on any self-care. Totally confined to bed or chair)  5 - Death   Lolita Rieger, Creech RH, Tormey DC, et  al. [phone]). "Toxicity and response criteria of the [facility]". [place] Oncol. 5 (6): 649-55    LABORATORY DATA:  Lab Results  Component Value Date   WBC 5.6 08/22/2021   HGB 12.1 08/22/2021   HCT 36.0 08/22/2021   MCV 92.5 08/22/2021   PLT 272 08/22/2021   Lab Results  Component Value Date   NA 136 08/22/2021   K 4.2 08/22/2021   CL 106 08/22/2021   CO2 24 08/22/2021   Lab Results  Component Value Date   ALT 7 08/22/2021   AST 11 (L) 08/22/2021   ALKPHOS 73 08/22/2021   BILITOT 0.4 08/22/2021      RADIOGRAPHY: NM PET Image Restage (PS) Skull Base to Thigh (F-18 FDG)  Result Date: 09/07/2021 CLINICAL DATA:  Subsequent treatment strategy for non-small cell lung cancer. EXAM: NUCLEAR MEDICINE PET SKULL BASE TO THIGH TECHNIQUE: 8.3 mCi F-18 FDG was injected intravenously. Full-ring PET imaging was performed from the skull base to thigh after the radiotracer. CT data was obtained and used for attenuation correction and anatomic localization. Fasting blood glucose: 91 mg/dl COMPARISON:  CT chest 08/22/2021 remote PET-CT from 04/14/2019 FINDINGS: Mediastinal blood pool activity: SUV max 2.66 Liver activity:  SUV max NA NECK: No hypermetabolic lymph nodes in the neck. Incidental CT findings: none CHEST: No tracer avid axillary, supraclavicular, mediastinal or hilar lymph nodes. Within the posterolateral right upper lobe there is a progressive subpleural nodular density (as described on 08/22/2021) which measures 1.2 cm and has an SUV max of 3.74, image 59/601. No additional tracer avid pulmonary nodule or mass noted. Stable appearance of the radiation change within the right upper lobe with geographic area of fibrosis and architectural distortion. Incidental CT findings: Emphysema. Diffuse chronic postinflammatory changes with a lower lung zone predominance is again noted including peripheral predominant interstitial reticulation and ground-glass attenuation. Aortic atherosclerosis and coronary artery calcifications. ABDOMEN/PELVIS: No abnormal hypermetabolic activity within the liver, pancreas, adrenal glands, or spleen. No hypermetabolic lymph nodes in the abdomen or pelvis. Incidental CT findings: Nonobstructing bilateral renal calculi. Aortic atherosclerotic calcifications. Benign Bosniak class 1 cyst arises off the lateral cortex of the left kidney measuring 1.7 cm. No follow-up recommended. Enlarged fibroid uterus is again noted. SKELETON: No focal hypermetabolic activity to suggest skeletal metastasis. Incidental CT findings: none IMPRESSION: 1. There is increased radiotracer uptake (SUV max 3.74) corresponding to the progressive peripheral subpleural nodular density within the right upper lobe imaging findings are concerning for new site of metabolically active tumor. This may represent a new primary or focus of metastatic disease. 2. No additional abnormal areas of increased uptake identified within the neck, chest, abdomen or pelvis. 3. Stable post treatment changes within the right upper lobe secondary to external beam radiation. 4. Aortic Atherosclerosis (ICD10-I70.0) and Emphysema (ICD10-J43.9).  Electronically Signed   By: Kerby Moors M.D.   On: 09/07/2021 14:20   CT Chest W Contrast  Result Date: 08/23/2021 CLINICAL DATA:  Primary Cancer Type: Lung Imaging Indication: Routine surveillance Interval therapy since last imaging? No Initial Cancer Diagnosis Date: 04/12/2019; Established by: Biopsy-proven Detailed Pathology: Stage IIIA non-small cell lung cancer. Primary Tumor location:  Right upper lobe. Surgeries: No thoracic. Chemotherapy: Yes; Ongoing? No; Most recent administration: 05/30/2019 Immunotherapy?  Yes; Type: Imfinzi; Ongoing? No Radiation therapy? Yes Date Range: 08/29/2020 - 09/07/2020; Target: Right lung Date Range: 05/02/2019 - 06/16/2019; Target: Right lung * Tracking Code: BO * EXAM: CT CHEST WITH CONTRAST TECHNIQUE: Multidetector CT imaging of the chest  was performed during intravenous contrast administration. RADIATION DOSE REDUCTION: This exam was performed according to the departmental dose-optimization program which includes automated exposure control, adjustment of the mA and/or kV according to patient size and/or use of iterative reconstruction technique. CONTRAST:  61mL OMNIPAQUE IOHEXOL 300 MG/ML  SOLN COMPARISON:  Most recent CT chest 04/26/2021. 04/14/2019 PET-CT. FINDINGS: Cardiovascular: Calcified and noncalcified aortic atherosclerosis. Evidence of coronary artery disease as before. No substantial pericardial effusion. No signs of nodular thickening of the pericardium. Central pulmonary vasculature is unremarkable on venous phase. Mediastinum/Nodes: No adenopathy as discrete nodal enlargement in the chest. Mild thickening of subcarinal soft tissue/lymph nodes with no change can be measured up to 14 mm short axis (image 58/2) Scattered small lymph nodes elsewhere in the chest. Lungs/Pleura: Signs of pulmonary emphysema. Areas of subpleural reticulation and scarring which are similar to prior imaging. Nodular area in the medial RIGHT upper lobe (image 33/5) 8 mm, stable  when compared to previous imaging. Adjacent septal thickening and bandlike changes that extend towards the periphery from the RIGHT suprahilar region into the upper lobe are unchanged. Small nodule along the pleural surface in the RIGHT upper lobe (image 29/5) more confluent, denser on the current study 10 x 7 mm as compared to 6 x 3 mm, approximately 7 mm in October of 2022 No consolidation or effusion.  Airways are patent. Upper Abdomen: Incidental imaging of upper abdominal contents without acute findings. Musculoskeletal: No chest wall abnormality. No acute or significant osseous findings. IMPRESSION: Largely stable appearance of the chest with scarring in the RIGHT upper lobe related to prior therapy. There is however a nodular area along the pleural surface in the RIGHT upper lobe that shows enlargement in increasing soft tissue density. New area of disease is considered. Short interval follow-up, biopsy or PET imaging could be utilized as warranted for further assessment. Mild fullness of subcarinal nodal tissue, attention on follow-up. Subpleural reticulation, subtle ground-glass and areas of septal thickening remain nonspecific but do raise the question of interstitial lung disease, attention on follow-up or assessment with high-resolution chest CT as warranted. Electronically Signed   By: Zetta Bills M.D.   On: 08/23/2021 11:49       IMPRESSION/PLAN: 1. Progressive Stage IIIA, cT1bN2M0, NSCLC, cannot further classify of the RUL. Dr. Lisbeth Renshaw has reviewed her imaging and course. Today I reviewed this and shared Dr. Lisbeth Renshaw feels that she could benefit from additional radiotherapy to the RUL with SBRT style treatment. We discussed the risks, benefits, short, and long term effects of radiotherapy, as well as the definitive intent, and the patient is interested in proceeding. I reviewed the delivery and logistics of radiotherapy and anticipates a course of  5 fractions of SBRT to the RUL nodule. Written  consent is obtained and placed in the chart, a copy was provided to the patient. The patient will be contacted to coordinate treatment planning by our simulation department.    In a visit lasting 45 minutes, greater than 50% of the time was spent face to face discussing the patient's condition, in preparation for the discussion, and coordinating the patient's care.       Carola Rhine, [facility]   **Disclaimer: This note was dictated with voice recognition software. Similar sounding words can inadvertently be transcribed and this note may contain transcription errors which may not have been corrected upon publication of note.**

## 2021-09-11 ENCOUNTER — Other Ambulatory Visit: Payer: Self-pay

## 2021-09-11 ENCOUNTER — Encounter: Payer: Self-pay | Admitting: Radiation Oncology

## 2021-09-11 ENCOUNTER — Ambulatory Visit
Admission: RE | Admit: 2021-09-11 | Discharge: 2021-09-11 | Disposition: A | Discharge status: Home / Self Care | Payer: Medicare Other | Source: Ambulatory Visit | Attending: Radiation Oncology | Admitting: Radiation Oncology

## 2021-09-11 VITALS — BP 98/58 | HR 92 | Temp 97.7°F | Resp 18 | Ht 63.0 in | Wt 168.4 lb

## 2021-09-11 DIAGNOSIS — C3411 Malignant neoplasm of upper lobe, right bronchus or lung: Secondary | ICD-10-CM | POA: Diagnosis present

## 2021-09-11 DIAGNOSIS — E559 Vitamin D deficiency, unspecified: Secondary | ICD-10-CM | POA: Diagnosis not present

## 2021-09-11 DIAGNOSIS — Z79899 Other long term (current) drug therapy: Secondary | ICD-10-CM | POA: Diagnosis not present

## 2021-09-11 DIAGNOSIS — Z7982 Long term (current) use of aspirin: Secondary | ICD-10-CM | POA: Insufficient documentation

## 2021-09-11 DIAGNOSIS — J439 Emphysema, unspecified: Secondary | ICD-10-CM | POA: Diagnosis not present

## 2021-09-11 DIAGNOSIS — E785 Hyperlipidemia, unspecified: Secondary | ICD-10-CM | POA: Insufficient documentation

## 2021-09-11 DIAGNOSIS — Z800 Family history of malignant neoplasm of digestive organs: Secondary | ICD-10-CM | POA: Insufficient documentation

## 2021-09-11 DIAGNOSIS — I700 Atherosclerosis of aorta: Secondary | ICD-10-CM | POA: Diagnosis not present

## 2021-09-11 DIAGNOSIS — I480 Paroxysmal atrial fibrillation: Secondary | ICD-10-CM | POA: Insufficient documentation

## 2021-09-11 DIAGNOSIS — F17210 Nicotine dependence, cigarettes, uncomplicated: Secondary | ICD-10-CM | POA: Diagnosis not present

## 2021-09-11 DIAGNOSIS — I739 Peripheral vascular disease, unspecified: Secondary | ICD-10-CM | POA: Diagnosis not present

## 2021-09-11 DIAGNOSIS — Z801 Family history of malignant neoplasm of trachea, bronchus and lung: Secondary | ICD-10-CM | POA: Diagnosis not present

## 2021-09-11 DIAGNOSIS — I2510 Atherosclerotic heart disease of native coronary artery without angina pectoris: Secondary | ICD-10-CM | POA: Insufficient documentation

## 2021-09-11 DIAGNOSIS — Z923 Personal history of irradiation: Secondary | ICD-10-CM | POA: Diagnosis not present

## 2021-09-11 NOTE — Progress Notes (Signed)
Reconsult appointment. I verified patient's identity and began nursing interview w/ spouse Mr. Miasha Emmons in attendance. Patient reports SOB from COPD and a mild cough. No other issues reported at this time.  Meaningful use complete. Postmenopausal- NO chances of pregnancy.  BP (!) 98/58 (BP Location: Left Arm, Patient Position: Sitting, Cuff Size: Normal)   Pulse 92   Temp 97.7 F (36.5 C) (Temporal)   Resp 18   Ht 5\' 3"  (1.6 m)   Wt 168 lb 6.4 oz (76.4 kg)   SpO2 100%   BMI 29.83 kg/m

## 2021-09-20 ENCOUNTER — Ambulatory Visit
Admission: RE | Admit: 2021-09-20 | Discharge: 2021-09-20 | Disposition: A | Discharge status: Home / Self Care | Payer: Medicare Other | Source: Ambulatory Visit | Attending: Radiation Oncology | Admitting: Radiation Oncology

## 2021-09-20 ENCOUNTER — Other Ambulatory Visit: Payer: Self-pay | Admitting: Physician Assistant

## 2021-09-20 ENCOUNTER — Other Ambulatory Visit: Payer: Self-pay

## 2021-09-20 DIAGNOSIS — C3411 Malignant neoplasm of upper lobe, right bronchus or lung: Secondary | ICD-10-CM | POA: Diagnosis present

## 2021-10-07 DIAGNOSIS — C3411 Malignant neoplasm of upper lobe, right bronchus or lung: Secondary | ICD-10-CM | POA: Insufficient documentation

## 2021-10-08 ENCOUNTER — Ambulatory Visit
Admission: RE | Admit: 2021-10-08 | Discharge: 2021-10-08 | Disposition: A | Discharge status: Home / Self Care | Payer: Medicare Other | Source: Ambulatory Visit | Attending: Radiation Oncology | Admitting: Radiation Oncology

## 2021-10-08 ENCOUNTER — Other Ambulatory Visit: Payer: Self-pay

## 2021-10-08 DIAGNOSIS — C3411 Malignant neoplasm of upper lobe, right bronchus or lung: Secondary | ICD-10-CM | POA: Diagnosis not present

## 2021-10-08 LAB — RAD ONC ARIA SESSION SUMMARY
Course Elapsed Days: 0
Plan Fractions Treated to Date: 1
Plan Prescribed Dose Per Fraction: 12 Gy
Plan Total Fractions Prescribed: 5
Plan Total Prescribed Dose: 60 Gy
Reference Point Dosage Given to Date: 12 Gy
Reference Point Session Dosage Given: 12 Gy
Session Number: 1

## 2021-10-09 ENCOUNTER — Ambulatory Visit: Payer: Medicare Other | Admitting: Radiation Oncology

## 2021-10-10 ENCOUNTER — Ambulatory Visit
Admission: RE | Admit: 2021-10-10 | Discharge: 2021-10-10 | Disposition: A | Discharge status: Home / Self Care | Payer: Medicare Other | Source: Ambulatory Visit | Attending: Radiation Oncology | Admitting: Radiation Oncology

## 2021-10-10 ENCOUNTER — Other Ambulatory Visit: Payer: Self-pay

## 2021-10-10 DIAGNOSIS — C3411 Malignant neoplasm of upper lobe, right bronchus or lung: Secondary | ICD-10-CM | POA: Diagnosis not present

## 2021-10-10 LAB — RAD ONC ARIA SESSION SUMMARY
Course Elapsed Days: 2
Plan Fractions Treated to Date: 2
Plan Prescribed Dose Per Fraction: 12 Gy
Plan Total Fractions Prescribed: 5
Plan Total Prescribed Dose: 60 Gy
Reference Point Dosage Given to Date: 24 Gy
Reference Point Session Dosage Given: 12 Gy
Session Number: 2

## 2021-10-11 ENCOUNTER — Ambulatory Visit: Payer: Medicare Other | Admitting: Radiation Oncology

## 2021-10-14 ENCOUNTER — Ambulatory Visit
Admission: RE | Admit: 2021-10-14 | Discharge: 2021-10-14 | Disposition: A | Discharge status: Home / Self Care | Payer: Medicare Other | Source: Ambulatory Visit | Attending: Radiation Oncology | Admitting: Radiation Oncology

## 2021-10-14 ENCOUNTER — Other Ambulatory Visit: Payer: Self-pay

## 2021-10-14 DIAGNOSIS — C3411 Malignant neoplasm of upper lobe, right bronchus or lung: Secondary | ICD-10-CM | POA: Diagnosis not present

## 2021-10-14 LAB — RAD ONC ARIA SESSION SUMMARY
Course Elapsed Days: 6
Plan Fractions Treated to Date: 3
Plan Prescribed Dose Per Fraction: 12 Gy
Plan Total Fractions Prescribed: 5
Plan Total Prescribed Dose: 60 Gy
Reference Point Dosage Given to Date: 36 Gy
Reference Point Session Dosage Given: 12 Gy
Session Number: 3

## 2021-10-16 ENCOUNTER — Other Ambulatory Visit: Payer: Self-pay

## 2021-10-16 ENCOUNTER — Ambulatory Visit
Admission: RE | Admit: 2021-10-16 | Discharge: 2021-10-16 | Disposition: A | Discharge status: Home / Self Care | Payer: Medicare Other | Source: Ambulatory Visit | Attending: Radiation Oncology | Admitting: Radiation Oncology

## 2021-10-16 DIAGNOSIS — C3411 Malignant neoplasm of upper lobe, right bronchus or lung: Secondary | ICD-10-CM | POA: Diagnosis not present

## 2021-10-16 LAB — RAD ONC ARIA SESSION SUMMARY
Course Elapsed Days: 8
Plan Fractions Treated to Date: 4
Plan Prescribed Dose Per Fraction: 12 Gy
Plan Total Fractions Prescribed: 5
Plan Total Prescribed Dose: 60 Gy
Reference Point Dosage Given to Date: 48 Gy
Reference Point Session Dosage Given: 12 Gy
Session Number: 4

## 2021-10-17 ENCOUNTER — Telehealth: Payer: Self-pay | Admitting: Internal Medicine

## 2021-10-17 NOTE — Telephone Encounter (Signed)
Called patient regarding upcoming September appointments, voicemail was left.

## 2021-10-18 ENCOUNTER — Other Ambulatory Visit: Payer: Self-pay

## 2021-10-18 ENCOUNTER — Ambulatory Visit
Admission: RE | Admit: 2021-10-18 | Discharge: 2021-10-18 | Disposition: A | Discharge status: Home / Self Care | Payer: Medicare Other | Source: Ambulatory Visit | Attending: Radiation Oncology | Admitting: Radiation Oncology

## 2021-10-18 ENCOUNTER — Encounter: Payer: Self-pay | Admitting: Radiation Oncology

## 2021-10-18 DIAGNOSIS — C3411 Malignant neoplasm of upper lobe, right bronchus or lung: Secondary | ICD-10-CM | POA: Diagnosis not present

## 2021-10-18 LAB — RAD ONC ARIA SESSION SUMMARY
Course Elapsed Days: 10
Plan Fractions Treated to Date: 5
Plan Prescribed Dose Per Fraction: 12 Gy
Plan Total Fractions Prescribed: 5
Plan Total Prescribed Dose: 60 Gy
Reference Point Dosage Given to Date: 60 Gy
Reference Point Session Dosage Given: 12 Gy
Session Number: 5

## 2021-10-21 NOTE — Progress Notes (Signed)
                                                                                                                                                             Patient Name: Betty Anderson [facility] MRN: [id number] DOB: 06/05/1955 Referring Physician: Curt Bears (Profile Not Attached) Date of Service: 10/18/2021 [facility], [place]                                                        End Of Treatment Note  Diagnoses: C34.11-Malignant neoplasm of upper lobe, right bronchus or lung  Cancer Staging:  Progressive Stage IIIA, cT1bN2M0, NSCLC, cannot further classify of the RUL.  Intent: Curative  Radiation Treatment Dates: 10/08/2021 through 10/18/2021 Site Technique Total Dose (Gy) Dose per Fx (Gy) Completed Fx Beam Energies  Lung, Right: Lung_R_RUL IMRT 60/60 12 5/5 6XFFF   Narrative: The patient tolerated radiation therapy relatively well.    Plan: The patient will receive a call in about one month from the radiation oncology department. She will continue follow up with Dr. Julien Nordmann as well.   ________________________________________________    Carola Rhine, [facility]

## 2021-11-19 ENCOUNTER — Other Ambulatory Visit: Payer: Self-pay | Admitting: Gastroenterology

## 2021-11-19 DIAGNOSIS — R1310 Dysphagia, unspecified: Secondary | ICD-10-CM

## 2021-11-20 NOTE — Progress Notes (Signed)
  Radiation Oncology         [phone] ________________________________  Name: Betty Anderson MRN: [id number]  Date of Service: 11/25/2021  DOB: [birth year]  Post Treatment Telephone Note  Diagnosis:   Progressive Stage IIIA, [id number], NSCLC, cannot further classify of the RUL.  Intent: Curative  Radiation Treatment Dates:  10/08/2021 through 10/18/2021 SBRT Treatment Site Technique Total Dose (Gy) Dose per Fx (Gy) Completed Fx Beam Energies  Lung, Right: Lung_R_RUL IMRT 60/60 12 5/5 6XFFF   Narrative: The patient tolerated radiation therapy relatively well.     Impression/Plan: 1. Progressive Stage IIIA, cT1bN2M0, NSCLC, cannot further classify of the RUL.  I was unable to reach the patient but left a voicemail and on the message, I discussed that we would be happy to continue to follow her as needed, but she will also continue to follow up with Dr. Julien Nordmann in medical oncology for routine imaging.      Carola Rhine, PAC

## 2021-11-25 ENCOUNTER — Ambulatory Visit
Admission: RE | Admit: 2021-11-25 | Discharge: 2021-11-25 | Disposition: A | Discharge status: Home / Self Care | Payer: Medicare Other | Source: Ambulatory Visit | Attending: Internal Medicine | Admitting: Internal Medicine

## 2021-11-25 DIAGNOSIS — C3411 Malignant neoplasm of upper lobe, right bronchus or lung: Secondary | ICD-10-CM

## 2021-12-09 ENCOUNTER — Telehealth: Payer: Self-pay

## 2021-12-09 ENCOUNTER — Other Ambulatory Visit: Payer: Self-pay | Admitting: Internal Medicine

## 2021-12-09 MED ORDER — HYDROCODONE-ACETAMINOPHEN 7.5-325 MG PO TABS: 1.0000 | ORAL_TABLET | Freq: Three times a day (TID) | ORAL | 0 refills | Status: DC | PRN

## 2021-12-09 NOTE — Telephone Encounter (Signed)
T/C from pt stating her pain in her rt lung area has went from a 3 to a 10 today.  It is a constant, stabbing pain from the breast/lung area radiating around to her back . It is worse at night.    She has taken an expired (08/2020) prescription of hydrocodone/acetaminophen 7.5/325 mg per 15 ml which dulled the pain.  She has asked if we call in a prescription for her to please send it to [facility].  Please advise

## 2021-12-09 NOTE — Telephone Encounter (Signed)
Pt advised rx for pain medication has been sent to [facility] pharmacy.

## 2021-12-24 ENCOUNTER — Encounter (HOSPITAL_COMMUNITY): Payer: Self-pay

## 2021-12-24 ENCOUNTER — Inpatient Hospital Stay: Payer: Medicare Other | Attending: Internal Medicine

## 2021-12-24 ENCOUNTER — Ambulatory Visit (HOSPITAL_COMMUNITY)
Admission: RE | Admit: 2021-12-24 | Discharge: 2021-12-24 | Disposition: A | Discharge status: Home / Self Care | Payer: Medicare Other | Source: Ambulatory Visit | Attending: Internal Medicine | Admitting: Internal Medicine

## 2021-12-24 DIAGNOSIS — Z79899 Other long term (current) drug therapy: Secondary | ICD-10-CM | POA: Diagnosis not present

## 2021-12-24 DIAGNOSIS — C3490 Malignant neoplasm of unspecified part of unspecified bronchus or lung: Secondary | ICD-10-CM

## 2021-12-24 DIAGNOSIS — Z9221 Personal history of antineoplastic chemotherapy: Secondary | ICD-10-CM | POA: Diagnosis not present

## 2021-12-24 DIAGNOSIS — Z923 Personal history of irradiation: Secondary | ICD-10-CM | POA: Diagnosis not present

## 2021-12-24 DIAGNOSIS — J479 Bronchiectasis, uncomplicated: Secondary | ICD-10-CM | POA: Diagnosis not present

## 2021-12-24 DIAGNOSIS — R5383 Other fatigue: Secondary | ICD-10-CM | POA: Insufficient documentation

## 2021-12-24 DIAGNOSIS — Z888 Allergy status to other drugs, medicaments and biological substances status: Secondary | ICD-10-CM | POA: Insufficient documentation

## 2021-12-24 DIAGNOSIS — F17210 Nicotine dependence, cigarettes, uncomplicated: Secondary | ICD-10-CM | POA: Diagnosis not present

## 2021-12-24 DIAGNOSIS — I700 Atherosclerosis of aorta: Secondary | ICD-10-CM | POA: Insufficient documentation

## 2021-12-24 DIAGNOSIS — C3411 Malignant neoplasm of upper lobe, right bronchus or lung: Secondary | ICD-10-CM | POA: Insufficient documentation

## 2021-12-24 DIAGNOSIS — R0609 Other forms of dyspnea: Secondary | ICD-10-CM | POA: Insufficient documentation

## 2021-12-24 LAB — CMP (CANCER CENTER ONLY)
ALT: 6 U/L (ref 0–44)
AST: 10 U/L — ABNORMAL LOW (ref 15–41)
Albumin: 3.7 g/dL (ref 3.5–5.0)
Alkaline Phosphatase: 71 U/L (ref 38–126)
Anion gap: 4 — ABNORMAL LOW (ref 5–15)
BUN: 15 mg/dL (ref 8–23)
CO2: 28 mmol/L (ref 22–32)
Calcium: 9 mg/dL (ref 8.9–10.3)
Chloride: 106 mmol/L (ref 98–111)
Creatinine: 1.46 mg/dL — ABNORMAL HIGH (ref 0.44–1.00)
GFR, Estimated: 39 mL/min — ABNORMAL LOW (ref >60)
Glucose, Bld: 89 mg/dL (ref 70–99)
Potassium: 4 mmol/L (ref 3.5–5.1)
Sodium: 138 mmol/L (ref 135–145)
Total Bilirubin: 0.4 mg/dL (ref 0.3–1.2)
Total Protein: 7.5 g/dL (ref 6.5–8.1)

## 2021-12-24 LAB — CBC WITH DIFFERENTIAL (CANCER CENTER ONLY)
Abs Immature Granulocytes: 0.01 10*3/uL (ref 0.00–0.07)
Basophils Absolute: 0.1 10*3/uL (ref 0.0–0.1)
Basophils Relative: 1 %
Eosinophils Absolute: 0.2 10*3/uL (ref 0.0–0.5)
Eosinophils Relative: 4 %
HCT: 35.2 % — ABNORMAL LOW (ref 36.0–46.0)
Hemoglobin: 11.8 g/dL — ABNORMAL LOW (ref 12.0–15.0)
Immature Granulocytes: 0 %
Lymphocytes Relative: 17 %
Lymphs Abs: 0.8 10*3/uL (ref 0.7–4.0)
MCH: 31.8 pg (ref 26.0–34.0)
MCHC: 33.5 g/dL (ref 30.0–36.0)
MCV: 94.9 fL (ref 80.0–100.0)
Monocytes Absolute: 0.4 10*3/uL (ref 0.1–1.0)
Monocytes Relative: 9 %
Neutro Abs: 3.2 10*3/uL (ref 1.7–7.7)
Neutrophils Relative %: 69 %
Platelet Count: 283 10*3/uL (ref 150–400)
RBC: 3.71 MIL/uL — ABNORMAL LOW (ref 3.87–5.11)
RDW: 14.1 % (ref 11.5–15.5)
WBC Count: 4.5 10*3/uL (ref 4.0–10.5)
nRBC: 0 % (ref 0.0–0.2)

## 2021-12-24 MED ORDER — SODIUM CHLORIDE (PF) 0.9 % IJ SOLN
INTRAMUSCULAR | Status: AC
  Filled 2021-12-24: qty 50

## 2021-12-24 MED ORDER — IOHEXOL 300 MG/ML  SOLN
60.0000 mL | Freq: Once | INTRAMUSCULAR | Status: AC | PRN
  Administered 2021-12-24: 60 mL via INTRAVENOUS

## 2021-12-26 ENCOUNTER — Other Ambulatory Visit: Payer: Self-pay

## 2021-12-26 ENCOUNTER — Inpatient Hospital Stay (HOSPITAL_BASED_OUTPATIENT_CLINIC_OR_DEPARTMENT_OTHER): Payer: Medicare Other | Admitting: Internal Medicine

## 2021-12-26 VITALS — BP 105/65 | HR 81 | Temp 97.7°F | Resp 16 | Wt 165.5 lb

## 2021-12-26 DIAGNOSIS — C3411 Malignant neoplasm of upper lobe, right bronchus or lung: Secondary | ICD-10-CM | POA: Diagnosis not present

## 2021-12-26 DIAGNOSIS — C3490 Malignant neoplasm of unspecified part of unspecified bronchus or lung: Secondary | ICD-10-CM | POA: Diagnosis not present

## 2021-12-26 NOTE — Progress Notes (Signed)
Pain     [place] [phone]   Fax:(336) [place], [place] [address]  DIAGNOSIS: Stage IIIA (T1b, N2, M0) non-small cell lung cancer presented with right upper lobe lung nodule in addition to mediastinal lymphadenopathy diagnosed in January 2021.  PRIOR THERAPY:   1) Concurrent chemoradiation with weekly carboplatin for AUC of 2 and paclitaxel 45 NG/M2.  Status post 5 cycles. Last dose received on 05/30/19. 2) Consolidation immunotherapy with Imfinzi 1500 mg/kg IV every 4 weeks.  First dose expected on 07/21/2019. Status post 13 cycles. 3) SBRT to the large right upper lobe pulmonary nodules under the care of Dr. Lisbeth Renshaw completed October 18, 2021.   CURRENT THERAPY: Observation.  INTERVAL HISTORY: Betty Anderson 66 y.o. female returns to the clinic today for follow-up visit accompanied by her husband.  The patient continues to smoke few packs every day.  She denied having any current chest pain but has shortness of breath at baseline increased with exertion with cough with no hemoptysis.  She underwent SBRT to the enlarging right upper lobe pulmonary nodule under the care of Dr. Lisbeth Renshaw completed on October 18, 2021.  She tolerated the procedure fairly well.  She denied having any nausea, vomiting, diarrhea or constipation.  She has no headache or visual changes.  She has no recent weight loss or night sweats.  She is here today for evaluation with repeat CT scan of the chest for restaging of her disease.  MEDICAL HISTORY: Past Medical History:  Diagnosis Date   COPD (chronic obstructive pulmonary disease) ([place])    emphysema   Hyperlipidemia    Hypertension    lung ca dx'd 03/2019   Lung nodule    Paroxysmal atrial flutter ([facility])    per cardiology note   Peripheral artery disease ([place])    Tobacco abuse    Vitamin D deficiency    Wears glasses     ALLERGIES:  is allergic to  lisinopril.  MEDICATIONS:  Current Outpatient Medications  Medication Sig Dispense Refill   aspirin EC 81 MG tablet Take 1 tablet (81 mg total) by mouth daily. 90 tablet 3   Cholecalciferol (DIALYVITE VITAMIN D 5000) 125 MCG (5000 UT) capsule Take 5,000 Units by mouth daily.     escitalopram (LEXAPRO) 10 MG tablet Take 10 mg by mouth daily.     guaiFENesin (MUCINEX) 600 MG 12 hr tablet Take by mouth 2 (two) times daily.     HYDROcodone-acetaminophen (NORCO) 7.5-325 MG tablet Take 1 tablet by mouth 3 (three) times daily as needed for moderate pain. 30 tablet 0   pantoprazole (PROTONIX) 40 MG tablet TAKE ONE TABLET BY MOUTH TWICE A DAY 180 tablet 2   rosuvastatin (CRESTOR) 20 MG tablet Take 20 mg by mouth every evening.      Tiotropium Bromide-Olodaterol (STIOLTO RESPIMAT) 2.5-2.5 MCG/ACT AERS Inhale 2 puffs into the lungs daily. 4 g 0   vitamin B-12 (CYANOCOBALAMIN) 1000 MCG tablet Take 1,000 mcg by mouth daily.     No current facility-administered medications for this visit.    SURGICAL HISTORY:  Past Surgical History:  Procedure Laterality Date   BRONCHIAL NEEDLE ASPIRATION BIOPSY  04/12/2019   Procedure: BRONCHIAL NEEDLE ASPIRATION BIOPSIES;  Surgeon: Garner Nash, DO;  Location: [facility];  Service: Cardiopulmonary;;   ENDOBRONCHIAL ULTRASOUND N/A 04/12/2019   Procedure: ENDOBRONCHIAL ULTRASOUND;  Surgeon: Garner Nash, DO;  Location: [facility];  Service: Cardiopulmonary;  Laterality: N/A;  OVARIAN CYST REMOVAL     TONSILLECTOMY     TUBAL LIGATION     VIDEO BRONCHOSCOPY N/A 04/12/2019   Procedure: VIDEO BRONCHOSCOPY WITHOUT FLUORO;  Surgeon: Garner Nash, DO;  Location: [facility];  Service: Cardiopulmonary;  Laterality: N/A;    REVIEW OF SYSTEMS:  A comprehensive review of systems was negative except for: Constitutional: positive for fatigue Respiratory: positive for dyspnea on exertion   PHYSICAL EXAMINATION: General appearance: alert, cooperative, fatigued,  and no distress Head: Normocephalic, without obvious abnormality, atraumatic Neck: no adenopathy, no JVD, supple, symmetrical, trachea midline, and thyroid not enlarged, symmetric, no tenderness/mass/nodules Lymph nodes: Cervical, supraclavicular, and axillary nodes normal. Resp: clear to auscultation bilaterally Back: symmetric, no curvature. ROM normal. No CVA tenderness. Cardio: regular rate and rhythm, S1, S2 normal, no murmur, click, rub or gallop GI: soft, non-tender; bowel sounds normal; no masses,  no organomegaly Extremities: extremities normal, atraumatic, no cyanosis or edema  ECOG PERFORMANCE STATUS: 1 - Symptomatic but completely ambulatory  Blood pressure 105/65, pulse 81, temperature 97.7 F (36.5 C), temperature source Oral, resp. rate 16, weight 165 lb 8 oz (75.1 kg), SpO2 99 %.  LABORATORY DATA: Lab Results  Component Value Date   WBC 4.5 12/24/2021   HGB 11.8 (L) 12/24/2021   HCT 35.2 (L) 12/24/2021   MCV 94.9 12/24/2021   PLT 283 12/24/2021      Chemistry      Component Value Date/Time   NA 138 12/24/2021 1124   K 4.0 12/24/2021 1124   CL 106 12/24/2021 1124   CO2 28 12/24/2021 1124   BUN 15 12/24/2021 1124   CREATININE 1.46 (H) 12/24/2021 1124      Component Value Date/Time   CALCIUM 9.0 12/24/2021 1124   ALKPHOS 71 12/24/2021 1124   AST 10 (L) 12/24/2021 1124   ALT 6 12/24/2021 1124   BILITOT 0.4 12/24/2021 1124       RADIOGRAPHIC STUDIES: CT Chest W Contrast  Result Date: 12/25/2021 CLINICAL DATA:  Non-small cell lung cancer treated with chemotherapy, radiation therapy and immunotherapy. * Tracking Code: BO * EXAM: CT CHEST WITH CONTRAST TECHNIQUE: Multidetector CT imaging of the chest was performed during intravenous contrast administration. RADIATION DOSE REDUCTION: This exam was performed according to the departmental dose-optimization program which includes automated exposure control, adjustment of the mA and/or kV according to patient size  and/or use of iterative reconstruction technique. CONTRAST:  67mL OMNIPAQUE IOHEXOL 300 MG/ML  SOLN COMPARISON:  PET 09/05/2021 and CT chest 08/22/2021, 04/26/2021. FINDINGS: Cardiovascular: Atherosclerotic narrowing of the left common carotid artery. Atherosclerotic calcification of the aorta, aortic valve and coronary arteries. Heart size normal. No pericardial effusion. Mediastinum/Nodes: No pathologically enlarged mediastinal, hilar or axillary lymph nodes. Esophagus is grossly unremarkable. Lungs/Pleura: Centrilobular and paraseptal emphysema. Ground-glass, architectural distortion and bronchiectasis in the right upper lobe, related to radiation therapy. 0.6 x 1.3 cm subpleural nodular lesion in the posterior right upper lobe (5/39) has increased in size slightly, best compared to 04/26/2021, at which time it measured 3 x 6 mm. Mild basilar mosaic ground-glass and probable subpleural reticular densities and ground-glass. Subpleural scarring in the posteromedial right lower lobe. No suspicious pulmonary nodules. No pleural fluid. Airway is unremarkable. Upper Abdomen: Visualized portions of the liver, gallbladder, adrenal glands, kidneys, spleen, pancreas, stomach and bowel are grossly unremarkable. No upper abdominal adenopathy. Musculoskeletal: Degenerative changes in the spine. No worrisome lytic or sclerotic lesions. IMPRESSION: 1. Enlarging subpleural nodular consolidation in the posterior right upper lobe, hypermetabolic on recent  PET and worrisome for disease recurrence. 2. Basilar predominant ground-glass and probable mild subpleural reticulation raise suspicion for interstitial lung disease. 3. Aortic atherosclerosis (ICD10-I70.0). Associated narrowing of the left common carotid artery. Coronary artery calcification. 4.  Emphysema (ICD10-J43.9). Electronically Signed   By: Lorin Picket M.D.   On: 12/25/2021 15:16     ASSESSMENT AND PLAN: This is a very pleasant 66 years old white female with stage  IIIA non-small cell lung cancer, presented with right upper lobe lung nodule and mediastinal lymphadenopathy diagnosed in January 2021.   She completed a course of concurrent chemoradiation with weekly carboplatin and paclitaxel for 5 cycles and tolerated her treatment well.  She has partial response. The patient underwent consolidation treatment with Imfinzi 1500 mg IV every 4 weeks status post 13 cycles.  She tolerated this treatment well with no concerning adverse effects. She also underwent SBRT to enlarging right upper lobe pulmonary nodules under the care of Dr. Lisbeth Renshaw. Her last CT scan of the chest on Aug 22, 2021 showed no concerning findings for disease progression except for enlargement of the nodular area along the pleural surface in the right upper lobe concerning for disease recurrence this was followed by a PET scan on 09/05/2021 and that showed increased radiotracer uptake in the nodular density within the right upper lobe concerning for new site of metabolically active tumor.  The patient underwent curative radiotherapy to this lesion with SBRT under the care of Dr. Lisbeth Renshaw completed on October 18, 2021. The patient had repeat CT scan of the chest performed recently.  I personally and independently reviewed the scan images and discussed the results with the patient and her husband. Her scan showed enlarging subpleural nodular consolidation in the posterior right upper lobe that was hypermetabolic on the previous PET scan but the patient received SBRT in the interval and this is could be secondary to radiation-induced pneumonitis and fibrosis in this area. I recommended for the patient to continue on observation for now with repeat CT scan of the chest in 3 months. Again I strongly encouraged the patient to quit smoking but she is declining. She was advised to call immediately if she has any other concerning symptoms in the interval. The patient voices understanding of current disease status and  treatment options and is in agreement with the current care plan.  All questions were answered. The patient knows to call the clinic with any problems, questions or concerns. We can certainly see the patient much sooner if necessary.   Disclaimer: This note was dictated with voice recognition software. Similar sounding words can inadvertently be transcribed and may not be corrected upon review.

## 2022-02-15 ENCOUNTER — Telehealth: Payer: Self-pay | Admitting: Internal Medicine

## 2022-02-15 NOTE — Telephone Encounter (Signed)
Called patient regarding upcoming December appointments, no voicemail was set up. Calendar will be mailed.

## 2022-03-25 ENCOUNTER — Ambulatory Visit (HOSPITAL_COMMUNITY)
Admission: RE | Admit: 2022-03-25 | Discharge: 2022-03-25 | Disposition: A | Discharge status: Home / Self Care | Payer: Medicare Other | Source: Ambulatory Visit | Attending: Internal Medicine | Admitting: Internal Medicine

## 2022-03-25 ENCOUNTER — Inpatient Hospital Stay: Payer: Medicare Other | Attending: Internal Medicine

## 2022-03-25 DIAGNOSIS — I700 Atherosclerosis of aorta: Secondary | ICD-10-CM | POA: Diagnosis not present

## 2022-03-25 DIAGNOSIS — C3412 Malignant neoplasm of upper lobe, left bronchus or lung: Secondary | ICD-10-CM | POA: Diagnosis not present

## 2022-03-25 DIAGNOSIS — I2510 Atherosclerotic heart disease of native coronary artery without angina pectoris: Secondary | ICD-10-CM | POA: Insufficient documentation

## 2022-03-25 DIAGNOSIS — R918 Other nonspecific abnormal finding of lung field: Secondary | ICD-10-CM | POA: Diagnosis not present

## 2022-03-25 DIAGNOSIS — Z79899 Other long term (current) drug therapy: Secondary | ICD-10-CM | POA: Insufficient documentation

## 2022-03-25 DIAGNOSIS — C3490 Malignant neoplasm of unspecified part of unspecified bronchus or lung: Secondary | ICD-10-CM

## 2022-03-25 LAB — CBC WITH DIFFERENTIAL (CANCER CENTER ONLY)
Abs Immature Granulocytes: 0.02 10*3/uL (ref 0.00–0.07)
Basophils Absolute: 0.1 10*3/uL (ref 0.0–0.1)
Basophils Relative: 1 %
Eosinophils Absolute: 0.1 10*3/uL (ref 0.0–0.5)
Eosinophils Relative: 2 %
HCT: 34.6 % — ABNORMAL LOW (ref 36.0–46.0)
Hemoglobin: 11.6 g/dL — ABNORMAL LOW (ref 12.0–15.0)
Immature Granulocytes: 0 %
Lymphocytes Relative: 17 %
Lymphs Abs: 0.9 10*3/uL (ref 0.7–4.0)
MCH: 31.5 pg (ref 26.0–34.0)
MCHC: 33.5 g/dL (ref 30.0–36.0)
MCV: 94 fL (ref 80.0–100.0)
Monocytes Absolute: 0.4 10*3/uL (ref 0.1–1.0)
Monocytes Relative: 8 %
Neutro Abs: 3.9 10*3/uL (ref 1.7–7.7)
Neutrophils Relative %: 72 %
Platelet Count: 286 10*3/uL (ref 150–400)
RBC: 3.68 MIL/uL — ABNORMAL LOW (ref 3.87–5.11)
RDW: 13.3 % (ref 11.5–15.5)
WBC Count: 5.5 10*3/uL (ref 4.0–10.5)
nRBC: 0 % (ref 0.0–0.2)

## 2022-03-25 LAB — CMP (CANCER CENTER ONLY)
ALT: 5 U/L (ref 0–44)
AST: 10 U/L — ABNORMAL LOW (ref 15–41)
Albumin: 3.6 g/dL (ref 3.5–5.0)
Alkaline Phosphatase: 82 U/L (ref 38–126)
Anion gap: 7 (ref 5–15)
BUN: 17 mg/dL (ref 8–23)
CO2: 25 mmol/L (ref 22–32)
Calcium: 9.1 mg/dL (ref 8.9–10.3)
Chloride: 105 mmol/L (ref 98–111)
Creatinine: 1.45 mg/dL — ABNORMAL HIGH (ref 0.44–1.00)
GFR, Estimated: 39 mL/min — ABNORMAL LOW (ref >60)
Glucose, Bld: 100 mg/dL — ABNORMAL HIGH (ref 70–99)
Potassium: 4 mmol/L (ref 3.5–5.1)
Sodium: 137 mmol/L (ref 135–145)
Total Bilirubin: 0.3 mg/dL (ref 0.3–1.2)
Total Protein: 7.3 g/dL (ref 6.5–8.1)

## 2022-03-25 MED ORDER — IOHEXOL 300 MG/ML  SOLN
65.0000 mL | Freq: Once | INTRAMUSCULAR | Status: AC | PRN
  Administered 2022-03-25: 65 mL via INTRAVENOUS

## 2022-03-27 ENCOUNTER — Ambulatory Visit: Payer: Medicare Other | Admitting: Internal Medicine

## 2022-04-01 ENCOUNTER — Inpatient Hospital Stay: Payer: Medicare Other | Attending: Physician Assistant | Admitting: Internal Medicine

## 2022-04-01 VITALS — BP 117/71 | HR 84 | Temp 98.0°F | Resp 18 | Wt 165.3 lb

## 2022-04-01 DIAGNOSIS — F17200 Nicotine dependence, unspecified, uncomplicated: Secondary | ICD-10-CM | POA: Insufficient documentation

## 2022-04-01 DIAGNOSIS — R5383 Other fatigue: Secondary | ICD-10-CM | POA: Diagnosis not present

## 2022-04-01 DIAGNOSIS — Z888 Allergy status to other drugs, medicaments and biological substances status: Secondary | ICD-10-CM | POA: Diagnosis not present

## 2022-04-01 DIAGNOSIS — Z923 Personal history of irradiation: Secondary | ICD-10-CM | POA: Diagnosis not present

## 2022-04-01 DIAGNOSIS — I700 Atherosclerosis of aorta: Secondary | ICD-10-CM | POA: Insufficient documentation

## 2022-04-01 DIAGNOSIS — R059 Cough, unspecified: Secondary | ICD-10-CM | POA: Diagnosis not present

## 2022-04-01 DIAGNOSIS — C3490 Malignant neoplasm of unspecified part of unspecified bronchus or lung: Secondary | ICD-10-CM | POA: Diagnosis not present

## 2022-04-01 DIAGNOSIS — Z79899 Other long term (current) drug therapy: Secondary | ICD-10-CM | POA: Insufficient documentation

## 2022-04-01 DIAGNOSIS — Z9221 Personal history of antineoplastic chemotherapy: Secondary | ICD-10-CM | POA: Diagnosis not present

## 2022-04-01 DIAGNOSIS — C3411 Malignant neoplasm of upper lobe, right bronchus or lung: Secondary | ICD-10-CM | POA: Diagnosis not present

## 2022-04-01 DIAGNOSIS — R0609 Other forms of dyspnea: Secondary | ICD-10-CM | POA: Diagnosis not present

## 2022-04-01 MED ORDER — AZITHROMYCIN 250 MG PO TABS: ORAL_TABLET | ORAL | 0 refills | Status: DC

## 2022-04-01 NOTE — Progress Notes (Signed)
Pain     [facility] [phone]   Fax:(336) [place], [place] [address]  DIAGNOSIS: Stage IIIA (T1b, N2, M0) non-small cell lung cancer presented with right upper lobe lung nodule in addition to mediastinal lymphadenopathy diagnosed in January 2021.  PRIOR THERAPY:   1) Concurrent chemoradiation with weekly carboplatin for AUC of 2 and paclitaxel 45 NG/M2.  Status post 5 cycles. Last dose received on 05/30/19. 2) Consolidation immunotherapy with Imfinzi 1500 mg/kg IV every 4 weeks.  First dose expected on 07/21/2019. Status post 13 cycles. 3) SBRT to the large right upper lobe pulmonary nodules under the care of Dr. Lisbeth Renshaw completed October 18, 2021.   CURRENT THERAPY: Observation.  INTERVAL HISTORY: Betty Anderson 67 y.o. female returns to the clinic today for follow-up visit accompanied by her husband.  The patient is feeling fine today with no concerning complaints except for the baseline shortness of breath and cough.  She denied having any fever or chills.  She has no chest pain or hemoptysis.  She has no nausea, vomiting, diarrhea or constipation.  She has no headache or visual changes.  Unfortunately she continues to smoke and unwilling to quit.  She had repeat CT scan of the chest performed recently and she is here for evaluation and discussion of her scan results.  MEDICAL HISTORY: Past Medical History:  Diagnosis Date   COPD (chronic obstructive pulmonary disease) ([place])    emphysema   Hyperlipidemia    Hypertension    lung ca dx'd 03/2019   Lung nodule    Paroxysmal atrial flutter ([facility])    per cardiology note   Peripheral artery disease ([place])    Tobacco abuse    Vitamin D deficiency    Wears glasses     ALLERGIES:  is allergic to lisinopril.  MEDICATIONS:  Current Outpatient Medications  Medication Sig Dispense Refill   aspirin EC 81 MG tablet Take 1 tablet (81 mg total) by mouth  daily. 90 tablet 3   Cholecalciferol (DIALYVITE VITAMIN D 5000) 125 MCG (5000 UT) capsule Take 5,000 Units by mouth daily.     escitalopram (LEXAPRO) 10 MG tablet Take 10 mg by mouth daily.     guaiFENesin (MUCINEX) 600 MG 12 hr tablet Take by mouth 2 (two) times daily.     HYDROcodone-acetaminophen (NORCO) 7.5-325 MG tablet Take 1 tablet by mouth 3 (three) times daily as needed for moderate pain. 30 tablet 0   pantoprazole (PROTONIX) 40 MG tablet TAKE ONE TABLET BY MOUTH TWICE A DAY 180 tablet 2   rosuvastatin (CRESTOR) 20 MG tablet Take 20 mg by mouth every evening.      Tiotropium Bromide-Olodaterol (STIOLTO RESPIMAT) 2.5-2.5 MCG/ACT AERS Inhale 2 puffs into the lungs daily. 4 g 0   vitamin B-12 (CYANOCOBALAMIN) 1000 MCG tablet Take 1,000 mcg by mouth daily.     No current facility-administered medications for this visit.    SURGICAL HISTORY:  Past Surgical History:  Procedure Laterality Date   BRONCHIAL NEEDLE ASPIRATION BIOPSY  04/12/2019   Procedure: BRONCHIAL NEEDLE ASPIRATION BIOPSIES;  Surgeon: Garner Nash, DO;  Location: [facility];  Service: Cardiopulmonary;;   ENDOBRONCHIAL ULTRASOUND N/A 04/12/2019   Procedure: ENDOBRONCHIAL ULTRASOUND;  Surgeon: Garner Nash, DO;  Location: [facility];  Service: Cardiopulmonary;  Laterality: N/A;   OVARIAN CYST REMOVAL     TONSILLECTOMY     TUBAL LIGATION     VIDEO BRONCHOSCOPY N/A 04/12/2019  Procedure: VIDEO BRONCHOSCOPY WITHOUT FLUORO;  Surgeon: Garner Nash, DO;  Location: [facility];  Service: Cardiopulmonary;  Laterality: N/A;    REVIEW OF SYSTEMS:  Constitutional: positive for fatigue Eyes: negative Ears, nose, mouth, throat, and face: negative Respiratory: positive for cough and dyspnea on exertion Cardiovascular: negative Gastrointestinal: negative Genitourinary:negative Integument/breast: negative Hematologic/lymphatic: negative Musculoskeletal:negative Neurological: negative Behavioral/Psych:  negative Endocrine: negative Allergic/Immunologic: negative   PHYSICAL EXAMINATION: General appearance: alert, cooperative, fatigued, and no distress Head: Normocephalic, without obvious abnormality, atraumatic Neck: no adenopathy, no JVD, supple, symmetrical, trachea midline, and thyroid not enlarged, symmetric, no tenderness/mass/nodules Lymph nodes: Cervical, supraclavicular, and axillary nodes normal. Resp: clear to auscultation bilaterally Back: symmetric, no curvature. ROM normal. No CVA tenderness. Cardio: regular rate and rhythm, S1, S2 normal, no murmur, click, rub or gallop GI: soft, non-tender; bowel sounds normal; no masses,  no organomegaly Extremities: extremities normal, atraumatic, no cyanosis or edema Neurologic: Alert and oriented X 3, normal strength and tone. Normal symmetric reflexes. Normal coordination and gait  ECOG PERFORMANCE STATUS: 1 - Symptomatic but completely ambulatory  Blood pressure 105/65, pulse 81, temperature 97.7 F (36.5 C), temperature source Oral, resp. rate 16, weight 165 lb 8 oz (75.1 kg), SpO2 99 %.  LABORATORY DATA: Lab Results  Component Value Date   WBC 4.5 12/24/2021   HGB 11.8 (L) 12/24/2021   HCT 35.2 (L) 12/24/2021   MCV 94.9 12/24/2021   PLT 283 12/24/2021      Chemistry      Component Value Date/Time   NA 138 12/24/2021 1124   K 4.0 12/24/2021 1124   CL 106 12/24/2021 1124   CO2 28 12/24/2021 1124   BUN 15 12/24/2021 1124   CREATININE 1.46 (H) 12/24/2021 1124      Component Value Date/Time   CALCIUM 9.0 12/24/2021 1124   ALKPHOS 71 12/24/2021 1124   AST 10 (L) 12/24/2021 1124   ALT 6 12/24/2021 1124   BILITOT 0.4 12/24/2021 1124       RADIOGRAPHIC STUDIES: CT Chest W Contrast  Result Date: 03/26/2022 CLINICAL DATA:  Non-small cell lung cancer staging evaluation in a 67 year old female. * Tracking Code: BO * EXAM: CT CHEST WITH CONTRAST TECHNIQUE: Multidetector CT imaging of the chest was performed during  intravenous contrast administration. RADIATION DOSE REDUCTION: This exam was performed according to the departmental dose-optimization program which includes automated exposure control, adjustment of the mA and/or kV according to patient size and/or use of iterative reconstruction technique. CONTRAST:  34mL OMNIPAQUE IOHEXOL 300 MG/ML  SOLN COMPARISON:  December 24, 2021 FINDINGS: Cardiovascular: Calcified and noncalcified aortic atherosclerotic plaque without dilation similar to prior imaging. 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 Central pulmonary vasculature is unremarkable on venous phase. No pericardial effusion or sign of pericardial nodularity. Calcified coronary artery disease as on previous imaging. Mediastinum/Nodes: Soft tissue thickening and stranding in subcarinal region is unchanged measuring 11 mm short axis. No discrete adenopathy in the chest. Scattered small lymph nodes throughout the mediastinum without change. Lungs/Pleura: Nodular area in the RIGHT upper lobe along the periphery the posterolateral chest 13 mm, not changed since previous imaging. Extensive pleural and parenchymal scarring with septal thickening and bandlike changes extending from the RIGHT hilum to the RIGHT lung apex showing a similar appearance. Diffuse indistinct centrilobular nodularity has developed since previous imaging with subtle ground-glass nodules. New more discrete nodule in the LEFT upper lobe (image 39/5) 4 mm. Signs of ground-glass and septal thickening throughout the  remainder of the chest showing a similar appearance when compared to previous imaging. Upper Abdomen: Imaged portions the liver, pancreas, spleen, adrenal glands and kidneys are unremarkable to the extent evaluated. No acute gastrointestinal findings to the extent evaluated. Musculoskeletal: Spinal degenerative changes. No acute or destructive bone process. IMPRESSION: 1. Diffuse indistinct centrilobular  nodularity has developed since previous imaging with subtle ground-glass nodules. Findings are favored to represent an infectious or inflammatory process perhaps even respiratory bronchiolitis. 2. New, more discrete nodule in the LEFT upper lobe 4 mm. New since previous imaging, perhaps related to above process but warranting close attention on short interval follow-up. 3. Post treatment changes in the RIGHT upper lobe and peripheral area of nodularity as discussed, no interval change since previous imaging. 4. Soft tissue thickening in the mediastinum with scattered small lymph nodes, unchanged. 5. 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 6. Aortic atherosclerosis and coronary artery disease. Aortic Atherosclerosis (ICD10-I70.0). Electronically Signed   By: Zetta Bills M.D.   On: 03/26/2022 15:32     ASSESSMENT AND PLAN: This is a very pleasant 67 years old white female with stage IIIA non-small cell lung cancer, presented with right upper lobe lung nodule and mediastinal lymphadenopathy diagnosed in January 2021.   She completed a course of concurrent chemoradiation with weekly carboplatin and paclitaxel for 5 cycles and tolerated her treatment well.  She has partial response. The patient underwent consolidation treatment with Imfinzi 1500 mg IV every 4 weeks status post 13 cycles.  She tolerated this treatment well with no concerning adverse effects. She also underwent SBRT to enlarging right upper lobe pulmonary nodules under the care of Dr. Lisbeth Renshaw. Her last CT scan of the chest on Aug 22, 2021 showed no concerning findings for disease progression except for enlargement of the nodular area along the pleural surface in the right upper lobe concerning for disease recurrence this was followed by a PET scan on 09/05/2021 and that showed increased radiotracer uptake in the nodular density within the right upper lobe concerning for new site of metabolically active  tumor.  The patient underwent curative radiotherapy to this lesion with SBRT under the care of Dr. Lisbeth Renshaw completed on October 18, 2021. The patient is currently on observation and she is feeling fine with no concerning complaints except for the baseline shortness of breath and cough with sputum production.  She denied having any fever or chills. She had repeat CT scan of the chest performed recently.  I personally and independently reviewed the scan images and discussed the results with the patient and her husband. Her scan showed no concerning findings for disease progression but she continues to have diffuse indistinct centrilobular nodularity with several groundglass nodules suspicious for infectious/inflammatory process.  There was also new more discrete nodule in the left upper lobe measuring 4 mm. I recommended for the patient to continue on observation with repeat CT scan of the chest in 3 months for further evaluation of this abnormality especially is a new 4 mm nodule. For the suspicious inflammatory process in the lung, I will start the patient on Z-Pak. She was also advised to call immediately if she has any other concerning symptoms in the interval.  For the smoking cessation I discussed again with the patient but she is not willing to quit smoking.  The patient voices understanding of current disease status and treatment options and is in agreement with the current care plan.  All questions were answered.  The patient knows to call the clinic with any problems, questions or concerns. We can certainly see the patient much sooner if necessary.  The total time spent in the appointment was 30 minutes.  Disclaimer: This note was dictated with voice recognition software. Similar sounding words can inadvertently be transcribed and may not be corrected upon review.

## 2022-05-06 ENCOUNTER — Other Ambulatory Visit (HOSPITAL_COMMUNITY): Payer: Self-pay | Admitting: Internal Medicine

## 2022-05-06 DIAGNOSIS — I6529 Occlusion and stenosis of unspecified carotid artery: Secondary | ICD-10-CM

## 2022-05-07 ENCOUNTER — Ambulatory Visit (HOSPITAL_COMMUNITY)
Admission: RE | Admit: 2022-05-07 | Discharge: 2022-05-07 | Disposition: A | Discharge status: Home / Self Care | Payer: Medicare Other | Source: Ambulatory Visit | Attending: Vascular Surgery | Admitting: Vascular Surgery

## 2022-05-07 DIAGNOSIS — I6529 Occlusion and stenosis of unspecified carotid artery: Secondary | ICD-10-CM | POA: Diagnosis not present

## 2022-05-13 ENCOUNTER — Ambulatory Visit: Payer: Medicare Other | Admitting: Vascular Surgery

## 2022-05-13 ENCOUNTER — Encounter: Payer: Self-pay | Admitting: Vascular Surgery

## 2022-05-13 VITALS — BP 92/61 | HR 78 | Temp 97.8°F | Resp 16 | Ht 63.0 in | Wt 163.0 lb

## 2022-05-13 DIAGNOSIS — I779 Disorder of arteries and arterioles, unspecified: Secondary | ICD-10-CM | POA: Insufficient documentation

## 2022-05-13 DIAGNOSIS — I6523 Occlusion and stenosis of bilateral carotid arteries: Secondary | ICD-10-CM | POA: Diagnosis not present

## 2022-05-13 NOTE — Progress Notes (Signed)
Patient name: Betty Anderson MRN: [id number] DOB: 1956/03/21 Sex: female  REASON FOR CONSULT: Carotid artery disease   HPI: Betty Anderson is a 67 y.o. female,  with history of COPD, lung cancer, and tobacco abuse that presents for evaluation of carotid artery disease.  This has been followed by her PCP Dr. Joylene Draft.  She denies any history of strokes or TIAs.  Her most recent carotid ultrasound on 05/07/2022 showed a 1 to 39% right ICA stenosis with a 60 to 79% left ICA stenosis with evidence of common carotid disease.  She states she smokes about 3 packs/day.  She states she has some dizziness that has been ongoing for years usually after she sits up and this is no worse.  Past Medical History:  Diagnosis Date   COPD (chronic obstructive pulmonary disease) ([place])    emphysema   Hyperlipidemia    Hypertension    lung ca dx'd 03/2019   Lung nodule    Paroxysmal atrial flutter ([place])    per cardiology note   Peripheral artery disease ([place])    Tobacco abuse    Vitamin D deficiency    Wears glasses     Past Surgical History:  Procedure Laterality Date   BRONCHIAL NEEDLE ASPIRATION BIOPSY  04/12/2019   Procedure: BRONCHIAL NEEDLE ASPIRATION BIOPSIES;  Surgeon: Garner Nash, DO;  Location: [facility];  Service: Cardiopulmonary;;   ENDOBRONCHIAL ULTRASOUND N/A 04/12/2019   Procedure: ENDOBRONCHIAL ULTRASOUND;  Surgeon: Garner Nash, DO;  Location: [facility];  Service: Cardiopulmonary;  Laterality: N/A;   OVARIAN CYST REMOVAL     TONSILLECTOMY     TUBAL LIGATION     VIDEO BRONCHOSCOPY N/A 04/12/2019   Procedure: VIDEO BRONCHOSCOPY WITHOUT FLUORO;  Surgeon: Garner Nash, DO;  Location: [facility];  Service: Cardiopulmonary;  Laterality: N/A;    Family History  Problem Relation Age of Onset   Stroke Mother    Stroke Father    Lung cancer Father    Mesothelioma Brother    Lung cancer Sister    Colon cancer Sister    Anuerysm Brother     SOCIAL  HISTORY: Social History   Socioeconomic History   Marital status: Married    Spouse name: Not on file   Number of children: 3   Years of education: Not on file   Highest education level: Not on file  Occupational History   Not on file  Tobacco Use   Smoking status: Every Day    Packs/day: 3.50    Years: 50.00    Total pack years: 175.00    Types: Cigarettes    Start date: 03/31/1962   Smokeless tobacco: Never   Tobacco comments:    Pt currently smoking 2ppd  Vaping Use   Vaping Use: Never used  Substance and Sexual Activity   Alcohol use: Yes    Alcohol/week: 8.0 standard drinks of alcohol    Types: 8 Shots of liquor per week    Comment: " Mixed drinks daily"   Drug use: Never   Sexual activity: Not on file  Other Topics Concern   Not on file  Social History Narrative   Not on file   Social Determinants of Health   Financial Resource Strain: Not on file  Food Insecurity: Not on file  Transportation Needs: Not on file  Physical Activity: Not on file  Stress: Not on file  Social Connections: Not on file  Intimate Partner Violence: Not on file    Allergies  Allergen Reactions   Lisinopril Cough    Current Outpatient Medications  Medication Sig Dispense Refill   aspirin EC 81 MG tablet Take 1 tablet (81 mg total) by mouth daily. 90 tablet 3   Cholecalciferol (DIALYVITE VITAMIN D 5000) 125 MCG (5000 UT) capsule Take 5,000 Units by mouth daily.     escitalopram (LEXAPRO) 10 MG tablet Take 10 mg by mouth daily.     guaiFENesin (MUCINEX) 600 MG 12 hr tablet Take by mouth 2 (two) times daily.     HYDROcodone-acetaminophen (NORCO) 7.5-325 MG tablet Take 1 tablet by mouth 3 (three) times daily as needed for moderate pain. 30 tablet 0   pantoprazole (PROTONIX) 40 MG tablet TAKE ONE TABLET BY MOUTH TWICE A DAY 180 tablet 2   rosuvastatin (CRESTOR) 20 MG tablet Take 20 mg by mouth every evening.      Tiotropium Bromide-Olodaterol (STIOLTO RESPIMAT) 2.5-2.5 MCG/ACT AERS  Inhale 2 puffs into the lungs daily. 4 g 0   vitamin B-12 (CYANOCOBALAMIN) 1000 MCG tablet Take 1,000 mcg by mouth daily.     azithromycin (ZITHROMAX) 250 MG tablet Use as instructed. 6 each 0   No current facility-administered medications for this visit.    REVIEW OF SYSTEMS:  [X]  denotes positive finding, [ ]  denotes negative finding Cardiac  Comments:  Chest pain or chest pressure:    Shortness of breath upon exertion:    Short of breath when lying flat:    Irregular heart rhythm:        Vascular    Pain in calf, thigh, or hip brought on by ambulation:    Pain in feet at night that wakes you up from your sleep:     Blood clot in your veins:    Leg swelling:         Pulmonary    Oxygen at home:    Productive cough:     Wheezing:         Neurologic    Sudden weakness in arms or legs:     Sudden numbness in arms or legs:     Sudden onset of difficulty speaking or slurred speech:    Temporary loss of vision in one eye:     Problems with dizziness:         Gastrointestinal    Blood in stool:     Vomited blood:         Genitourinary    Burning when urinating:     Blood in urine:        Psychiatric    Major depression:         Hematologic    Bleeding problems:    Problems with blood clotting too easily:        Skin    Rashes or ulcers:        Constitutional    Fever or chills:      PHYSICAL EXAM: Vitals:   05/13/22 1113 05/13/22 1116  BP: (!) 87/61 92/61  Pulse: 78 78  Resp: 16   Temp: 97.8 F (36.6 C)   TempSrc: Temporal   SpO2: 95%   Weight: 163 lb (73.9 kg)   Height: 5\' 3"  (1.6 m)     GENERAL: The patient is a well-nourished female, in no acute distress. The vital signs are documented above. CARDIAC: There is a regular rate and rhythm.  VASCULAR:  Palpable radial pulses bilaterally PULMONARY: No respiratory distress. ABDOMEN: Soft and non-tender. MUSCULOSKELETAL: There are no major deformities or cyanosis.  NEUROLOGIC: No focal weakness or  paresthesias are detected.  CN II-XII grossly intact. SKIN: There are no ulcers or rashes noted. PSYCHIATRIC: The patient has a normal affect.  DATA:   Carotid Arterial Duplex Study  Patient Name:  [facility]  Date of Exam:   05/07/2022 Medical Rec #: [id number]          Accession #:    [id number] Date of Birth: April 02, 1955         Patient Gender: F Patient Age:   79 years Exam Location:  [facility] Procedure:      VAS US CAROTID Referring Phys: Elta Guadeloupe PERINI   --------------------------------------------------------------------------- -----   Performing Technologist: Elta Guadeloupe RVT, RDMS    Examination Guidelines: A complete evaluation includes B-mode imaging, spectral Doppler, color Doppler, and power Doppler as needed of all accessible portions of each vessel. Bilateral testing is considered an integral part of a complete examination. Limited examinations for reoccurring indications may be performed as noted.    Right Carotid Findings: +----------+--------+--------+--------+---------------------+-------------- ----+           PSV cm/sEDV cm/sStenosisPlaque Description   Comments            +----------+--------+--------+--------+---------------------+-------------- ----+ CCA Prox  166     17                                                       +----------+--------+--------+--------+---------------------+-------------- ----+ CCA Mid   154     19      <50%    focal and calcific                       +----------+--------+--------+--------+---------------------+-------------- ----+ CCA Distal96      12      <50%    heterogenous         High resistant                                                            flow                +----------+--------+--------+--------+---------------------+-------------- ----+ ICA Prox  118     22      1-39%   heterogenous and     Shadowing                                             calcific                                 +----------+--------+--------+--------+---------------------+-------------- ----+ ICA Mid   109     22                                                       +----------+--------+--------+--------+---------------------+-------------- ----+ ICA Distal108     20                                                       +----------+--------+--------+--------+---------------------+-------------- ----+  ECA       149             <50%    heterogenous                             +----------+--------+--------+--------+---------------------+-------------- ----+  +----------+--------+-------+------------------------------+--------------- ----+           PSV cm/sEDV cmsDescribe                      Arm Pressure (mmHG) +----------+--------+-------+------------------------------+--------------- ----+ [id number]             Abnormal bi-directional and   83                                           blunted waveform suggestive of                                             proximal stenosis                                 +----------+--------+-------+------------------------------+--------------- ----+  +---------+--------+--+--------+--+----------+ VertebralPSV cm/s64EDV cm/s13Retrograde +---------+--------+--+--------+--+----------+  Abnormal RETROgrade vertebral artery waveform suggestive of proximal obstruction consistent with subclavian steal.  Left Carotid Findings: +----------+--------+-------+---------------+------------------+----------- ----+           PSV cm/sEDV    Stenosis       Plaque DescriptionComments                          cm/s                                                    +----------+--------+-------+---------------+------------------+----------- ----+ CCA Prox  251     27     Likely greater                   Elevated                                  than 50%                         proximal CCA                                                              velocity                                                                  suggestive of  stenosis but                                                              unable to                                                                  obtain more                                                               proximal                                                                  velocity to                                                               determine PSV                                                             ratio according                                                           to our                                                                    criteria.        +----------+--------+-------+---------------+------------------+----------- ----+ CCA Mid   181     24     <50%           heterogenous                      +----------+--------+-------+---------------+------------------+----------- ----+ CCA Distal153     27     <50%           heterogenous                      +----------+--------+-------+---------------+------------------+----------- ----+  ICA Prox  143     17                    smooth and                                                                calcific                          +----------+--------+-------+---------------+------------------+----------- ----+ ICA Mid   310     69     60-79%         irregular and                                                             hypoechoic                        +----------+--------+-------+---------------+------------------+----------- ----+ ICA Distal147     41                                       High resistant                                                            flow            +----------+--------+-------+---------------+------------------+----------- ----+ ECA       185            <50%                                             +----------+--------+-------+---------------+------------------+----------- ----+  +----------+--------+--------+--------+-------------------+           PSV cm/sEDV cm/sDescribeArm Pressure (mmHG) +----------+--------+--------+--------+-------------------+ [id number]     23      Stenotic79                  +----------+--------+--------+--------+-------------------+  +---------+--------+--+--------+--+----------------------------+ VertebralPSV cm/s34EDV cm/s14Bi- directional and Atypical +---------+--------+--+--------+--+----------------------------+  Abnormal Bidirectional vertebral artery waveform suggestive of incomplete subclavian steal.     Summary: Right Carotid: Velocities in the right ICA are consistent with a 1-39% stenosis.                  Non-hemodynamically significant plaque <50% noted in the CCA.                  The ECA appears <50% stenosed.  Left Carotid: Velocities in the left ICA are consistent with a 60-79% stenosis.                 Non-hemodynamically significant plaque <50% noted in the CCA.  The ECA appears <50% stenosed.                 Elevated proximal LEFT CCA velocity suggestive of stenosis but               unable to obtain more proximal velocity to determine PSV ratio               according to our criteria.  Vertebrals:  Abnormal RETROgrade RIGHT vertebral artery waveform suggestive of              proximal obstruction consistent with subclavian STEAL. Abnormal              Bidirectional LEFT vertebral artery waveform suggestive of              incomplete subclavian STEAL. Subclavians: Left subclavian artery was  stenotic. Right Subclavian artery is              blunted and turbulent suggestive of more proximal stenosis.  *See table(s) above for measurements and observations.      Assessment/Plan:  67 y.o. female,  with history of COPD, lung cancer, and tobacco abuse that presents for evaluation of carotid artery disease.  This has been followed by her PCP, Dr. Joylene Draft.  Discussed that her most recent carotid ultrasound shows evidence of some common carotid disease but more importantly minimal 1 to 39% stenosis in the right ICA and 60 to 79% left ICA stenosis.  In the absence of strokes or TIAs, discussed her asymptomatic carotid disease can be safely observed for now.  Discussed guidelines for surgical intervention are for greater than 80% ICA stenosis.  I will plan to see her in 6 months and we should probably do more frequent surveillance.  She does have retrograde flow in the right vertebral artery with bidirectional flow in the left vertebral artery but this has been consistent with ultrasounds back to 2020 with no worsening posterior circulation symptoms and I think we can continue observing this for now.   Marty Heck, MD [facility] Office: [phone]

## 2022-05-14 ENCOUNTER — Other Ambulatory Visit: Payer: Self-pay

## 2022-05-14 DIAGNOSIS — I6523 Occlusion and stenosis of bilateral carotid arteries: Secondary | ICD-10-CM

## 2022-06-12 ENCOUNTER — Telehealth: Payer: Self-pay | Admitting: Internal Medicine

## 2022-06-12 NOTE — Telephone Encounter (Signed)
Called patient regarding April appointments, patient is notified. 

## 2022-06-13 ENCOUNTER — Other Ambulatory Visit: Payer: Self-pay | Admitting: Internal Medicine

## 2022-06-13 ENCOUNTER — Telehealth: Payer: Self-pay | Admitting: Medical Oncology

## 2022-06-13 MED ORDER — HYDROCODONE-ACETAMINOPHEN 7.5-325 MG PO TABS: 1.0000 | ORAL_TABLET | Freq: Three times a day (TID) | ORAL | 0 refills | Status: DC | PRN

## 2022-06-13 NOTE — Telephone Encounter (Signed)
Requests refill for Hydrocodone for Pain -" My pain level has been a 10/10 for 4 days. The pain is in my R breast and under the armpit and around to my shoulder blade. Pain started a week ago."   Has one pill left. ( Dispensed in [place])

## 2022-06-13 NOTE — Telephone Encounter (Signed)
Spouse informed that a Hydrocodone refill was escribed and to keep appts for CT and f/u with [facility].

## 2022-06-23 ENCOUNTER — Other Ambulatory Visit: Payer: Self-pay | Admitting: Internal Medicine

## 2022-06-23 ENCOUNTER — Other Ambulatory Visit: Payer: Self-pay | Admitting: Medical Oncology

## 2022-06-23 ENCOUNTER — Telehealth: Payer: Self-pay

## 2022-06-23 ENCOUNTER — Telehealth: Payer: Self-pay | Admitting: Medical Oncology

## 2022-06-23 DIAGNOSIS — C3490 Malignant neoplasm of unspecified part of unspecified bronchus or lung: Secondary | ICD-10-CM

## 2022-06-23 MED ORDER — HYDROCODONE-ACETAMINOPHEN 7.5-325 MG PO TABS: 1.0000 | ORAL_TABLET | Freq: Four times a day (QID) | ORAL | 0 refills | Status: DC | PRN

## 2022-06-23 NOTE — Telephone Encounter (Signed)
Reqeusts refill for hydrocodone .

## 2022-06-23 NOTE — Telephone Encounter (Signed)
ILVM at [facility] - Does she need a PA to get the rest of her rx from 03/15 filled. The husband said she only got 10 tablets. She is taking an old liquid pain med now. ( ? Hycet). He said she  had hydrocodone rx in 11/2021 and the hycet several years ago.She is not opiate naive.  Inbasket sent to [facility]

## 2022-06-23 NOTE — Telephone Encounter (Signed)
Notified Patient of prior authorization approval for Hydrocodone 7.5/325 mg tablets. Medication is approved through 03/31/2023. No other needs or concerns voiced at this time.

## 2022-06-23 NOTE — Progress Notes (Signed)
Husband stated her pain breaksthrough at 4-5 hours -waiting till 8 hours is too far. She needs pain refill with increased frequency to q 4-5 hours .

## 2022-06-26 ENCOUNTER — Telehealth: Payer: Self-pay | Admitting: Medical Oncology

## 2022-06-26 NOTE — Telephone Encounter (Signed)
Palliative care explained to pt. and reason for the referral.

## 2022-06-27 ENCOUNTER — Inpatient Hospital Stay: Payer: Medicare Other | Attending: Physician Assistant

## 2022-06-27 ENCOUNTER — Other Ambulatory Visit: Payer: Self-pay

## 2022-06-27 ENCOUNTER — Encounter (HOSPITAL_COMMUNITY): Payer: Self-pay

## 2022-06-27 ENCOUNTER — Ambulatory Visit (HOSPITAL_COMMUNITY)
Admission: RE | Admit: 2022-06-27 | Discharge: 2022-06-27 | Disposition: A | Discharge status: Home / Self Care | Payer: Medicare Other | Source: Ambulatory Visit | Attending: Internal Medicine | Admitting: Internal Medicine

## 2022-06-27 DIAGNOSIS — I700 Atherosclerosis of aorta: Secondary | ICD-10-CM | POA: Diagnosis not present

## 2022-06-27 DIAGNOSIS — C3490 Malignant neoplasm of unspecified part of unspecified bronchus or lung: Secondary | ICD-10-CM

## 2022-06-27 DIAGNOSIS — C3411 Malignant neoplasm of upper lobe, right bronchus or lung: Secondary | ICD-10-CM | POA: Insufficient documentation

## 2022-06-27 DIAGNOSIS — Z9221 Personal history of antineoplastic chemotherapy: Secondary | ICD-10-CM | POA: Insufficient documentation

## 2022-06-27 DIAGNOSIS — Z79899 Other long term (current) drug therapy: Secondary | ICD-10-CM | POA: Insufficient documentation

## 2022-06-27 DIAGNOSIS — S2231XA Fracture of one rib, right side, initial encounter for closed fracture: Secondary | ICD-10-CM | POA: Diagnosis not present

## 2022-06-27 DIAGNOSIS — I2510 Atherosclerotic heart disease of native coronary artery without angina pectoris: Secondary | ICD-10-CM | POA: Diagnosis not present

## 2022-06-27 DIAGNOSIS — J439 Emphysema, unspecified: Secondary | ICD-10-CM | POA: Diagnosis not present

## 2022-06-27 LAB — CBC WITH DIFFERENTIAL (CANCER CENTER ONLY)
Abs Immature Granulocytes: 0.01 10*3/uL (ref 0.00–0.07)
Basophils Absolute: 0.1 10*3/uL (ref 0.0–0.1)
Basophils Relative: 1 %
Eosinophils Absolute: 0.2 10*3/uL (ref 0.0–0.5)
Eosinophils Relative: 3 %
HCT: 34.7 % — ABNORMAL LOW (ref 36.0–46.0)
Hemoglobin: 11.4 g/dL — ABNORMAL LOW (ref 12.0–15.0)
Immature Granulocytes: 0 %
Lymphocytes Relative: 16 %
Lymphs Abs: 0.9 10*3/uL (ref 0.7–4.0)
MCH: 31.1 pg (ref 26.0–34.0)
MCHC: 32.9 g/dL (ref 30.0–36.0)
MCV: 94.6 fL (ref 80.0–100.0)
Monocytes Absolute: 0.7 10*3/uL (ref 0.1–1.0)
Monocytes Relative: 11 %
Neutro Abs: 4.1 10*3/uL (ref 1.7–7.7)
Neutrophils Relative %: 69 %
Platelet Count: 284 10*3/uL (ref 150–400)
RBC: 3.67 MIL/uL — ABNORMAL LOW (ref 3.87–5.11)
RDW: 14.6 % (ref 11.5–15.5)
WBC Count: 5.9 10*3/uL (ref 4.0–10.5)
nRBC: 0 % (ref 0.0–0.2)

## 2022-06-27 LAB — CMP (CANCER CENTER ONLY)
ALT: 8 U/L (ref 0–44)
AST: 10 U/L — ABNORMAL LOW (ref 15–41)
Albumin: 3.6 g/dL (ref 3.5–5.0)
Alkaline Phosphatase: 70 U/L (ref 38–126)
Anion gap: 4 — ABNORMAL LOW (ref 5–15)
BUN: 20 mg/dL (ref 8–23)
CO2: 27 mmol/L (ref 22–32)
Calcium: 9.1 mg/dL (ref 8.9–10.3)
Chloride: 106 mmol/L (ref 98–111)
Creatinine: 1.48 mg/dL — ABNORMAL HIGH (ref 0.44–1.00)
GFR, Estimated: 38 mL/min — ABNORMAL LOW (ref >60)
Glucose, Bld: 84 mg/dL (ref 70–99)
Potassium: 4 mmol/L (ref 3.5–5.1)
Sodium: 137 mmol/L (ref 135–145)
Total Bilirubin: 0.4 mg/dL (ref 0.3–1.2)
Total Protein: 7.4 g/dL (ref 6.5–8.1)

## 2022-06-27 MED ORDER — SODIUM CHLORIDE (PF) 0.9 % IJ SOLN
INTRAMUSCULAR | Status: AC
  Filled 2022-06-27: qty 50

## 2022-06-27 MED ORDER — IOHEXOL 300 MG/ML  SOLN
75.0000 mL | Freq: Once | INTRAMUSCULAR | Status: AC | PRN
  Administered 2022-06-27: 75 mL via INTRAVENOUS

## 2022-07-01 ENCOUNTER — Inpatient Hospital Stay (HOSPITAL_BASED_OUTPATIENT_CLINIC_OR_DEPARTMENT_OTHER): Payer: Medicare Other | Admitting: Nurse Practitioner

## 2022-07-01 ENCOUNTER — Encounter: Payer: Self-pay | Admitting: Nurse Practitioner

## 2022-07-01 ENCOUNTER — Inpatient Hospital Stay: Payer: Medicare Other | Admitting: Nurse Practitioner

## 2022-07-01 ENCOUNTER — Other Ambulatory Visit: Payer: Self-pay

## 2022-07-01 ENCOUNTER — Inpatient Hospital Stay: Payer: Medicare Other | Attending: Physician Assistant | Admitting: Internal Medicine

## 2022-07-01 ENCOUNTER — Encounter: Payer: Self-pay | Admitting: Internal Medicine

## 2022-07-01 ENCOUNTER — Ambulatory Visit: Payer: Medicare Other | Admitting: Internal Medicine

## 2022-07-01 VITALS — BP 93/61 | HR 89 | Temp 97.8°F | Resp 17 | Ht 63.0 in | Wt 168.0 lb

## 2022-07-01 DIAGNOSIS — C3411 Malignant neoplasm of upper lobe, right bronchus or lung: Secondary | ICD-10-CM | POA: Insufficient documentation

## 2022-07-01 DIAGNOSIS — R5383 Other fatigue: Secondary | ICD-10-CM | POA: Insufficient documentation

## 2022-07-01 DIAGNOSIS — Z9221 Personal history of antineoplastic chemotherapy: Secondary | ICD-10-CM | POA: Diagnosis not present

## 2022-07-01 DIAGNOSIS — C3490 Malignant neoplasm of unspecified part of unspecified bronchus or lung: Secondary | ICD-10-CM | POA: Diagnosis not present

## 2022-07-01 DIAGNOSIS — R059 Cough, unspecified: Secondary | ICD-10-CM | POA: Insufficient documentation

## 2022-07-01 DIAGNOSIS — Z888 Allergy status to other drugs, medicaments and biological substances status: Secondary | ICD-10-CM | POA: Insufficient documentation

## 2022-07-01 DIAGNOSIS — Z923 Personal history of irradiation: Secondary | ICD-10-CM | POA: Diagnosis not present

## 2022-07-01 DIAGNOSIS — I700 Atherosclerosis of aorta: Secondary | ICD-10-CM | POA: Diagnosis not present

## 2022-07-01 DIAGNOSIS — K5903 Drug induced constipation: Secondary | ICD-10-CM

## 2022-07-01 DIAGNOSIS — R0609 Other forms of dyspnea: Secondary | ICD-10-CM | POA: Diagnosis not present

## 2022-07-01 DIAGNOSIS — Z7189 Other specified counseling: Secondary | ICD-10-CM

## 2022-07-01 DIAGNOSIS — F17210 Nicotine dependence, cigarettes, uncomplicated: Secondary | ICD-10-CM | POA: Insufficient documentation

## 2022-07-01 DIAGNOSIS — I2510 Atherosclerotic heart disease of native coronary artery without angina pectoris: Secondary | ICD-10-CM | POA: Diagnosis not present

## 2022-07-01 DIAGNOSIS — G893 Neoplasm related pain (acute) (chronic): Secondary | ICD-10-CM

## 2022-07-01 DIAGNOSIS — Z5111 Encounter for antineoplastic chemotherapy: Secondary | ICD-10-CM

## 2022-07-01 DIAGNOSIS — Z515 Encounter for palliative care: Secondary | ICD-10-CM | POA: Diagnosis not present

## 2022-07-01 DIAGNOSIS — Z79899 Other long term (current) drug therapy: Secondary | ICD-10-CM | POA: Insufficient documentation

## 2022-07-01 DIAGNOSIS — R530 Neoplastic (malignant) related fatigue: Secondary | ICD-10-CM

## 2022-07-01 MED ORDER — OXYCODONE HCL 5 MG PO TABS
5.0000 mg | ORAL_TABLET | Freq: Once | ORAL | Status: AC
  Administered 2022-07-01: 5 mg via ORAL
  Filled 2022-07-01: qty 1

## 2022-07-01 MED ORDER — MELOXICAM 7.5 MG PO TABS: 7.5000 mg | ORAL_TABLET | Freq: Every day | ORAL | 0 refills | Status: DC

## 2022-07-01 MED ORDER — BACLOFEN 10 MG PO TABS: 10.0000 mg | ORAL_TABLET | Freq: Two times a day (BID) | ORAL | 0 refills | Status: DC

## 2022-07-01 MED ORDER — HYDROCODONE-ACETAMINOPHEN 10-325 MG PO TABS: 1.0000 | ORAL_TABLET | Freq: Four times a day (QID) | ORAL | 0 refills | Status: DC | PRN

## 2022-07-01 MED ORDER — KETOROLAC TROMETHAMINE 30 MG/ML IJ SOLN
30.0000 mg | Freq: Once | INTRAMUSCULAR | Status: AC
  Administered 2022-07-01: 30 mg via INTRAMUSCULAR
  Filled 2022-07-01: qty 1

## 2022-07-01 NOTE — Patient Instructions (Signed)
Steps to Quit Smoking Smoking tobacco is the leading cause of preventable death. It can affect almost every organ in the body. Smoking puts you and people around you at risk for many serious, long-lasting (chronic) diseases. Quitting smoking can be hard, but it is one of the best things that you can do for your health. It is never too late to quit. Do not give up if you cannot quit the first time. Some people need to try many times to quit. Do your best to stick to your quit plan, and talk with your doctor if you have any questions or concerns. How do I get ready to quit? Pick a date to quit. Set a date within the next 2 weeks to give you time to prepare. Write down the reasons why you are quitting. Keep this list in places where you will see it often. Tell your family, friends, and co-workers that you are quitting. Their support is important. Talk with your doctor about the choices that may help you quit. Find out if your health insurance will pay for these treatments. Know the people, places, things, and activities that make you want to smoke (triggers). Avoid them. What first steps can I take to quit smoking? Throw away all cigarettes at home, at work, and in your car. Throw away the things that you use when you smoke, such as ashtrays and lighters. Clean your car. Empty the ashtray. Clean your home, including curtains and carpets. What can I do to help me quit smoking? Talk with your doctor about taking medicines and seeing a counselor. You are more likely to succeed when you do both. If you are pregnant or breastfeeding: Talk with your doctor about counseling or other ways to quit smoking. Do not take medicine to help you quit smoking unless your doctor tells you to. Quit right away Quit smoking completely, instead of slowly cutting back on how much you smoke over a period of time. Stopping smoking right away may be more successful than slowly quitting. Go to counseling. In-person is best  if this is an option. You are more likely to quit if you go to counseling sessions regularly. Take medicine You may take medicines to help you quit. Some medicines need a prescription, and some you can buy over-the-counter. Some medicines may contain a drug called nicotine to replace the nicotine in cigarettes. Medicines may: Help you stop having the desire to smoke (cravings). Help to stop the problems that come when you stop smoking (withdrawal symptoms). Your doctor may ask you to use: Nicotine patches, gum, or lozenges. Nicotine inhalers or sprays. Non-nicotine medicine that you take by mouth. Find resources Find resources and other ways to help you quit smoking and remain smoke-free after you quit. They include: Online chats with a counselor. Phone quitlines. Printed self-help materials. Support groups or group counseling. Text messaging programs. Mobile phone apps. Use apps on your mobile phone or tablet that can help you stick to your quit plan. Examples of free services include Quit Guide from the CDC and smokefree.gov  What can I do to make it easier to quit?  Talk to your family and friends. Ask them to support and encourage you. Call a phone quitline, such as 1-800-QUIT-NOW, reach out to support groups, or work with a counselor. Ask people who smoke to not smoke around you. Avoid places that make you want to smoke, such as: Bars. Parties. Smoke-break areas at work. Spend time with people who do not smoke. Lower   the stress in your life. Stress can make you want to smoke. Try these things to lower stress: Getting regular exercise. Doing deep-breathing exercises. Doing yoga. Meditating. What benefits will I see if I quit smoking? Over time, you may have: A better sense of smell and taste. Less coughing and sore throat. A slower heart rate. Lower blood pressure. Clearer skin. Better breathing. Fewer sick days. Summary Quitting smoking can be hard, but it is one of  the best things that you can do for your health. Do not give up if you cannot quit the first time. Some people need to try many times to quit. When you decide to quit smoking, make a plan to help you succeed. Quit smoking right away, not slowly over a period of time. When you start quitting, get help and support to keep you smoke-free. This information is not intended to replace advice given to you by your health care provider. Make sure you discuss any questions you have with your health care provider. Document Revised: 03/08/2021 Document Reviewed: 03/08/2021 Elsevier Patient Education  2023 [employer]  

## 2022-07-01 NOTE — Progress Notes (Signed)
[facility]     [place] [phone]   Fax:([phone] [place], [place] [address]  DIAGNOSIS: Stage IIIA (T1b, N2, M0) non-small cell lung cancer presented with right upper lobe lung nodule in addition to mediastinal lymphadenopathy diagnosed in January 2021.  PRIOR THERAPY:   1) Concurrent chemoradiation with weekly carboplatin for AUC of 2 and paclitaxel 45 NG/M2.  Status post 5 cycles. Last dose received on 05/30/19. 2) Consolidation immunotherapy with Imfinzi 1500 mg/kg IV every 4 weeks.  First dose expected on 07/21/2019. Status post 13 cycles. 3) SBRT to the large right upper lobe pulmonary nodules under the care of Dr. Lisbeth Renshaw completed October 18, 2021.   CURRENT THERAPY: Observation.  INTERVAL HISTORY: Betty Anderson 67 y.o. female returns to the clinic today for follow-up visit accompanied by her husband.  The patient is feeling fine today with no concerning complaints except for the baseline shortness of breath increased with exertion.  She continues to smoke regularly with no intention to quit.  She denied having any chest pain but has mild cough with no hemoptysis.  She has no nausea, vomiting, diarrhea or constipation.  She has no headache or visual changes.  She is here today for evaluation with repeat blood work.  MEDICAL HISTORY: Past Medical History:  Diagnosis Date   COPD (chronic obstructive pulmonary disease) ([facility])    emphysema   Hyperlipidemia    Hypertension    lung ca dx'd 03/2019   Lung nodule    Paroxysmal atrial flutter ([facility])    per cardiology note   Peripheral artery disease ([facility])    Tobacco abuse    Vitamin D deficiency    Wears glasses     ALLERGIES:  is allergic to lisinopril.  MEDICATIONS:  Current Outpatient Medications  Medication Sig Dispense Refill   aspirin EC 81 MG tablet Take 1 tablet (81 mg total) by mouth daily. 90 tablet 3   azithromycin (ZITHROMAX) 250 MG  tablet Use as instructed. 6 each 0   Cholecalciferol (DIALYVITE VITAMIN D 5000) 125 MCG (5000 UT) capsule Take 5,000 Units by mouth daily.     escitalopram (LEXAPRO) 10 MG tablet Take 10 mg by mouth daily.     guaiFENesin (MUCINEX) 600 MG 12 hr tablet Take by mouth 2 (two) times daily.     HYDROcodone-acetaminophen (NORCO) 7.5-325 MG tablet Take 1 tablet by mouth every 6 (six) hours as needed for moderate pain. 30 tablet 0   pantoprazole (PROTONIX) 40 MG tablet TAKE ONE TABLET BY MOUTH TWICE A DAY 180 tablet 2   rosuvastatin (CRESTOR) 20 MG tablet Take 20 mg by mouth every evening.      Tiotropium Bromide-Olodaterol (STIOLTO RESPIMAT) 2.5-2.5 MCG/ACT AERS Inhale 2 puffs into the lungs daily. 4 g 0   vitamin B-12 (CYANOCOBALAMIN) 1000 MCG tablet Take 1,000 mcg by mouth daily.     No current facility-administered medications for this visit.    SURGICAL HISTORY:  Past Surgical History:  Procedure Laterality Date   BRONCHIAL NEEDLE ASPIRATION BIOPSY  04/12/2019   Procedure: BRONCHIAL NEEDLE ASPIRATION BIOPSIES;  Surgeon: Garner Nash, DO;  Location: [facility];  Service: Cardiopulmonary;;   ENDOBRONCHIAL ULTRASOUND N/A 04/12/2019   Procedure: ENDOBRONCHIAL ULTRASOUND;  Surgeon: Garner Nash, DO;  Location: [facility];  Service: Cardiopulmonary;  Laterality: N/A;   OVARIAN CYST REMOVAL     TONSILLECTOMY     TUBAL LIGATION     VIDEO BRONCHOSCOPY N/A  04/12/2019   Procedure: VIDEO BRONCHOSCOPY WITHOUT FLUORO;  Surgeon: Garner Nash, DO;  Location: [facility];  Service: Cardiopulmonary;  Laterality: N/A;    REVIEW OF SYSTEMS:  Constitutional: positive for fatigue Eyes: negative Ears, nose, mouth, throat, and face: negative Respiratory: positive for cough and dyspnea on exertion Cardiovascular: negative Gastrointestinal: negative Genitourinary:negative Integument/breast: negative Hematologic/lymphatic: negative Musculoskeletal:negative Neurological:  negative Behavioral/Psych: negative Endocrine: negative Allergic/Immunologic: negative   PHYSICAL EXAMINATION: General appearance: alert, cooperative, fatigued, and no distress Head: Normocephalic, without obvious abnormality, atraumatic Neck: no adenopathy, no JVD, supple, symmetrical, trachea midline, and thyroid not enlarged, symmetric, no tenderness/mass/nodules Lymph nodes: Cervical, supraclavicular, and axillary nodes normal. Resp: clear to auscultation bilaterally Back: symmetric, no curvature. ROM normal. No CVA tenderness. Cardio: regular rate and rhythm, S1, S2 normal, no murmur, click, rub or gallop GI: soft, non-tender; bowel sounds normal; no masses,  no organomegaly Extremities: extremities normal, atraumatic, no cyanosis or edema Neurologic: Alert and oriented X 3, normal strength and tone. Normal symmetric reflexes. Normal coordination and gait  ECOG PERFORMANCE STATUS: 1 - Symptomatic but completely ambulatory  Blood pressure 93/61, pulse 89, temperature 97.8 F (36.6 C), temperature source Temporal, resp. rate 17, height 5\' 3"  (1.6 m), weight 168 lb (76.2 kg), SpO2 93 %.  LABORATORY DATA: Lab Results  Component Value Date   WBC 5.9 06/27/2022   HGB 11.4 (L) 06/27/2022   HCT 34.7 (L) 06/27/2022   MCV 94.6 06/27/2022   PLT 284 06/27/2022      Chemistry      Component Value Date/Time   NA 137 06/27/2022 1104   K 4.0 06/27/2022 1104   CL 106 06/27/2022 1104   CO2 27 06/27/2022 1104   BUN 20 06/27/2022 1104   CREATININE 1.48 (H) 06/27/2022 1104      Component Value Date/Time   CALCIUM 9.1 06/27/2022 1104   ALKPHOS 70 06/27/2022 1104   AST 10 (L) 06/27/2022 1104   ALT 8 06/27/2022 1104   BILITOT 0.4 06/27/2022 1104       RADIOGRAPHIC STUDIES: CT Chest W Contrast  Result Date: 06/30/2022 CLINICAL DATA:  Right lung cancer, diagnosed 2020. Left lung cancer, diagnosed 2022, status post chemotherapy and XRT, immunotherapy complete. EXAM: CT CHEST WITH  CONTRAST TECHNIQUE: Multidetector CT imaging of the chest was performed during intravenous contrast administration. RADIATION DOSE REDUCTION: This exam was performed according to the departmental dose-optimization program which includes automated exposure control, adjustment of the mA and/or kV according to patient size and/or use of iterative reconstruction technique. CONTRAST:  35mL OMNIPAQUE IOHEXOL 300 MG/ML  SOLN COMPARISON:  03/25/2022 FINDINGS: Cardiovascular: The heart is normal in size. No pericardial effusion. No evidence of thoracic aneurysm. Atherosclerotic calcifications of the aortic arch. Moderate three-coronary atherosclerosis. Mediastinum/Nodes: 12 mm short axis subcarinal node (series 2/image 58), favored to be mildly progressive, indeterminate. Visualized thyroid is unremarkable. Lungs/Pleura: Radiation changes in the right upper lobe. Progressive soft tissue along the medial aspect of the radiation changes (series 2/image 33), worrisome for possible tumor recurrence, although indeterminate. 11 x 9 mm subpleural nodule along the posterior right upper lobe (series 7/image 28), corresponding to the site of suspected recurrent tumor on prior PET, grossly unchanged. Faint centrilobular ground-glass nodularity in the lungs bilaterally. Superimposed subpleural reticulation/fibrosis, lower lobe predominant, favoring chronic interstitial lung disease. Mild centrilobular and paraseptal emphysematous changes, upper lung predominant. No pleural effusion or pneumothorax. Upper Abdomen: Visualized upper abdomen is grossly unremarkable, noting vascular calcifications. Musculoskeletal: Degenerative changes of the visualized thoracolumbar spine. Nondisplaced fracture  of the right lateral 2nd rib (series 2/image 26), new. Lytic lesion involving the right anterior 1st rib (series 2/image 21), new, raising the possibility isolated lytic metastasis, although equivocal. IMPRESSION: Radiation changes in the right upper  lobe. Progressive soft tissue along the medial aspect of the radiation changes, worrisome for possible tumor recurrence, although indeterminate. Additional subpleural nodule in the posterior right lung apex, worrisome for recurrence on prior PET, is grossly unchanged. Consider follow-up PET-CT for further evaluation. 12 mm short axis subcarinal node, favored to be mildly progressive, suspicious for nodal metastasis. Nondisplaced fracture of the right lateral 2nd rib, new. This may be pathologic related to prior radiation changes. However, there is also a lytic lesion in the right anterior 1st rib which could reflect isolated lytic metastasis. Attention on PET is suggested. Aortic Atherosclerosis (ICD10-I70.0) and Emphysema (ICD10-J43.9). Electronically Signed   By: Julian Hy M.D.   On: 06/30/2022 02:51     ASSESSMENT AND PLAN: This is a very pleasant 67 years old white female with stage IIIA non-small cell lung cancer, presented with right upper lobe lung nodule and mediastinal lymphadenopathy diagnosed in January 2021.   She completed a course of concurrent chemoradiation with weekly carboplatin and paclitaxel for 5 cycles and tolerated her treatment well.  She has partial response. The patient underwent consolidation treatment with Imfinzi 1500 mg IV every 4 weeks status post 13 cycles.  She tolerated this treatment well with no concerning adverse effects. She also underwent SBRT to enlarging right upper lobe pulmonary nodules under the care of Dr. Lisbeth Renshaw. Her last CT scan of the chest on Aug 22, 2021 showed no concerning findings for disease progression except for enlargement of the nodular area along the pleural surface in the right upper lobe concerning for disease recurrence this was followed by a PET scan on 09/05/2021 and that showed increased radiotracer uptake in the nodular density within the right upper lobe concerning for new site of metabolically active tumor.  The patient underwent  curative radiotherapy to this lesion with SBRT under the care of Dr. Lisbeth Renshaw completed on October 18, 2021. The patient is currently on observation and feeling fine except for the mild shortness of breath and cough. She had repeat CT scan of the chest performed recently.  I personally and independently reviewed the scan images and discussed the result with the patient and her husband. Her scan showed radiation changes in the right upper lobe with progressive soft tissue along the medial aspect of the radiation changes worrisome for possible tumor recurrence but still indeterminate.  There was additional subpleural nodule in the posterior right lung apex worrisome for disease recurrence and unchanged from the previous PET scan the scan also showed 1.2 cm short axis subcarinal lymph node favored to be mildly progressive and suspicious for nodal metastasis in addition to nondisplaced fracture of the right lateral second rib that is new and may be pathologic with a lytic lesion in the right anterior first rib that could also reflect isolated lytic metastasis. I discussed with the patient her options including repeating a PET scan soon versus close monitoring and have another CT scan of the chest in 2 months.  The patient would like to wait a little bit longer and have a CT scan of the chest in 2 months.  If there is any further concerning finding for disease progression, we will consider repeating a PET scan and probably biopsy if needed. For pain management, she is followed by the palliative care team.  The patient will come back for follow-up visit in 2 months for evaluation with repeat CT scan of the chest. For the smoking cessation I discussed again with the patient but she is not willing to quit smoking. She was advised to call immediately if she has any other concerning symptoms in the interval. The patient voices understanding of current disease status and treatment options and is in agreement with the current  care plan.  All questions were answered. The patient knows to call the clinic with any problems, questions or concerns. We can certainly see the patient much sooner if necessary.  The total time spent in the appointment was 30 minutes.  Disclaimer: This note was dictated with voice recognition software. Similar sounding words can inadvertently be transcribed and may not be corrected upon review.

## 2022-07-01 NOTE — Progress Notes (Deleted)
[place]  [phone]) [phone] [phone]) [phone]   Name: Betty Anderson Date: 07/01/2022 MRN: [id number]  DOB: 10-29-1955  Patient Care Team: [name], MD as PCP - General (Internal Medicine) Valrie Hart, RN as Oncology Nurse Navigator    REASON FOR CONSULTATION: Betty Anderson is a 67 y.o. female with oncologic medical history including non-small cell lung cancer (04/2019) currently on observation, as well as A-Flutter, CAD, COPD, and HTN.  Palliative ask to see for symptom and pain management and goals of care.    SOCIAL HISTORY:    Betty Anderson reports that she has been smoking cigarettes. She started smoking about 60 years ago. She has a 175.00 pack-year smoking history. She has never used smokeless tobacco. She reports current alcohol use of about 8.0 standard drinks of alcohol per week. She reports that she does not use drugs.  ADVANCE DIRECTIVES:  None on file  CODE STATUS:   PAST MEDICAL HISTORY: Past Medical History:  Diagnosis Date   COPD (chronic obstructive pulmonary disease)    emphysema   Hyperlipidemia    Hypertension    lung ca dx'd 03/2019   Lung nodule    Paroxysmal atrial flutter    per cardiology note   Peripheral artery disease    Tobacco abuse    Vitamin D deficiency    Wears glasses     PAST SURGICAL HISTORY:  Past Surgical History:  Procedure Laterality Date   BRONCHIAL NEEDLE ASPIRATION BIOPSY  04/12/2019   Procedure: BRONCHIAL NEEDLE ASPIRATION BIOPSIES;  Surgeon: Garner Nash, DO;  Location: [facility];  Service: Cardiopulmonary;;   ENDOBRONCHIAL ULTRASOUND N/A 04/12/2019   Procedure: ENDOBRONCHIAL ULTRASOUND;  Surgeon: Garner Nash, DO;  Location: [facility];  Service: Cardiopulmonary;  Laterality: N/A;   OVARIAN CYST REMOVAL     TONSILLECTOMY     TUBAL LIGATION     VIDEO BRONCHOSCOPY N/A 04/12/2019   Procedure: VIDEO BRONCHOSCOPY WITHOUT FLUORO;  Surgeon: Garner Nash, DO;  Location: [facility];  Service: Cardiopulmonary;  Laterality: N/A;    HEMATOLOGY/ONCOLOGY HISTORY:  Oncology History  Malignant neoplasm of upper lobe of right lung  04/12/2019 Initial Diagnosis   Malignant neoplasm of upper lobe of right lung ([place])   05/03/2019 - 06/13/2019 Chemotherapy   The patient had palonosetron (ALOXI) injection 0.25 mg, 0.25 mg, Intravenous,  Once, 6 of 7 cycles Administration: 0.25 mg (05/03/2019), 0.25 mg (05/30/2019), 0.25 mg (05/11/2019), 0.25 mg (05/16/2019), 0.25 mg (05/23/2019) CARBOplatin (PARAPLATIN) 180 mg in sodium chloride 0.9 % 100 mL chemo infusion, 180 mg (100 % of original dose 177.8 mg), Intravenous,  Once, 6 of 7 cycles Dose modification: 177.8 mg (original dose 177.8 mg, Cycle 1) Administration: 180 mg (05/03/2019), 180 mg (05/30/2019), 180 mg (05/11/2019), 160 mg (05/16/2019), 180 mg (05/23/2019) PACLitaxel (TAXOL) 84 mg in sodium chloride 0.9 % 250 mL chemo infusion (</= 80mg /m2), 45 mg/m2 = 84 mg, Intravenous,  Once, 6 of 7 cycles Administration: 84 mg (05/03/2019), 84 mg (05/30/2019), 84 mg (05/11/2019), 84 mg (05/16/2019), 84 mg (05/23/2019)  for chemotherapy treatment.    07/21/2019 - 06/21/2020 Chemotherapy   Patient is on Treatment Plan : LUNG NSCLC Durvalumab q28d       ALLERGIES:  is allergic to lisinopril.  MEDICATIONS:  Current Outpatient Medications  Medication Sig Dispense Refill   baclofen (LIORESAL) 10 MG tablet Take 1 tablet (10 mg total) by mouth 2 (two) times daily. 45 each 0   HYDROcodone-acetaminophen (NORCO) 10-325 MG  tablet Take 1 tablet by mouth every 6 (six) hours as needed. 45 tablet 0   meloxicam (MOBIC) 7.5 MG tablet Take 1 tablet (7.5 mg total) by mouth daily. 14 tablet 0   aspirin EC 81 MG tablet Take 1 tablet (81 mg total) by mouth daily. 90 tablet 3   Cholecalciferol (DIALYVITE VITAMIN D 5000) 125 MCG (5000 UT) capsule Take 5,000 Units by mouth daily.     escitalopram (LEXAPRO) 10 MG tablet Take 10 mg by mouth daily.      guaiFENesin (MUCINEX) 600 MG 12 hr tablet Take by mouth 2 (two) times daily.     pantoprazole (PROTONIX) 40 MG tablet TAKE ONE TABLET BY MOUTH TWICE A DAY 180 tablet 2   rosuvastatin (CRESTOR) 20 MG tablet Take 20 mg by mouth every evening.      Tiotropium Bromide-Olodaterol (STIOLTO RESPIMAT) 2.5-2.5 MCG/ACT AERS Inhale 2 puffs into the lungs daily. 4 g 0   vitamin B-12 (CYANOCOBALAMIN) 1000 MCG tablet Take 1,000 mcg by mouth daily.     No current facility-administered medications for this visit.    VITAL SIGNS: There were no vitals taken for this visit. There were no vitals filed for this visit.  Estimated body mass index is 29.76 kg/m as calculated from the following:   Height as of an earlier encounter on 07/01/22: 5\' 3"  (1.6 m).   Weight as of an earlier encounter on 07/01/22: 76.2 kg.  LABS: CBC:    Component Value Date/Time   WBC 5.9 06/27/2022 1104   WBC 8.1 04/12/2019 0615   HGB 11.4 (L) 06/27/2022 1104   HCT 34.7 (L) 06/27/2022 1104   PLT 284 06/27/2022 1104   MCV 94.6 06/27/2022 1104   NEUTROABS 4.1 06/27/2022 1104   LYMPHSABS 0.9 06/27/2022 1104   MONOABS 0.7 06/27/2022 1104   EOSABS 0.2 06/27/2022 1104   BASOSABS 0.1 06/27/2022 1104   Comprehensive Metabolic Panel:    Component Value Date/Time   NA 137 06/27/2022 1104   K 4.0 06/27/2022 1104   CL 106 06/27/2022 1104   CO2 27 06/27/2022 1104   BUN 20 06/27/2022 1104   CREATININE 1.48 (H) 06/27/2022 1104   GLUCOSE 84 06/27/2022 1104   CALCIUM 9.1 06/27/2022 1104   AST 10 (L) 06/27/2022 1104   ALT 8 06/27/2022 1104   ALKPHOS 70 06/27/2022 1104   BILITOT 0.4 06/27/2022 1104   PROT 7.4 06/27/2022 1104   ALBUMIN 3.6 06/27/2022 1104    RADIOGRAPHIC STUDIES: CT Chest W Contrast  Result Date: 06/30/2022 CLINICAL DATA:  Right lung cancer, diagnosed 2020. Left lung cancer, diagnosed 2022, status post chemotherapy and XRT, immunotherapy complete. EXAM: CT CHEST WITH CONTRAST TECHNIQUE: Multidetector CT imaging  of the chest was performed during intravenous contrast administration. RADIATION DOSE REDUCTION: This exam was performed according to the departmental dose-optimization program which includes automated exposure control, adjustment of the mA and/or kV according to patient size and/or use of iterative reconstruction technique. CONTRAST:  43mL OMNIPAQUE IOHEXOL 300 MG/ML  SOLN COMPARISON:  03/25/2022 FINDINGS: Cardiovascular: The heart is normal in size. No pericardial effusion. No evidence of thoracic aneurysm. Atherosclerotic calcifications of the aortic arch. Moderate three-coronary atherosclerosis. Mediastinum/Nodes: 12 mm short axis subcarinal node (series 2/image 58), favored to be mildly progressive, indeterminate. Visualized thyroid is unremarkable. Lungs/Pleura: Radiation changes in the right upper lobe. Progressive soft tissue along the medial aspect of the radiation changes (series 2/image 33), worrisome for possible tumor recurrence, although indeterminate. 11 x 9 mm subpleural nodule along the  posterior right upper lobe (series 7/image 28), corresponding to the site of suspected recurrent tumor on prior PET, grossly unchanged. Faint centrilobular ground-glass nodularity in the lungs bilaterally. Superimposed subpleural reticulation/fibrosis, lower lobe predominant, favoring chronic interstitial lung disease. Mild centrilobular and paraseptal emphysematous changes, upper lung predominant. No pleural effusion or pneumothorax. Upper Abdomen: Visualized upper abdomen is grossly unremarkable, noting vascular calcifications. Musculoskeletal: Degenerative changes of the visualized thoracolumbar spine. Nondisplaced fracture of the right lateral 2nd rib (series 2/image 26), new. Lytic lesion involving the right anterior 1st rib (series 2/image 21), new, raising the possibility isolated lytic metastasis, although equivocal. IMPRESSION: Radiation changes in the right upper lobe. Progressive soft tissue along the  medial aspect of the radiation changes, worrisome for possible tumor recurrence, although indeterminate. Additional subpleural nodule in the posterior right lung apex, worrisome for recurrence on prior PET, is grossly unchanged. Consider follow-up PET-CT for further evaluation. 12 mm short axis subcarinal node, favored to be mildly progressive, suspicious for nodal metastasis. Nondisplaced fracture of the right lateral 2nd rib, new. This may be pathologic related to prior radiation changes. However, there is also a lytic lesion in the right anterior 1st rib which could reflect isolated lytic metastasis. Attention on PET is suggested. Aortic Atherosclerosis (ICD10-I70.0) and Emphysema (ICD10-J43.9). Electronically Signed   By: Julian Hy M.D.   On: 06/30/2022 02:51    PERFORMANCE STATUS (ECOG) : 1 - Symptomatic but completely ambulatory  Review of Systems Unless otherwise noted, a complete review of systems is negative.  Physical Exam General: NAD Cardiovascular: regular rate and rhythm Pulmonary: respirations even and unlabored, right upper lung lobe has longer inspiratory sound than left upper lobe Abdomen: soft, nontender, + bowel sounds Extremities: no edema, right arm movement limited, right arm tender to touch Skin: no rashes Neurological: alert, oriented, pleasant  IMPRESSION: Betty Anderson presents to visit with her husband Jeneen Rinks of 24 years. They have a blended family with total 5 children, 10 grandchildren, and 5 great grand children. Patient is originally from [place] and some of her children remain there, others live as far away as [place]. There are no children living locally. Betty Anderson has three dogs: a great pyrenees, daschund, and a chihuahua.   I introduced myself, Nikki NP, [place] RN, and Palliative's role in collaboration with the oncology team. Concept of Palliative Care was introduced as specialized medical care for people and their families living with serious  illness.  It focuses on providing relief from the symptoms and stress of a serious illness.  The goal is to improve quality of life for both the patient and the family. Values and goals of care important to patient and family were attempted to be elicited.   We discussed Betty Anderson's current illness and what it means in the larger context of her on-going co-morbidities. Natural disease trajectory and expectations were discussed. We discussed the importance of continued conversation with family and their medical providers regarding overall plan of care and treatment options, ensuring decisions are within the context of the patients values and GOCs.   Pain:  Betty Anderson is most concerned about getting relief from her pain. Reports that pain is constant, ranging from 6-10 out of 10. States it feels like "a pencil is stabbing" her right axilla. Right arm, and chest are tender to touch. Reports swelling to right breast and states, if sometimes feels like her breast is as big as a "watermelon." Pain radiates to left shoulder and arm with certain, stretching movements.  PLAN: Established  therapeutic relationship. Education provided on palliative's role in collaboration with their Oncology/Radiation team. I will plan to see patient back in 2-4 weeks in collaboration to other oncology appointments.    Patient expressed understanding and was in agreement with this plan. She also understands that She can call the clinic at any time with any questions, concerns, or complaints.   Thank you for your referral and allowing Palliative to assist in Betty Anderson care.   Number and complexity of problems addressed: HIGH - 1 or more chronic illnesses with SEVERE exacerbation, progression, or side effects of treatment - advanced cancer, pain. Any controlled substances utilized were prescribed in the context of palliative care.   Visit consisted of counseling and education dealing with the complex and  emotionally intense issues of symptom management and palliative care in the setting of serious and potentially life-threatening illness.Greater than 50%  of this time was spent counseling and coordinating care related to the above assessment and plan.  Signed by: Moss Mc, RN MSN [facility] / NP Student   Signed by: Alda Lea, AGPCNP-BC Palliative Medicine Team/[place]   *Please note that this is a verbal dictation therefore any spelling or grammatical errors are due to the "[place] [employer]" system interpretation.

## 2022-07-01 NOTE — Progress Notes (Signed)
[place]  [phone] [phone]   Name: Betty Anderson Date: 07/01/2022 MRN: [id number]  DOB: 1955-05-06  Patient Care Team: [name], MD as PCP - General (Internal Medicine) Valrie Hart, RN as Oncology Nurse Navigator    REASON FOR CONSULTATION: Betty Anderson is a 67 y.o. female with oncologic medical history including non-small cell lung cancer (04/2019) currently on observation, as well as A-Flutter, CAD, COPD, and HTN.  Palliative ask to see for symptom and pain management and goals of care.    SOCIAL HISTORY:    Betty Anderson reports that she has been smoking cigarettes. She started smoking about 60 years ago. She has a 175.00 pack-year smoking history. When questioned about tobacco use, Betty Anderson is clear that she does not intend to quit smoking. She verbalizes understanding of the health risks associated with continued tobacco use. She has never used smokeless tobacco. She reports current alcohol use of about 8.0 standard drinks of alcohol per week. She reports that she does not use drugs.  ADVANCE DIRECTIVES:  None on file  CODE STATUS: not on file  PAST MEDICAL HISTORY: Past Medical History:  Diagnosis Date   COPD (chronic obstructive pulmonary disease)    emphysema   Hyperlipidemia    Hypertension    lung ca dx'd 03/2019   Lung nodule    Paroxysmal atrial flutter    per cardiology note   Peripheral artery disease    Tobacco abuse    Vitamin D deficiency    Wears glasses     PAST SURGICAL HISTORY:  Past Surgical History:  Procedure Laterality Date   BRONCHIAL NEEDLE ASPIRATION BIOPSY  04/12/2019   Procedure: BRONCHIAL NEEDLE ASPIRATION BIOPSIES;  Surgeon: Garner Nash, DO;  Location: [facility];  Service: Cardiopulmonary;;   ENDOBRONCHIAL ULTRASOUND N/A 04/12/2019   Procedure: ENDOBRONCHIAL ULTRASOUND;  Surgeon: Garner Nash, DO;  Location: [facility];  Service:  Cardiopulmonary;  Laterality: N/A;   OVARIAN CYST REMOVAL     TONSILLECTOMY     TUBAL LIGATION     VIDEO BRONCHOSCOPY N/A 04/12/2019   Procedure: VIDEO BRONCHOSCOPY WITHOUT FLUORO;  Surgeon: Garner Nash, DO;  Location: [facility];  Service: Cardiopulmonary;  Laterality: N/A;    HEMATOLOGY/ONCOLOGY HISTORY:  Oncology History  Malignant neoplasm of upper lobe of right lung  04/12/2019 Initial Diagnosis   Malignant neoplasm of upper lobe of right lung ([place])   05/03/2019 - 06/13/2019 Chemotherapy   The patient had palonosetron (ALOXI) injection 0.25 mg, 0.25 mg, Intravenous,  Once, 6 of 7 cycles Administration: 0.25 mg (05/03/2019), 0.25 mg (05/30/2019), 0.25 mg (05/11/2019), 0.25 mg (05/16/2019), 0.25 mg (05/23/2019) CARBOplatin (PARAPLATIN) 180 mg in sodium chloride 0.9 % 100 mL chemo infusion, 180 mg (100 % of original dose 177.8 mg), Intravenous,  Once, 6 of 7 cycles Dose modification: 177.8 mg (original dose 177.8 mg, Cycle 1) Administration: 180 mg (05/03/2019), 180 mg (05/30/2019), 180 mg (05/11/2019), 160 mg (05/16/2019), 180 mg (05/23/2019) PACLitaxel (TAXOL) 84 mg in sodium chloride 0.9 % 250 mL chemo infusion (</= 80mg /m2), 45 mg/m2 = 84 mg, Intravenous,  Once, 6 of 7 cycles Administration: 84 mg (05/03/2019), 84 mg (05/30/2019), 84 mg (05/11/2019), 84 mg (05/16/2019), 84 mg (05/23/2019)  for chemotherapy treatment.    07/21/2019 - 06/21/2020 Chemotherapy   Patient is on Treatment Plan : LUNG NSCLC Durvalumab q28d       ALLERGIES:  is allergic to lisinopril.  MEDICATIONS:  Current Outpatient Medications  Medication  Sig Dispense Refill   baclofen (LIORESAL) 10 MG tablet Take 1 tablet (10 mg total) by mouth 2 (two) times daily. 45 each 0   HYDROcodone-acetaminophen (NORCO) 10-325 MG tablet Take 1 tablet by mouth every 6 (six) hours as needed. 45 tablet 0   meloxicam (MOBIC) 7.5 MG tablet Take 1 tablet (7.5 mg total) by mouth daily. 14 tablet 0   aspirin EC 81 MG tablet Take 1 tablet (81 mg  total) by mouth daily. 90 tablet 3   Cholecalciferol (DIALYVITE VITAMIN D 5000) 125 MCG (5000 UT) capsule Take 5,000 Units by mouth daily.     escitalopram (LEXAPRO) 10 MG tablet Take 10 mg by mouth daily.     guaiFENesin (MUCINEX) 600 MG 12 hr tablet Take by mouth 2 (two) times daily.     pantoprazole (PROTONIX) 40 MG tablet TAKE ONE TABLET BY MOUTH TWICE A DAY 180 tablet 2   rosuvastatin (CRESTOR) 20 MG tablet Take 20 mg by mouth every evening.      Tiotropium Bromide-Olodaterol (STIOLTO RESPIMAT) 2.5-2.5 MCG/ACT AERS Inhale 2 puffs into the lungs daily. 4 g 0   vitamin B-12 (CYANOCOBALAMIN) 1000 MCG tablet Take 1,000 mcg by mouth daily.     No current facility-administered medications for this visit.    VITAL SIGNS: There were no vitals taken for this visit. There were no vitals filed for this visit.  Estimated body mass index is 29.76 kg/m as calculated from the following:   Height as of an earlier encounter on 07/01/22: 5\' 3"  (1.6 m).   Weight as of an earlier encounter on 07/01/22: 76.2 kg.  LABS: CBC:    Component Value Date/Time   WBC 5.9 06/27/2022 1104   WBC 8.1 04/12/2019 0615   HGB 11.4 (L) 06/27/2022 1104   HCT 34.7 (L) 06/27/2022 1104   PLT 284 06/27/2022 1104   MCV 94.6 06/27/2022 1104   NEUTROABS 4.1 06/27/2022 1104   LYMPHSABS 0.9 06/27/2022 1104   MONOABS 0.7 06/27/2022 1104   EOSABS 0.2 06/27/2022 1104   BASOSABS 0.1 06/27/2022 1104   Comprehensive Metabolic Panel:    Component Value Date/Time   NA 137 06/27/2022 1104   K 4.0 06/27/2022 1104   CL 106 06/27/2022 1104   CO2 27 06/27/2022 1104   BUN 20 06/27/2022 1104   CREATININE 1.48 (H) 06/27/2022 1104   GLUCOSE 84 06/27/2022 1104   CALCIUM 9.1 06/27/2022 1104   AST 10 (L) 06/27/2022 1104   ALT 8 06/27/2022 1104   ALKPHOS 70 06/27/2022 1104   BILITOT 0.4 06/27/2022 1104   PROT 7.4 06/27/2022 1104   ALBUMIN 3.6 06/27/2022 1104    RADIOGRAPHIC STUDIES: CT Chest W Contrast  Result Date:  06/30/2022 CLINICAL DATA:  Right lung cancer, diagnosed 2020. Left lung cancer, diagnosed 2022, status post chemotherapy and XRT, immunotherapy complete. EXAM: CT CHEST WITH CONTRAST TECHNIQUE: Multidetector CT imaging of the chest was performed during intravenous contrast administration. RADIATION DOSE REDUCTION: This exam was performed according to the departmental dose-optimization program which includes automated exposure control, adjustment of the mA and/or kV according to patient size and/or use of iterative reconstruction technique. CONTRAST:  74mL OMNIPAQUE IOHEXOL 300 MG/ML  SOLN COMPARISON:  03/25/2022 FINDINGS: Cardiovascular: The heart is normal in size. No pericardial effusion. No evidence of thoracic aneurysm. Atherosclerotic calcifications of the aortic arch. Moderate three-coronary atherosclerosis. Mediastinum/Nodes: 12 mm short axis subcarinal node (series 2/image 58), favored to be mildly progressive, indeterminate. Visualized thyroid is unremarkable. Lungs/Pleura: Radiation changes in the right  upper lobe. Progressive soft tissue along the medial aspect of the radiation changes (series 2/image 33), worrisome for possible tumor recurrence, although indeterminate. 11 x 9 mm subpleural nodule along the posterior right upper lobe (series 7/image 28), corresponding to the site of suspected recurrent tumor on prior PET, grossly unchanged. Faint centrilobular ground-glass nodularity in the lungs bilaterally. Superimposed subpleural reticulation/fibrosis, lower lobe predominant, favoring chronic interstitial lung disease. Mild centrilobular and paraseptal emphysematous changes, upper lung predominant. No pleural effusion or pneumothorax. Upper Abdomen: Visualized upper abdomen is grossly unremarkable, noting vascular calcifications. Musculoskeletal: Degenerative changes of the visualized thoracolumbar spine. Nondisplaced fracture of the right lateral 2nd rib (series 2/image 26), new. Lytic lesion involving  the right anterior 1st rib (series 2/image 21), new, raising the possibility isolated lytic metastasis, although equivocal. IMPRESSION: Radiation changes in the right upper lobe. Progressive soft tissue along the medial aspect of the radiation changes, worrisome for possible tumor recurrence, although indeterminate. Additional subpleural nodule in the posterior right lung apex, worrisome for recurrence on prior PET, is grossly unchanged. Consider follow-up PET-CT for further evaluation. 12 mm short axis subcarinal node, favored to be mildly progressive, suspicious for nodal metastasis. Nondisplaced fracture of the right lateral 2nd rib, new. This may be pathologic related to prior radiation changes. However, there is also a lytic lesion in the right anterior 1st rib which could reflect isolated lytic metastasis. Attention on PET is suggested. Aortic Atherosclerosis (ICD10-I70.0) and Emphysema (ICD10-J43.9). Electronically Signed   By: Julian Hy M.D.   On: 06/30/2022 02:51    PERFORMANCE STATUS (ECOG) : 1 - Symptomatic but completely ambulatory   Physical Exam General: NAD Cardiovascular: regular rate and rhythm Pulmonary: respirations even and unlabored, right upper lung lobe has longer inspiratory sound than left upper lobe Abdomen: soft, nontender, + bowel sounds Extremities: no edema, right arm movement limited, right arm tender to touch Skin: no rashes Neurological: alert, oriented, pleasant  IMPRESSION: This is Betty Anderson initial visit with palliative. She presents to visit with her husband Jeneen Rinks of 24 years. They have a blended family with total 5 children, 10 grandchildren, and 5 great grand children. Patient is originally from [place] and some of her children remain there, others live as far away as [place]. There are no children living locally. Betty Anderson has three dogs: a great Maxwell, Dennis, and a [place].   I introduced myself, Nikki NP, [place] RN, and Palliative's  role in collaboration with the oncology team. Concept of Palliative Care was introduced as specialized medical care for people and their families living with serious illness.  It focuses on providing relief from the symptoms and stress of a serious illness.  The goal is to improve quality of life for both the patient and the family. Values and goals of care important to patient and family were attempted to be elicited.   We discussed Betty Anderson's current illness and what it means in the larger context of her on-going co-morbidities. Natural disease trajectory and expectations were discussed. We discussed the importance of continued conversation with family and their medical providers regarding overall plan of care and treatment options, ensuring decisions are within the context of the patients values and GOCs.  She verbalizes understanding of current illness and condition. Was seen by Dr. Julien Nordmann and able to openly discuss visit findings and plans.    Pain:  Betty Anderson is most concerned about getting relief from her pain. Reports that pain is constant, ranging from 6-10 out of 10. States it feels  like "a pencil is stabbing" her right axilla. Right arm, and chest are tender to touch. Reports swelling to right breast and states, if sometimes feels like her breast is as big as a "watermelon." Pain radiates to left shoulder and arm with certain, stretching movements. Pain wakes patient up in the middle of the night and she finds it difficult to turn over in the bed.  Denies any recent falls. Upon further questioning, patient explains that she was cutting a cabbage and heard a cracking sound when she bore down with the knife. Imaging reveals rib fractures consistent with this description.  Will administer injectable Toradol at conclusion of today's visit for immediate benefit. Begin Baclofen 10 mg PO BID as needed, mobic 7.5mg  daily x14 days, and Norco to 10/325 mg PO every 6 hours as needed for pain.    Support further imaging to assess injury vs. cancer progression.  Extensive medication, psychosocial, and physical assessment completed. Patient denies use of illicit drugs. Education provided on guidelines for ongoing pain management and ability to continue with symptom management support. Patient verbalized understanding of pain contract and willingly sgned.   Recommend daily Senna-S for the management of constipation in the setting of opioid use.  2.   Dyspnea/fatigue: Betty Anderson endorses dyspnea and fatigue with walking even short distances. She reports that if she takes a short trip the grocery store she "pays for it the next day."  Encouraged patient to pace activities and incorporate rest periods.  Discussion regarding smoking cessation; patient is clear that she does not intend to quit smoking. She verbalizes understanding of the health risks associated with continued tobacco use.  PLAN: Established therapeutic relationship. Education provided on palliative's role in collaboration with their Oncology team. Will administer injectable Toradol at today's visit. Norco to 10/325 mg PO every 6 hr as needed for pain. Begin Baclofen 10 mg PO BID as needed. Mobic 7.5 mg daily x14 days Support further imaging to assess injury vs. cancer progression.  Recommend daily Senna-S for constipation. Encourage pacing of activity and incorporating rest periods. Smoking cessation information available if/when patient would like to discuss further. Pain contract completed.  Palliative will plan to see patient back in 2-4 weeks in collaboration to other oncology appointments.    Patient expressed understanding and was in agreement with this plan. She also understands that She can call the clinic at any time with any questions, concerns, or complaints.   Thank you for your referral and allowing Palliative to assist in Mrs. Betty Anderson care.   Number and complexity of problems  addressed: HIGH - 1 or more chronic illnesses with SEVERE exacerbation, progression, or side effects of treatment - advanced cancer, pain. Any controlled substances utilized were prescribed in the context of palliative care.  I assessed patient with Levada Dy, NP Student. Agree with above findings.   Visit consisted of counseling and education dealing with the complex and emotionally intense issues of symptom management and palliative care in the setting of serious and potentially life-threatening illness.Greater than 50%  of this time was spent counseling and coordinating care related to the above assessment and plan.  Signed by: Moss Mc, RN MSN [facility] / NP Student   Signed by: Alda Lea, AGPCNP-BC Palliative Medicine Team/[place] [place] [facility]   *Please note that this is a verbal dictation therefore any spelling or grammatical errors are due to the "[employer]" system interpretation.

## 2022-07-01 NOTE — Progress Notes (Signed)
Pt was seen by Lexine Baton, NP and was given IM toradol and PO oxycodone, pt tolerated well and was d/c'ed in stable condition with spouse without any complaints.

## 2022-07-02 ENCOUNTER — Telehealth: Payer: Self-pay | Admitting: Internal Medicine

## 2022-07-02 NOTE — Telephone Encounter (Signed)
Contacted patient to scheduled appointments. Patient is aware of appointments that are scheduled.   

## 2022-07-10 ENCOUNTER — Telehealth: Payer: Self-pay | Admitting: Nurse Practitioner

## 2022-07-14 ENCOUNTER — Other Ambulatory Visit: Payer: Self-pay

## 2022-07-14 DIAGNOSIS — G893 Neoplasm related pain (acute) (chronic): Secondary | ICD-10-CM

## 2022-07-14 DIAGNOSIS — Z515 Encounter for palliative care: Secondary | ICD-10-CM

## 2022-07-14 DIAGNOSIS — C3411 Malignant neoplasm of upper lobe, right bronchus or lung: Secondary | ICD-10-CM

## 2022-07-14 MED ORDER — HYDROCODONE-ACETAMINOPHEN 10-325 MG PO TABS: 1.0000 | ORAL_TABLET | Freq: Four times a day (QID) | ORAL | 0 refills | Status: DC | PRN

## 2022-07-14 NOTE — Telephone Encounter (Signed)
Pt called for refill pf pain meds, see new order

## 2022-07-20 ENCOUNTER — Other Ambulatory Visit: Payer: Self-pay | Admitting: Nurse Practitioner

## 2022-07-20 DIAGNOSIS — G893 Neoplasm related pain (acute) (chronic): Secondary | ICD-10-CM

## 2022-07-20 DIAGNOSIS — Z515 Encounter for palliative care: Secondary | ICD-10-CM

## 2022-07-20 DIAGNOSIS — C3411 Malignant neoplasm of upper lobe, right bronchus or lung: Secondary | ICD-10-CM

## 2022-07-22 NOTE — Progress Notes (Unsigned)
Palliative Medicine [facility]  [phone]) [phone] [phone]) [phone]   Name: Betty Anderson Date: 07/22/2022 MRN: [id number]  DOB: 02/09/1956  Patient Care Team: Rodrigo Ran, MD as PCP - General (Internal Medicine) Syliva Overman, RN as Oncology Nurse Navigator    INTERVAL HISTORY: Betty Anderson is a 67 y.o. female with oncologic medical history including non-small cell lung cancer (04/2019) currently on observation, as well as A-Flutter, CAD, COPD, and HTN. Palliative ask to see for symptom and pain management and goals of care.   SOCIAL HISTORY:    Betty Anderson reports that she has been smoking cigarettes. She started smoking about 60 years ago. She has a 175.00 pack-year smoking history. She has never used smokeless tobacco. She reports current alcohol use of about 8.0 standard drinks of alcohol per week. She reports that she does not use drugs.  ADVANCE DIRECTIVES:  None on file  CODE STATUS: Full Code  PAST MEDICAL HISTORY: Past Medical History:  Diagnosis Date   COPD (chronic obstructive pulmonary disease)    emphysema   Hyperlipidemia    Hypertension    lung ca dx'd 03/2019   Lung nodule    Paroxysmal atrial flutter    per cardiology note   Peripheral artery disease    Tobacco abuse    Vitamin D deficiency    Wears glasses     ALLERGIES:  is allergic to lisinopril.  MEDICATIONS:  Current Outpatient Medications  Medication Sig Dispense Refill   aspirin EC 81 MG tablet Take 1 tablet (81 mg total) by mouth daily. 90 tablet 3   baclofen (LIORESAL) 10 MG tablet Take 1 tablet (10 mg total) by mouth 2 (two) times daily. 45 each 0   Cholecalciferol (DIALYVITE VITAMIN D 5000) 125 MCG (5000 UT) capsule Take 5,000 Units by mouth daily.     escitalopram (LEXAPRO) 10 MG tablet Take 10 mg by mouth daily.     guaiFENesin (MUCINEX) 600 MG 12 hr tablet Take by mouth 2 (two) times daily.     HYDROcodone-acetaminophen (NORCO) 10-325 MG  tablet Take 1 tablet by mouth every 6 (six) hours as needed. 60 tablet 0   meloxicam (MOBIC) 7.5 MG tablet TAKE ONE TABLET BY MOUTH ONE TIME DAILY 14 tablet 0   pantoprazole (PROTONIX) 40 MG tablet TAKE ONE TABLET BY MOUTH TWICE A DAY 180 tablet 2   rosuvastatin (CRESTOR) 20 MG tablet Take 20 mg by mouth every evening.      Tiotropium Bromide-Olodaterol (STIOLTO RESPIMAT) 2.5-2.5 MCG/ACT AERS Inhale 2 puffs into the lungs daily. 4 g 0   vitamin B-12 (CYANOCOBALAMIN) 1000 MCG tablet Take 1,000 mcg by mouth daily.     No current facility-administered medications for this visit.    VITAL SIGNS: BP (!) 86/53 (BP Location: Left Arm, Patient Position: Sitting) Comment: NP aware  Pulse 67   Temp 97.9 F (36.6 C) (Oral)   Resp 18   Ht  (1.6 m)   Wt 77.7 kg   SpO2 100%   BMI 30.36 kg/m  There were no vitals filed for this visit.  Estimated body mass index is 30.36 kg/m as calculated from the following:   Height as of this encounter:  (1.6 m).   Weight as of this encounter: 77.7 kg.   PERFORMANCE STATUS (ECOG) : 1 - Symptomatic but completely ambulatory   Physical Exam General: NAD Cardiovascular: regular rate and rhythm Pulmonary: clear ant fields Abdomen: soft, nontender, + bowel sounds Extremities: left  ankle and foot 2+ edema Skin: red, scabbed area to left ankle Neurological: alert, oriented x 4  IMPRESSION: Betty Anderson presents to visit today with her husband. No acute distress noted. Ambulatory. Remaining as active as possible.      Pain:  Betty Anderson endorses continued pain to her right chest and right arm. The area is tender and she states it feels like a "screw driver" being driven into her.  Before taking pain medication, reports the pain is 10 out of 10. Following pain medication, pain decreases to 7-8 out of 10. Betty Anderson reports that sleep remains difficult. She takes her daily Mobic at night-time and notices that it wears off after a few hours.    Reports taking Baclofen 10 mg PO BID as needed. Also takes Norco 10/325 mg PO every 6 hours as needed for pain on a regular basis. Reports that she has her husband to wake her up during the night to take. Explains that if she does not do this - she will awaken with pain that is unbearable. Education provided on use of as needed medication.   Betty Anderson's blood pressure is checked several times today and all readings are hypotensive. Discussed with patient and her husband that given her recent medical changes - left ankle/foot swelling and wound and low blood pressure - Betty Anderson will need obtain an appointment with her PCP/Cardiologist to address these issues. She expresses her blood pressure is always on the lower side which is why they discontinued her antihypertensives. Advised concerns with readings in low 50's. No additional pain medication will be prescribed at this time - as doing so presents significant risk to patient's health and safety. Education provided that additional pain medication could worsen patient's hypotension. Betty Anderson verbalizes understanding that she will need to see her PCP to have these medical concerns addressed. Plan in place for patient to notify palliative care team once she has done this so that pain can be re-evaluated and medications filled and/or adjusted.   2.   Dyspnea/fatigue: Betty Anderson endorses shortness of breath with activity and reports that this is usual given her underlying COPD. Expresses concern, however, regarding having coughed up bright, red blood this past Thursday or Friday. Reports that there was only the one episode. Denies current or recent N/V.    3.  Swelling/Wound to left ankle Betty Anderson's left ankle and foot are swollen with 2+ pitting edema. She reports that she has had this swelling x 1 week. When sock is removed to inspect foot closer, there is a nickel-sized dark, scabbed area with an approximately 1 inch red border  of inflammation. Patient keeps area covered with Band-Aid. Reports that while the swelling is new, the wound is older. About a year ago, patient reports having an incident in which a dog got under her feet and she kicked a trash can.   Encouraged patient to follow-up with her PCP and cardiologist. No signs of infection or cellulitis.  4.  Constipation: Mrs. Sforza reports that her usual bowel pattern is every 2-3 days. Recently, however, it has become more difficult to pass stool. Instructed to increase Senna-S to 2 tablets twice daily for the management of constipation in the setting of opioid use.    5.  Goals of Care Mrs. Davitt's immediate goal is to gain better pain relief. Treat the treatable allowing her every opportunity to continue to thrive.    PLAN: No fills and no changes to pain medication regimen at this  time (given new left ankle/foot swelling and wound and hypotension). Mrs. [place]eland is instructed to set up appointment with her PCP/Cardiologist as soon as possible for evaluation of her medical issues. Increase Senna-S to 2 tablets twice daily for constipation. Will await call from patient regarding visit with her PCP. Afterwards, pain to be re-evaluated and medications filled and/or adjusted. Palliative will plan to see patient back in 2-4 weeks, or sooner, in collaboration to other oncology appointments.    Patient expressed understanding and was in agreement with this plan. She also understands that she can call the clinic at any time with any questions, concerns, or complaints.   Any controlled substances utilized were prescribed in the context of palliative care. PDMP has been reviewed.   I assessed patient with Marylene Land, NP Student. Agree with above findings.   Visit consisted of counseling and education dealing with the complex and emotionally intense issues of symptom management and palliative care in the setting of serious and potentially life-threatening  illness.Greater than 50%  of this time was spent counseling and coordinating care related to the above assessment and plan.   Signed by: Katy Apo, RN MSN [facility] / NP Student  Willette Alma, AGPCNP-BC  Palliative Medicine Team/[facility]  *Please note that this is a verbal dictation therefore any spelling or grammatical errors are due to the "[employer]" system interpretation.

## 2022-07-23 ENCOUNTER — Inpatient Hospital Stay (HOSPITAL_BASED_OUTPATIENT_CLINIC_OR_DEPARTMENT_OTHER): Payer: Medicare Other | Admitting: Nurse Practitioner

## 2022-07-23 ENCOUNTER — Encounter: Payer: Self-pay | Admitting: Nurse Practitioner

## 2022-07-23 VITALS — BP 86/53 | HR 67 | Temp 97.9°F | Resp 18 | Ht 63.0 in | Wt 171.4 lb

## 2022-07-23 DIAGNOSIS — R530 Neoplastic (malignant) related fatigue: Secondary | ICD-10-CM | POA: Diagnosis not present

## 2022-07-23 DIAGNOSIS — K5903 Drug induced constipation: Secondary | ICD-10-CM

## 2022-07-23 DIAGNOSIS — Z515 Encounter for palliative care: Secondary | ICD-10-CM | POA: Diagnosis not present

## 2022-07-23 DIAGNOSIS — C3411 Malignant neoplasm of upper lobe, right bronchus or lung: Secondary | ICD-10-CM | POA: Diagnosis not present

## 2022-07-28 ENCOUNTER — Telehealth: Payer: Self-pay | Admitting: Nurse Practitioner

## 2022-07-28 NOTE — Progress Notes (Unsigned)
Palliative Medicine [facility]  Telephone:(336) [phone] Fax:(336) [phone]   Name: Betty Anderson Date: 07/28/2022 MRN: [id number]  DOB: 01/30/1956  Patient Care Team: Rodrigo Ran, MD as PCP - General (Internal Medicine) Syliva Overman, RN as Oncology Nurse Navigator    INTERVAL HISTORY: Betty Anderson is a 67 y.o. female with oncologic medical history including non-small cell lung cancer (04/2019) currently on observation, as well as A-Flutter, CAD, COPD, and HTN. Palliative ask to see for symptom and pain management and goals of care.   SOCIAL HISTORY:    [name] reports that she has been smoking cigarettes. She started smoking about 60 years ago. She has a 175.00 pack-year smoking history. She has never used smokeless tobacco. She reports current alcohol use of about 8.0 standard drinks of alcohol per week. She reports that she does not use drugs.  ADVANCE DIRECTIVES:  None on file  CODE STATUS: Full Code  PAST MEDICAL HISTORY: Past Medical History:  Diagnosis Date   COPD (chronic obstructive pulmonary disease) (HCC)    emphysema   Hyperlipidemia    Hypertension    lung ca dx'd 03/2019   Lung nodule    Paroxysmal atrial flutter (HCC)    per cardiology note   Peripheral artery disease (HCC)    Tobacco abuse    Vitamin D deficiency    Wears glasses     ALLERGIES:  is allergic to lisinopril.  MEDICATIONS:  Current Outpatient Medications  Medication Sig Dispense Refill   aspirin EC 81 MG tablet Take 1 tablet (81 mg total) by mouth daily. 90 tablet 3   baclofen (LIORESAL) 10 MG tablet Take 1 tablet (10 mg total) by mouth 2 (two) times daily. 45 each 0   Cholecalciferol (DIALYVITE VITAMIN D 5000) 125 MCG (5000 UT) capsule Take 5,000 Units by mouth daily.     escitalopram (LEXAPRO) 10 MG tablet Take 10 mg by mouth daily.     guaiFENesin (MUCINEX) 600 MG 12 hr tablet Take by mouth 2 (two) times daily.     HYDROcodone-acetaminophen  (NORCO) 10-325 MG tablet Take 1 tablet by mouth every 6 (six) hours as needed. 60 tablet 0   meloxicam (MOBIC) 7.5 MG tablet TAKE ONE TABLET BY MOUTH ONE TIME DAILY 14 tablet 0   pantoprazole (PROTONIX) 40 MG tablet TAKE ONE TABLET BY MOUTH TWICE A DAY 180 tablet 2   rosuvastatin (CRESTOR) 20 MG tablet Take 20 mg by mouth every evening.      Tiotropium Bromide-Olodaterol (STIOLTO RESPIMAT) 2.5-2.5 MCG/ACT AERS Inhale 2 puffs into the lungs daily. 4 g 0   vitamin B-12 (CYANOCOBALAMIN) 1000 MCG tablet Take 1,000 mcg by mouth daily.     No current facility-administered medications for this visit.    VITAL SIGNS: There were no vitals taken for this visit. There were no vitals filed for this visit.  Estimated body mass index is 30.36 kg/m as calculated from the following:   Height as of 07/23/22: 5\' 3"  (1.6 m).   Weight as of 07/23/22: 171 lb 6.4 oz (77.7 kg).   PERFORMANCE STATUS (ECOG) : 1 - Symptomatic but completely ambulatory   Physical Exam General: NAD Cardiovascular: regular rate and rhythm Pulmonary: clear ant fields Abdomen: soft, nontender, + bowel sounds Extremities: left ankle and foot 2+ edema Skin: red, scabbed area to left ankle Neurological: alert, oriented x 4  IMPRESSION:      Pain:  Mrs. Ignasiak endorses continued pain to her right chest and right  arm. The area is tender and she states it feels like a "screw driver" being driven into her.  Before taking pain medication, reports the pain is 10 out of 10. Following pain medication, pain decreases to 7-8 out of 10. Mrs. Hauswirth reports that sleep remains difficult. She takes her daily Mobic at night-time and notices that it wears off after a few hours.   Reports taking Baclofen 10 mg PO BID as needed. Also takes Norco 10/325 mg PO every 6 hours as needed for pain on a regular basis. Reports that she has her husband to wake her up during the night to take. Explains that if she does not do this - she will awaken  with pain that is unbearable. Education provided on use of as needed medication.   Mrs. Altemus's blood pressure is checked several times today and all readings are hypotensive. Discussed with patient and her husband that given her recent medical changes - left ankle/foot swelling and wound and low blood pressure - Mrs. Youngberg will need obtain an appointment with her PCP/Cardiologist to address these issues. She expresses her blood pressure is always on the lower side which is why they discontinued her antihypertensives. Advised concerns with readings in low 50's. No additional pain medication will be prescribed at this time - as doing so presents significant risk to patient's health and safety. Education provided that additional pain medication could worsen patient's hypotension. Mrs. Appelhans verbalizes understanding that she will need to see her PCP to have these medical concerns addressed. Plan in place for patient to notify palliative care team once she has done this so that pain can be re-evaluated and medications filled and/or adjusted.   2.   Dyspnea/fatigue:    3.  Swelling/Wound to left ankle   4.  Constipation:     5.  Goals of Care Mrs. Devino's immediate goal is to gain better pain relief. Treat the treatable allowing her every opportunity to continue to thrive.    PLAN: No fills and no changes to pain medication regimen at this time (given new left ankle/foot swelling and wound and hypotension). Mrs. Colligan is instructed to set up appointment with her PCP/Cardiologist as soon as possible for evaluation of her medical issues. Increase Senna-S to 2 tablets twice daily for constipation. Will await call from patient regarding visit with her PCP. Afterwards, pain to be re-evaluated and medications filled and/or adjusted. Palliative will plan to see patient back in 2-4 weeks, or sooner, in collaboration to other oncology appointments.    Patient expressed understanding  and was in agreement with this plan. She also understands that she can call the clinic at any time with any questions, concerns, or complaints.   Any controlled substances utilized were prescribed in the context of palliative care. PDMP has been reviewed.   Visit consisted of counseling and education dealing with the complex and emotionally intense issues of symptom management and palliative care in the setting of serious and potentially life-threatening illness.Greater than 50%  of this time was spent counseling and coordinating care related to the above assessment and plan.   Willette Alma, AGPCNP-BC  Palliative Medicine Team/[facility]  *Please note that this is a verbal dictation therefore any spelling or grammatical errors are due to the "Dragon [employer] One" system interpretation.

## 2022-07-29 ENCOUNTER — Encounter: Payer: Self-pay | Admitting: Nurse Practitioner

## 2022-07-29 ENCOUNTER — Other Ambulatory Visit: Payer: Self-pay

## 2022-07-29 ENCOUNTER — Inpatient Hospital Stay (HOSPITAL_BASED_OUTPATIENT_CLINIC_OR_DEPARTMENT_OTHER): Payer: Medicare Other | Admitting: Nurse Practitioner

## 2022-07-29 VITALS — BP 68/50 | HR 89 | Temp 97.6°F | Resp 18

## 2022-07-29 DIAGNOSIS — G893 Neoplasm related pain (acute) (chronic): Secondary | ICD-10-CM | POA: Diagnosis not present

## 2022-07-29 DIAGNOSIS — I959 Hypotension, unspecified: Secondary | ICD-10-CM | POA: Diagnosis not present

## 2022-07-29 DIAGNOSIS — Z515 Encounter for palliative care: Secondary | ICD-10-CM | POA: Diagnosis not present

## 2022-07-29 DIAGNOSIS — C3411 Malignant neoplasm of upper lobe, right bronchus or lung: Secondary | ICD-10-CM | POA: Diagnosis not present

## 2022-07-30 MED ORDER — MELOXICAM 7.5 MG PO TABS: 7.5000 mg | ORAL_TABLET | Freq: Every day | ORAL | 0 refills | Status: DC

## 2022-07-30 MED ORDER — HYDROCODONE-ACETAMINOPHEN 10-325 MG PO TABS: 1.0000 | ORAL_TABLET | Freq: Four times a day (QID) | ORAL | 0 refills | Status: DC | PRN

## 2022-07-30 NOTE — Addendum Note (Signed)
Addended by: Glee Arvin on: 07/30/2022 03:59 PM   Modules accepted: Orders

## 2022-08-08 ENCOUNTER — Other Ambulatory Visit: Payer: Self-pay

## 2022-08-08 DIAGNOSIS — Z515 Encounter for palliative care: Secondary | ICD-10-CM

## 2022-08-08 DIAGNOSIS — G893 Neoplasm related pain (acute) (chronic): Secondary | ICD-10-CM

## 2022-08-08 DIAGNOSIS — C3411 Malignant neoplasm of upper lobe, right bronchus or lung: Secondary | ICD-10-CM

## 2022-08-08 MED ORDER — HYDROCODONE-ACETAMINOPHEN 10-325 MG PO TABS: 1.0000 | ORAL_TABLET | Freq: Four times a day (QID) | ORAL | 0 refills | Status: DC | PRN

## 2022-08-08 MED ORDER — BACLOFEN 10 MG PO TABS: 10.0000 mg | ORAL_TABLET | Freq: Two times a day (BID) | ORAL | 0 refills | Status: DC

## 2022-08-08 NOTE — Telephone Encounter (Signed)
Pt called for refills, see new orders 

## 2022-08-26 ENCOUNTER — Other Ambulatory Visit: Payer: Self-pay

## 2022-08-26 DIAGNOSIS — C3411 Malignant neoplasm of upper lobe, right bronchus or lung: Secondary | ICD-10-CM

## 2022-08-26 DIAGNOSIS — G893 Neoplasm related pain (acute) (chronic): Secondary | ICD-10-CM

## 2022-08-26 DIAGNOSIS — Z515 Encounter for palliative care: Secondary | ICD-10-CM

## 2022-08-27 MED ORDER — MELOXICAM 7.5 MG PO TABS: 7.5000 mg | ORAL_TABLET | Freq: Every day | ORAL | 0 refills | Status: DC

## 2022-08-27 MED ORDER — HYDROCODONE-ACETAMINOPHEN 10-325 MG PO TABS: 1.0000 | ORAL_TABLET | Freq: Four times a day (QID) | ORAL | 0 refills | Status: DC | PRN

## 2022-09-01 ENCOUNTER — Encounter (HOSPITAL_COMMUNITY): Payer: Self-pay

## 2022-09-01 ENCOUNTER — Ambulatory Visit (HOSPITAL_COMMUNITY)
Admission: RE | Admit: 2022-09-01 | Discharge: 2022-09-01 | Disposition: A | Discharge status: Home / Self Care | Payer: Medicare Other | Source: Ambulatory Visit | Attending: Internal Medicine | Admitting: Internal Medicine

## 2022-09-01 ENCOUNTER — Inpatient Hospital Stay: Payer: Medicare Other | Attending: Physician Assistant

## 2022-09-01 DIAGNOSIS — R0609 Other forms of dyspnea: Secondary | ICD-10-CM | POA: Insufficient documentation

## 2022-09-01 DIAGNOSIS — M25511 Pain in right shoulder: Secondary | ICD-10-CM | POA: Insufficient documentation

## 2022-09-01 DIAGNOSIS — F17210 Nicotine dependence, cigarettes, uncomplicated: Secondary | ICD-10-CM | POA: Insufficient documentation

## 2022-09-01 DIAGNOSIS — M2550 Pain in unspecified joint: Secondary | ICD-10-CM | POA: Insufficient documentation

## 2022-09-01 DIAGNOSIS — C3411 Malignant neoplasm of upper lobe, right bronchus or lung: Secondary | ICD-10-CM | POA: Insufficient documentation

## 2022-09-01 DIAGNOSIS — C3490 Malignant neoplasm of unspecified part of unspecified bronchus or lung: Secondary | ICD-10-CM | POA: Diagnosis not present

## 2022-09-01 DIAGNOSIS — R5383 Other fatigue: Secondary | ICD-10-CM | POA: Insufficient documentation

## 2022-09-01 DIAGNOSIS — Z923 Personal history of irradiation: Secondary | ICD-10-CM | POA: Insufficient documentation

## 2022-09-01 DIAGNOSIS — Z79899 Other long term (current) drug therapy: Secondary | ICD-10-CM | POA: Insufficient documentation

## 2022-09-01 DIAGNOSIS — Z888 Allergy status to other drugs, medicaments and biological substances status: Secondary | ICD-10-CM | POA: Insufficient documentation

## 2022-09-01 DIAGNOSIS — J439 Emphysema, unspecified: Secondary | ICD-10-CM | POA: Insufficient documentation

## 2022-09-01 LAB — CMP (CANCER CENTER ONLY)
ALT: 6 U/L (ref 0–44)
AST: 11 U/L — ABNORMAL LOW (ref 15–41)
Albumin: 3.7 g/dL (ref 3.5–5.0)
Alkaline Phosphatase: 68 U/L (ref 38–126)
Anion gap: 5 (ref 5–15)
BUN: 22 mg/dL (ref 8–23)
CO2: 23 mmol/L (ref 22–32)
Calcium: 9.2 mg/dL (ref 8.9–10.3)
Chloride: 109 mmol/L (ref 98–111)
Creatinine: 1.58 mg/dL — ABNORMAL HIGH (ref 0.44–1.00)
GFR, Estimated: 35 mL/min — ABNORMAL LOW (ref >60)
Glucose, Bld: 77 mg/dL (ref 70–99)
Potassium: 4.2 mmol/L (ref 3.5–5.1)
Sodium: 137 mmol/L (ref 135–145)
Total Bilirubin: 0.3 mg/dL (ref 0.3–1.2)
Total Protein: 7.4 g/dL (ref 6.5–8.1)

## 2022-09-01 LAB — CBC WITH DIFFERENTIAL (CANCER CENTER ONLY)
Abs Immature Granulocytes: 0.01 10*3/uL (ref 0.00–0.07)
Basophils Absolute: 0.1 10*3/uL (ref 0.0–0.1)
Basophils Relative: 1 %
Eosinophils Absolute: 0.2 10*3/uL (ref 0.0–0.5)
Eosinophils Relative: 3 %
HCT: 34.8 % — ABNORMAL LOW (ref 36.0–46.0)
Hemoglobin: 11.6 g/dL — ABNORMAL LOW (ref 12.0–15.0)
Immature Granulocytes: 0 %
Lymphocytes Relative: 21 %
Lymphs Abs: 1.1 10*3/uL (ref 0.7–4.0)
MCH: 31.7 pg (ref 26.0–34.0)
MCHC: 33.3 g/dL (ref 30.0–36.0)
MCV: 95.1 fL (ref 80.0–100.0)
Monocytes Absolute: 0.4 10*3/uL (ref 0.1–1.0)
Monocytes Relative: 8 %
Neutro Abs: 3.4 10*3/uL (ref 1.7–7.7)
Neutrophils Relative %: 67 %
Platelet Count: 260 10*3/uL (ref 150–400)
RBC: 3.66 MIL/uL — ABNORMAL LOW (ref 3.87–5.11)
RDW: 14.7 % (ref 11.5–15.5)
WBC Count: 5.1 10*3/uL (ref 4.0–10.5)
nRBC: 0 % (ref 0.0–0.2)

## 2022-09-01 MED ORDER — IOHEXOL 300 MG/ML  SOLN
60.0000 mL | Freq: Once | INTRAMUSCULAR | Status: AC | PRN
  Administered 2022-09-01: 60 mL via INTRAVENOUS

## 2022-09-01 MED ORDER — SODIUM CHLORIDE (PF) 0.9 % IJ SOLN
INTRAMUSCULAR | Status: AC
  Filled 2022-09-01: qty 50

## 2022-09-02 NOTE — Progress Notes (Unsigned)
Palliative Medicine [facility]  Telephone:(336) [phone] Fax:(336) [phone]   Name: Betty Anderson Date: 09/02/2022 MRN: [id number]  DOB: September 23, 1955  Patient Care Team: Rodrigo Ran, MD as PCP - General (Internal Medicine) Syliva Overman, RN as Oncology Nurse Navigator   I connected with Betty Anderson on 09/02/22 at 12:00 PM EDT by phone and verified that I am speaking with the correct person using two identifiers.   I discussed the limitations, risks, security and privacy concerns of performing an evaluation and management service by telemedicine and the availability of in-person appointments. I also discussed with the patient that there may be a patient responsible charge related to this service. The patient expressed understanding and agreed to proceed.   Other persons participating in the visit and their role in the encounter: n/a   Patient's location: home Provider's location: [facility]   Chief Complaint: F/u of symptom management   INTERVAL HISTORY: Betty Anderson is a 67 y.o. female with oncologic medical history including non-small cell lung cancer (04/2019) currently on observation, as well as A-Flutter, CAD, COPD, and HTN. Palliative ask to see for symptom and pain management and goals of care.   SOCIAL HISTORY:    Betty Anderson reports that she has been smoking cigarettes. She started smoking about 60 years ago. She has a 175.00 pack-year smoking history. She has never used smokeless tobacco. She reports current alcohol use of about 8.0 standard drinks of alcohol per week. She reports that she does not use drugs.  ADVANCE DIRECTIVES:  None on file  CODE STATUS: Full Code  PAST MEDICAL HISTORY: Past Medical History:  Diagnosis Date   COPD (chronic obstructive pulmonary disease) (HCC)    emphysema   Hyperlipidemia    Hypertension    lung ca dx'd 03/2019   Lung nodule    Paroxysmal atrial flutter (HCC)    per cardiology note    Peripheral artery disease (HCC)    Tobacco abuse    Vitamin D deficiency    Wears glasses     ALLERGIES:  is allergic to lisinopril.  MEDICATIONS:  Current Outpatient Medications  Medication Sig Dispense Refill   aspirin EC 81 MG tablet Take 1 tablet (81 mg total) by mouth daily. 90 tablet 3   baclofen (LIORESAL) 10 MG tablet Take 1 tablet (10 mg total) by mouth 2 (two) times daily. 45 each 0   Cholecalciferol (DIALYVITE VITAMIN D 5000) 125 MCG (5000 UT) capsule Take 5,000 Units by mouth daily.     escitalopram (LEXAPRO) 10 MG tablet Take 10 mg by mouth daily.     guaiFENesin (MUCINEX) 600 MG 12 hr tablet Take by mouth 2 (two) times daily.     HYDROcodone-acetaminophen (NORCO) 10-325 MG tablet Take 1 tablet by mouth every 6 (six) hours as needed. 45 tablet 0   meloxicam (MOBIC) 7.5 MG tablet Take 1 tablet (7.5 mg total) by mouth daily. 30 tablet 0   pantoprazole (PROTONIX) 40 MG tablet TAKE ONE TABLET BY MOUTH TWICE A DAY 180 tablet 2   rosuvastatin (CRESTOR) 20 MG tablet Take 20 mg by mouth every evening.      Tiotropium Bromide-Olodaterol (STIOLTO RESPIMAT) 2.5-2.5 MCG/ACT AERS Inhale 2 puffs into the lungs daily. 4 g 0   vitamin B-12 (CYANOCOBALAMIN) 1000 MCG tablet Take 1,000 mcg by mouth daily.     No current facility-administered medications for this visit.    VITAL SIGNS: There were no vitals taken for this visit. There were no vitals  filed for this visit.  Estimated body mass index is 30.36 kg/m as calculated from the following:   Height as of 07/23/22: 5\' 3"  (1.6 m).   Weight as of 07/23/22: 171 lb 6.4 oz (77.7 kg).   PERFORMANCE STATUS (ECOG) : 1 - Symptomatic but completely ambulatory   Physical Exam General: NAD Cardiovascular: regular rate and rhythm Pulmonary: clear ant fields Abdomen: soft, nontender, + bowel sounds Extremities: left ankle and foot 2+ edema Skin: scattered scabs and bruises, dressing in place to left ankle Neurological: alert, oriented x  4  IMPRESSION:    Pain:  Betty Anderson endorses continued, severe pain and tenderness to her right chest and shoulder. She is understandably concerned about obtaining pain medication moving forward in the setting of hypotension.  At today's visit, her blood pressure is checked and is 68/50. It is re-checked and verified. Patient reports that she has been checking her blood pressure at home and it is running in the 60's there as well. She states that to her it is just a number. She endorses some dizziness, but no more than usual. Expresses not understanding why everyone is so concerned about the readings. Betty Anderson did see her PCP and SBP at that visit was in lower 100's. Education provided that SBP 60's is much lower than previous visits and reason for concern, as pressures are trending downward.  Encouraged patient to reach out to her cardiologist for a visit regarding her worsening hypotension. She reports having a visit a couple of months from now. She is encouraged to move this visit forward.   Will send in refills for Mobic and Norco.   Patient verbalizes understanding that she will need to see her cardiologist about hypotension before additional medications can safely be prescribed. Provided education on risk of hypotension and cardiac health. She verbalizes understanding of the risk of opioids lowering her pressures further. Unfortunately we are in a difficult place given she has pain however for her safety will not continue to treat pain from this perspective until clearance has been provided. PCP recognizes and have cleared from their perspective ability to receiving pain management. Again confirmed with patient this will also need to be cleared by cardiology given significant hypotension even in setting of DNR/DNI given patient is not on hospice.    3.  Swelling/Wound to left ankle   4.  Goals of Care Betty Anderson's immediate goal is to gain better pain relief. Treat the  treatable allowing her every opportunity to continue to thrive.    PLAN: Fills sent for Mobic and Norco.  Instructed to see cardiologist for evaluation and treatment of worsening hypotension. Continue Senna-S to 2 tablets twice daily for constipation. Will await follow-up with cardiology. Afterwards, pain to be re-evaluated and medications filled and/or adjusted. Palliative will plan to see patient back in 2-4 weeks, or sooner, in collaboration to other oncology appointments.    Patient expressed understanding and was in agreement with this plan. She also understands that she can call the clinic at any time with any questions, concerns, or complaints.   Any controlled substances utilized were prescribed in the context of palliative care. PDMP has been reviewed.   Visit consisted of counseling and education dealing with the complex and emotionally intense issues of symptom management and palliative care in the setting of serious and potentially life-threatening illness.Greater than 50%  of this time was spent counseling and coordinating care related to the above assessment and plan.   Willette Alma, AGPCNP-BC  Palliative Medicine Team/[facility]  *Please note that this is a verbal dictation therefore any spelling or grammatical errors are due to the "Dragon Medical One" system interpretation.

## 2022-09-04 ENCOUNTER — Inpatient Hospital Stay (HOSPITAL_BASED_OUTPATIENT_CLINIC_OR_DEPARTMENT_OTHER): Payer: Medicare Other | Admitting: Internal Medicine

## 2022-09-04 ENCOUNTER — Inpatient Hospital Stay (HOSPITAL_BASED_OUTPATIENT_CLINIC_OR_DEPARTMENT_OTHER): Payer: Medicare Other | Admitting: Nurse Practitioner

## 2022-09-04 ENCOUNTER — Encounter: Payer: Self-pay | Admitting: Nurse Practitioner

## 2022-09-04 VITALS — BP 102/55 | HR 73 | Temp 97.7°F | Resp 16 | Wt 163.5 lb

## 2022-09-04 DIAGNOSIS — C3490 Malignant neoplasm of unspecified part of unspecified bronchus or lung: Secondary | ICD-10-CM

## 2022-09-04 DIAGNOSIS — C3411 Malignant neoplasm of upper lobe, right bronchus or lung: Secondary | ICD-10-CM | POA: Diagnosis present

## 2022-09-04 DIAGNOSIS — R0609 Other forms of dyspnea: Secondary | ICD-10-CM | POA: Diagnosis not present

## 2022-09-04 DIAGNOSIS — M2550 Pain in unspecified joint: Secondary | ICD-10-CM | POA: Diagnosis not present

## 2022-09-04 DIAGNOSIS — J439 Emphysema, unspecified: Secondary | ICD-10-CM | POA: Diagnosis not present

## 2022-09-04 DIAGNOSIS — Z515 Encounter for palliative care: Secondary | ICD-10-CM | POA: Diagnosis not present

## 2022-09-04 DIAGNOSIS — Z923 Personal history of irradiation: Secondary | ICD-10-CM | POA: Diagnosis not present

## 2022-09-04 DIAGNOSIS — R5383 Other fatigue: Secondary | ICD-10-CM | POA: Diagnosis not present

## 2022-09-04 DIAGNOSIS — Z888 Allergy status to other drugs, medicaments and biological substances status: Secondary | ICD-10-CM | POA: Diagnosis not present

## 2022-09-04 DIAGNOSIS — G893 Neoplasm related pain (acute) (chronic): Secondary | ICD-10-CM | POA: Diagnosis not present

## 2022-09-04 DIAGNOSIS — F17210 Nicotine dependence, cigarettes, uncomplicated: Secondary | ICD-10-CM | POA: Diagnosis not present

## 2022-09-04 DIAGNOSIS — Z79899 Other long term (current) drug therapy: Secondary | ICD-10-CM | POA: Diagnosis not present

## 2022-09-04 DIAGNOSIS — M25511 Pain in right shoulder: Secondary | ICD-10-CM | POA: Diagnosis not present

## 2022-09-04 NOTE — Progress Notes (Signed)
Pain     [facility] Telephone:([phone]   Fax:(336) [phone]  OFFICE PROGRESS NOTE  Rodrigo Ran, MD [address] [place] [address]  DIAGNOSIS: Stage IIIA (T1b, N2, M0) non-small cell lung cancer presented with right upper lobe lung nodule in addition to mediastinal lymphadenopathy diagnosed in January 2021.  PRIOR THERAPY:   1) Concurrent chemoradiation with weekly carboplatin for AUC of 2 and paclitaxel 45 NG/M2.  Status post 5 cycles. Last dose received on 05/30/19. 2) Consolidation immunotherapy with Imfinzi 1500 mg/kg IV every 4 weeks.  First dose expected on 07/21/2019. Status post 13 cycles. 3) SBRT to the large right upper lobe pulmonary nodules under the care of Dr. Mitzi Hansen completed October 18, 2021.   CURRENT THERAPY: Observation.  INTERVAL HISTORY: Betty Anderson 67 y.o. female returns to the clinic today for follow-up visit accompanied by her husband.  The patient continues to complain of pain in the right shoulder as well as shortness of breath.  She also has cough.  She continues to smoke at regular basis.  She denied having any hemoptysis.  She has no nausea, vomiting, diarrhea or constipation.  She has no headache or visual changes.  She denied having any fever or chills.  She has no weight loss or night sweats.  She had repeat CT scan of the chest performed several days ago but unfortunately the report is still pending and she is here for evaluation and discussion of her condition.  MEDICAL HISTORY: Past Medical History:  Diagnosis Date   COPD (chronic obstructive pulmonary disease) (HCC)    emphysema   Hyperlipidemia    Hypertension    lung ca dx'd 03/2019   Lung nodule    Paroxysmal atrial flutter (HCC)    per cardiology note   Peripheral artery disease (HCC)    Tobacco abuse    Vitamin D deficiency    Wears glasses     ALLERGIES:  is allergic to lisinopril.  MEDICATIONS:  Current Outpatient Medications  Medication Sig Dispense  Refill   aspirin EC 81 MG tablet Take 1 tablet (81 mg total) by mouth daily. 90 tablet 3   baclofen (LIORESAL) 10 MG tablet Take 1 tablet (10 mg total) by mouth 2 (two) times daily. 45 each 0   Cholecalciferol (DIALYVITE VITAMIN D 5000) 125 MCG (5000 UT) capsule Take 5,000 Units by mouth daily.     escitalopram (LEXAPRO) 10 MG tablet Take 10 mg by mouth daily.     guaiFENesin (MUCINEX) 600 MG 12 hr tablet Take by mouth 2 (two) times daily.     HYDROcodone-acetaminophen (NORCO) 10-325 MG tablet Take 1 tablet by mouth every 6 (six) hours as needed. 45 tablet 0   meloxicam (MOBIC) 7.5 MG tablet Take 1 tablet (7.5 mg total) by mouth daily. 30 tablet 0   pantoprazole (PROTONIX) 40 MG tablet TAKE ONE TABLET BY MOUTH TWICE A DAY 180 tablet 2   rosuvastatin (CRESTOR) 20 MG tablet Take 20 mg by mouth every evening.      Tiotropium Bromide-Olodaterol (STIOLTO RESPIMAT) 2.5-2.5 MCG/ACT AERS Inhale 2 puffs into the lungs daily. 4 g 0   vitamin B-12 (CYANOCOBALAMIN) 1000 MCG tablet Take 1,000 mcg by mouth daily.     No current facility-administered medications for this visit.    SURGICAL HISTORY:  Past Surgical History:  Procedure Laterality Date   BRONCHIAL NEEDLE ASPIRATION BIOPSY  04/12/2019   Procedure: BRONCHIAL NEEDLE ASPIRATION BIOPSIES;  Surgeon: Josephine Igo, DO;  Location: [facility];  Service: Cardiopulmonary;;   ENDOBRONCHIAL ULTRASOUND N/A 04/12/2019   Procedure: ENDOBRONCHIAL ULTRASOUND;  Surgeon: Josephine Igo, DO;  Location: [facility];  Service: Cardiopulmonary;  Laterality: N/A;   OVARIAN CYST REMOVAL     TONSILLECTOMY     TUBAL LIGATION     VIDEO BRONCHOSCOPY N/A 04/12/2019   Procedure: VIDEO BRONCHOSCOPY WITHOUT FLUORO;  Surgeon: Josephine Igo, DO;  Location: [facility]OS[facility];  Service: Cardiopulmonary;  Laterality: N/A;    REVIEW OF SYSTEMS:  Constitutional: positive for fatigue Eyes: negative Ears, nose, mouth, throat, and face: negative Respiratory: positive for  cough and dyspnea on exertion Cardiovascular: negative Gastrointestinal: negative Genitourinary:negative Integument/breast: negative Hematologic/lymphatic: negative Musculoskeletal:positive for arthralgias Neurological: negative Behavioral/Psych: negative Endocrine: negative Allergic/Immunologic: negative   PHYSICAL EXAMINATION: General appearance: alert, cooperative, fatigued, and no distress Head: Normocephalic, without obvious abnormality, atraumatic Neck: no adenopathy, no JVD, supple, symmetrical, trachea midline, and thyroid not enlarged, symmetric, no tenderness/mass/nodules Lymph nodes: Cervical, supraclavicular, and axillary nodes normal. Resp: clear to auscultation bilaterally Back: symmetric, no curvature. ROM normal. No CVA tenderness. Cardio: regular rate and rhythm, S1, S2 normal, no murmur, click, rub or gallop GI: soft, non-tender; bowel sounds normal; no masses,  no organomegaly Extremities: extremities normal, atraumatic, no cyanosis or edema Neurologic: Alert and oriented X 3, normal strength and tone. Normal symmetric reflexes. Normal coordination and gait  ECOG PERFORMANCE STATUS: 1 - Symptomatic but completely ambulatory  Blood pressure (!) 102/55, pulse 73, temperature 97.7 F (36.5 C), temperature source Oral, resp. rate 16, weight 163 lb 8 oz (74.2 kg), SpO2 99 %.  LABORATORY DATA: Lab Results  Component Value Date   WBC 5.1 09/01/2022   HGB 11.6 (L) 09/01/2022   HCT 34.8 (L) 09/01/2022   MCV 95.1 09/01/2022   PLT 260 09/01/2022      Chemistry      Component Value Date/Time   NA 137 09/01/2022 0908   K 4.2 09/01/2022 0908   CL 109 09/01/2022 0908   CO2 23 09/01/2022 0908   BUN 22 09/01/2022 0908   CREATININE 1.58 (H) 09/01/2022 0908      Component Value Date/Time   CALCIUM 9.2 09/01/2022 0908   ALKPHOS 68 09/01/2022 0908   AST 11 (L) 09/01/2022 0908   ALT 6 09/01/2022 0908   BILITOT 0.3 09/01/2022 0908       RADIOGRAPHIC STUDIES: No  results found.   ASSESSMENT AND PLAN: This is a very pleasant 67 years old white female with stage IIIA non-small cell lung cancer, presented with right upper lobe lung nodule and mediastinal lymphadenopathy diagnosed in January 2021.   She completed a course of concurrent chemoradiation with weekly carboplatin and paclitaxel for 5 cycles and tolerated her treatment well.  She has partial response. The patient underwent consolidation treatment with Imfinzi 1500 mg IV every 4 weeks status post 13 cycles.  She tolerated this treatment well with no concerning adverse effects. She also underwent SBRT to enlarging right upper lobe pulmonary nodules under the care of Dr. Mitzi Hansen. Her last CT scan of the chest on Aug 22, 2021 showed no concerning findings for disease progression except for enlargement of the nodular area along the pleural surface in the right upper lobe concerning for disease recurrence this was followed by a PET scan on 09/05/2021 and that showed increased radiotracer uptake in the nodular density within the right upper lobe concerning for new site of metabolically active tumor.  The patient underwent curative radiotherapy to this lesion with SBRT under the care of  Dr. Mitzi Hansen completed on October 18, 2021. The patient is currently on observation.  She was noted on the previous CT scan of the chest to have worsening opacity in the right lung.  She had repeat CT scan of the chest performed few days ago but fortunately the final report is still pending.  I personally and independently reviewed the scan images in comparison to the previous scan and I can see some increase in the density in the right lung in addition to increase in the size of other pulmonary nodules especially in the right lung. This finding are concerning for disease progression. I recommended for the patient to have a PET scan for further evaluation of her disease and to rule out other metastatic lesions. I will see her back for  follow-up visit in 3 weeks for evaluation and discussion of her PET scan results and treatment options. She was advised to call immediately if she has any other concerning symptoms in the interval. The patient voices understanding of current disease status and treatment options and is in agreement with the current care plan.  All questions were answered. The patient knows to call the clinic with any problems, questions or concerns. We can certainly see the patient much sooner if necessary.  The total time spent in the appointment was 30 minutes.  Disclaimer: This note was dictated with voice recognition software. Similar sounding words can inadvertently be transcribed and may not be corrected upon review.

## 2022-09-11 ENCOUNTER — Other Ambulatory Visit: Payer: Self-pay

## 2022-09-11 DIAGNOSIS — C3411 Malignant neoplasm of upper lobe, right bronchus or lung: Secondary | ICD-10-CM

## 2022-09-11 DIAGNOSIS — Z515 Encounter for palliative care: Secondary | ICD-10-CM

## 2022-09-11 DIAGNOSIS — G893 Neoplasm related pain (acute) (chronic): Secondary | ICD-10-CM

## 2022-09-11 MED ORDER — HYDROCODONE-ACETAMINOPHEN 10-325 MG PO TABS: 1.0000 | ORAL_TABLET | Freq: Four times a day (QID) | ORAL | 0 refills | Status: DC | PRN

## 2022-09-11 NOTE — Telephone Encounter (Signed)
Pt call for med refill, see new orders

## 2022-09-16 ENCOUNTER — Other Ambulatory Visit: Payer: Self-pay

## 2022-09-16 DIAGNOSIS — Z515 Encounter for palliative care: Secondary | ICD-10-CM

## 2022-09-16 DIAGNOSIS — G893 Neoplasm related pain (acute) (chronic): Secondary | ICD-10-CM

## 2022-09-16 DIAGNOSIS — C3411 Malignant neoplasm of upper lobe, right bronchus or lung: Secondary | ICD-10-CM

## 2022-09-16 MED ORDER — BACLOFEN 10 MG PO TABS: 10.0000 mg | ORAL_TABLET | Freq: Two times a day (BID) | ORAL | 0 refills | Status: DC

## 2022-09-16 NOTE — Telephone Encounter (Signed)
Pt called for medication refill, see new order.

## 2022-09-22 ENCOUNTER — Encounter (HOSPITAL_COMMUNITY): Payer: Self-pay

## 2022-09-22 ENCOUNTER — Other Ambulatory Visit: Payer: Self-pay

## 2022-09-22 ENCOUNTER — Emergency Department (HOSPITAL_COMMUNITY)
Admission: EM | Admit: 2022-09-22 | Discharge: 2022-09-23 | Disposition: A | Discharge status: Home / Self Care | Payer: Medicare Other | Attending: Emergency Medicine | Admitting: Emergency Medicine

## 2022-09-22 ENCOUNTER — Emergency Department (HOSPITAL_COMMUNITY): Payer: Medicare Other

## 2022-09-22 DIAGNOSIS — I10 Essential (primary) hypertension: Secondary | ICD-10-CM | POA: Insufficient documentation

## 2022-09-22 DIAGNOSIS — I2510 Atherosclerotic heart disease of native coronary artery without angina pectoris: Secondary | ICD-10-CM | POA: Insufficient documentation

## 2022-09-22 DIAGNOSIS — R1032 Left lower quadrant pain: Secondary | ICD-10-CM | POA: Diagnosis present

## 2022-09-22 DIAGNOSIS — N859 Noninflammatory disorder of uterus, unspecified: Secondary | ICD-10-CM | POA: Insufficient documentation

## 2022-09-22 DIAGNOSIS — Z85118 Personal history of other malignant neoplasm of bronchus and lung: Secondary | ICD-10-CM | POA: Insufficient documentation

## 2022-09-22 DIAGNOSIS — R1030 Lower abdominal pain, unspecified: Secondary | ICD-10-CM | POA: Insufficient documentation

## 2022-09-22 DIAGNOSIS — N858 Other specified noninflammatory disorders of uterus: Secondary | ICD-10-CM

## 2022-09-22 DIAGNOSIS — J449 Chronic obstructive pulmonary disease, unspecified: Secondary | ICD-10-CM | POA: Diagnosis not present

## 2022-09-22 DIAGNOSIS — Z7982 Long term (current) use of aspirin: Secondary | ICD-10-CM | POA: Diagnosis not present

## 2022-09-22 LAB — CBC
HCT: 40.9 % (ref 36.0–46.0)
Hemoglobin: 13.4 g/dL (ref 12.0–15.0)
MCH: 31 pg (ref 26.0–34.0)
MCHC: 32.8 g/dL (ref 30.0–36.0)
MCV: 94.7 fL (ref 80.0–100.0)
Platelets: 304 10*3/uL (ref 150–400)
RBC: 4.32 MIL/uL (ref 3.87–5.11)
RDW: 14.1 % (ref 11.5–15.5)
WBC: 8.8 10*3/uL (ref 4.0–10.5)
nRBC: 0 % (ref 0.0–0.2)

## 2022-09-22 LAB — BASIC METABOLIC PANEL
Anion gap: 12 (ref 5–15)
BUN: 16 mg/dL (ref 8–23)
CO2: 19 mmol/L — ABNORMAL LOW (ref 22–32)
Calcium: 9.3 mg/dL (ref 8.9–10.3)
Chloride: 103 mmol/L (ref 98–111)
Creatinine, Ser: 1.21 mg/dL — ABNORMAL HIGH (ref 0.44–1.00)
GFR, Estimated: 49 mL/min — ABNORMAL LOW (ref >60)
Glucose, Bld: 130 mg/dL — ABNORMAL HIGH (ref 70–99)
Potassium: 3.7 mmol/L (ref 3.5–5.1)
Sodium: 134 mmol/L — ABNORMAL LOW (ref 135–145)

## 2022-09-22 NOTE — ED Triage Notes (Signed)
Patient reports lower abd pain radiating to lower back, states pain has been worsening over the last couple days, endorses difficulty with urinating and n/v.

## 2022-09-23 ENCOUNTER — Encounter (HOSPITAL_COMMUNITY): Payer: Self-pay

## 2022-09-23 ENCOUNTER — Emergency Department (HOSPITAL_COMMUNITY): Payer: Medicare Other

## 2022-09-23 LAB — URINALYSIS, ROUTINE W REFLEX MICROSCOPIC
Bilirubin Urine: NEGATIVE
Glucose, UA: NEGATIVE mg/dL
Ketones, ur: 20 mg/dL — AB
Leukocytes,Ua: NEGATIVE
Nitrite: NEGATIVE
Protein, ur: >=300 mg/dL — AB
Specific Gravity, Urine: 1.026 (ref 1.005–1.030)
pH: 6 (ref 5.0–8.0)

## 2022-09-23 LAB — HEPATIC FUNCTION PANEL
ALT: 12 U/L (ref 0–44)
AST: 15 U/L (ref 15–41)
Albumin: 4 g/dL (ref 3.5–5.0)
Alkaline Phosphatase: 78 U/L (ref 38–126)
Bilirubin, Direct: <0.1 mg/dL (ref 0.0–0.2)
Total Bilirubin: 0.6 mg/dL (ref 0.3–1.2)
Total Protein: 8.6 g/dL — ABNORMAL HIGH (ref 6.5–8.1)

## 2022-09-23 LAB — LIPASE, BLOOD: Lipase: 31 U/L (ref 11–51)

## 2022-09-23 LAB — LACTIC ACID, PLASMA
Lactic Acid, Venous: 1.3 mmol/L (ref 0.5–1.9)
Lactic Acid, Venous: 0.8 mmol/L (ref 0.5–1.9)

## 2022-09-23 MED ORDER — ONDANSETRON HCL 4 MG/2ML IJ SOLN
4.0000 mg | Freq: Once | INTRAMUSCULAR | Status: AC
  Administered 2022-09-23: 4 mg via INTRAVENOUS
  Filled 2022-09-23: qty 2

## 2022-09-23 MED ORDER — LACTATED RINGERS IV BOLUS
1000.0000 mL | Freq: Once | INTRAVENOUS | Status: AC
  Administered 2022-09-23: 1000 mL via INTRAVENOUS

## 2022-09-23 MED ORDER — SODIUM CHLORIDE (PF) 0.9 % IJ SOLN
INTRAMUSCULAR | Status: AC
  Filled 2022-09-23: qty 50

## 2022-09-23 MED ORDER — ONDANSETRON 4 MG PO TBDP: 4.0000 mg | ORAL_TABLET | Freq: Three times a day (TID) | ORAL | 0 refills | Status: AC | PRN

## 2022-09-23 MED ORDER — IOHEXOL 350 MG/ML SOLN
75.0000 mL | Freq: Once | INTRAVENOUS | Status: AC | PRN
  Administered 2022-09-23: 75 mL via INTRAVENOUS

## 2022-09-23 MED ORDER — HYDROMORPHONE HCL 1 MG/ML IJ SOLN
1.0000 mg | Freq: Once | INTRAMUSCULAR | Status: AC
  Administered 2022-09-23: 1 mg via INTRAVENOUS
  Filled 2022-09-23: qty 1

## 2022-09-23 NOTE — Progress Notes (Signed)
[facility] OFFICE PROGRESS NOTE  Rodrigo Ran, MD [address] [place] [address]  DIAGNOSIS: Recurrent lung cancer initially diagnosed as stage IIIA (T1b, N2, M0) non-small cell lung cancer presented with right upper lobe lung nodule in addition to mediastinal lymphadenopathy diagnosed in January 2021.  She had some new lung nodules in the right upper lobe in June 2024  PRIOR THERAPY: 1) Concurrent chemoradiation with weekly carboplatin for AUC of 2 and paclitaxel 45 NG/M2.  Status post 5 cycles. Last dose received on 05/30/19. 2) Consolidation immunotherapy with Imfinzi 1500 mg/kg IV every 4 weeks.  First dose expected on 07/21/2019. Status post 13 cycles. 3) SBRT to the large right upper lobe pulmonary nodules under the care of Dr. Mitzi Hansen completed October 18, 2021.  CURRENT THERAPY: Palliative systemic chemotherapy with carboplatin for an AUC of 5, paclitaxel 175 mg/m, and Keytruda 200 mg IV every 3 weeks.  First dose on 10/09/2022  INTERVAL HISTORY: Aymee Jeansonne 67 y.o. female returns to clinic today for follow-up visit accompanied by her husband. The patient was seen earlier this month on 09/04/2022 by Dr. Arbutus Ped.  The patient is currently on observation for her history of stage IIIa non-small cell lung cancer.  At the time of her last appointment, she had a restaging CT scan at that time which showed increased density in the right lung and increased size of pulmonary nodules especially in the right lung which is concerning for disease progression.  Therefore Dr. Arbutus Ped recommended a PET scan to rule out metastatic lesions.  Today, the patient has significant hypotension via manual recheck and BP machine x2. She denies taking any blood pressure medications.  She did take a muscle relaxer earlier today and has not eaten today.  She denies any significant lightheadedness or dizziness. Dr. Arbutus Ped also saw the patient today. Dr. Asa Lente recommendation to go to the emergency  room due to her significant hypotension.  The patient initially refused but then was agreeable.  However she did mention that if she does not get a bed immediately "I will leave".   Since last being seen, she denies any major changes in her health except she did present to the emergency room on 09/23/2022 for the chief complaint of abdominal pain and vomiting in which case she had a CT scan that showed fibroid of the uterus versus uterine malignancy she also had a CTA to rule out ischemic process in the bowel which was negative.  He was discharged with a prescription for Zofran.   Since last being seen denies any fevers, chills, or night sweats.  She did mention she gets hot flashes.  She also states that she is lost 10 pounds in a week.  She reports similar baseline dyspnea on exertion.  Denies any cough, chest pain, or hemoptysis.  She has not had any nausea or vomiting since she left the emergency room about a week ago.  She struggles with constipation at baseline.  She takes stool softener but does not take laxatives because they "tear her up".  Denies any headache or visual changes.  She is here today to review her PET scan and more detailed discussion about her current condition and recommended treatment plan.  MEDICAL HISTORY: Past Medical History:  Diagnosis Date   COPD (chronic obstructive pulmonary disease) (HCC)    emphysema   Hyperlipidemia    Hypertension    lung ca dx'd 03/2019   Lung nodule    Paroxysmal atrial flutter (HCC)  per cardiology note   Peripheral artery disease (HCC)    Tobacco abuse    Vitamin D deficiency    Wears glasses     ALLERGIES:  is allergic to lisinopril.  MEDICATIONS:  Current Outpatient Medications  Medication Sig Dispense Refill   aspirin EC 81 MG tablet Take 1 tablet (81 mg total) by mouth daily. 90 tablet 3   baclofen (LIORESAL) 10 MG tablet Take 1 tablet (10 mg total) by mouth 2 (two) times daily. 45 each 0   Cholecalciferol (DIALYVITE  VITAMIN D 5000) 125 MCG (5000 UT) capsule Take 5,000 Units by mouth daily.     escitalopram (LEXAPRO) 10 MG tablet Take 10 mg by mouth daily.     guaiFENesin (MUCINEX) 600 MG 12 hr tablet Take by mouth 2 (two) times daily.     HYDROcodone-acetaminophen (NORCO) 10-325 MG tablet Take 1 tablet by mouth every 6 (six) hours as needed. 60 tablet 0   meloxicam (MOBIC) 15 MG tablet (PER PALL CARE) 1 tablet Orally Once a day     ondansetron (ZOFRAN-ODT) 4 MG disintegrating tablet Take 1 tablet (4 mg total) by mouth every 8 (eight) hours as needed for nausea or vomiting. 20 tablet 0   pantoprazole (PROTONIX) 40 MG tablet TAKE ONE TABLET BY MOUTH TWICE A DAY 180 tablet 2   rosuvastatin (CRESTOR) 20 MG tablet Take 20 mg by mouth every evening.      Tiotropium Bromide-Olodaterol (STIOLTO RESPIMAT) 2.5-2.5 MCG/ACT AERS Inhale 2 puffs into the lungs daily. 4 g 0   vitamin B-12 (CYANOCOBALAMIN) 1000 MCG tablet Take 1,000 mcg by mouth daily.     No current facility-administered medications for this visit.    SURGICAL HISTORY:  Past Surgical History:  Procedure Laterality Date   BRONCHIAL NEEDLE ASPIRATION BIOPSY  04/12/2019   Procedure: BRONCHIAL NEEDLE ASPIRATION BIOPSIES;  Surgeon: Josephine Igo, DO;  Location: [facility];  Service: Cardiopulmonary;;   ENDOBRONCHIAL ULTRASOUND N/A 04/12/2019   Procedure: ENDOBRONCHIAL ULTRASOUND;  Surgeon: Josephine Igo, DO;  Location: [facility];  Service: Cardiopulmonary;  Laterality: N/A;   OVARIAN CYST REMOVAL     TONSILLECTOMY     TUBAL LIGATION     VIDEO BRONCHOSCOPY N/A 04/12/2019   Procedure: VIDEO BRONCHOSCOPY WITHOUT FLUORO;  Surgeon: Josephine Igo, DO;  Location: [facility];  Service: Cardiopulmonary;  Laterality: N/A;    REVIEW OF SYSTEMS:   Review of Systems  Constitutional: Positive for fatigue. Negative for appetite change, chills, fever and unexpected weight change.  HENT: Positive for trouble swallowing. Negative for mouth sores,  nosebleeds, and sore throat.  Eyes: Negative for eye problems and icterus.  Respiratory: Positive for cough and dyspnea on exertion. Negative for hemoptysis, and wheezing.   Cardiovascular: Negative for chest pain and leg swelling.  Gastrointestinal: Positive constipation.  Negative for diarrhea, nausea and vomiting.  Genitourinary: Negative for bladder incontinence, difficulty urinating, dysuria, frequency and hematuria.   Musculoskeletal: Negative for back pain, gait problem, neck pain and neck stiffness.  Skin: Negative for itching and rash.  Neurological: Negative for dizziness, extremity weakness, gait problem, headaches, light-headedness and seizures.  Hematological: Negative for adenopathy. Does not bruise/bleed easily.  Psychiatric/Behavioral: Negative for confusion, depression and sleep disturbance. The patient is not nervous/anxious.     PHYSICAL EXAMINATION:  Blood pressure (!) 64/58, pulse 87, resp. rate 18, height 5\' 3"  (1.6 m), weight 155 lb 14.4 oz (70.7 kg), SpO2 99 %.  ECOG PERFORMANCE STATUS: 2  Physical Exam  Constitutional: Oriented to person,  place, and time and chronic appearing female and in no distress.  HENT:  Head: Normocephalic and atraumatic.  Mouth/Throat: Oropharynx is clear and moist. No oropharyngeal exudate.  Eyes: Conjunctivae are normal. Right eye exhibits no discharge. Left eye exhibits no discharge. No scleral icterus.  Neck: Normal range of motion. Neck supple.  Cardiovascular: Normal rate, regular rhythm, quiet systolic murmur in the left 4th intercostal space. intact distal pulses.   Pulmonary/Chest: Effort normal and breath sounds normal. No respiratory distress. No wheezes. No rales.  Abdominal: Soft. Bowel sounds are normal. Exhibits no distension and no mass. There is no tenderness.  Musculoskeletal: No focal tenderness to palpation in her back. Normal range of motion. Exhibits no edema.  Lymphadenopathy:    No cervical adenopathy.   Neurological: Alert and oriented to person, place, and time. Exhibits normal muscle tone. Gait normal. Coordination normal.  Skin: Skin is warm and dry. No rash noted. Not diaphoretic. No erythema. No pallor.  Psychiatric: Mood, memory and judgment normal.  Vitals reviewed.    LABORATORY DATA: Lab Results  Component Value Date   WBC 8.8 09/22/2022   HGB 13.4 09/22/2022   HCT 40.9 09/22/2022   MCV 94.7 09/22/2022   PLT 304 09/22/2022      Chemistry      Component Value Date/Time   NA 134 (L) 09/22/2022 2135   K 3.7 09/22/2022 2135   CL 103 09/22/2022 2135   CO2 19 (L) 09/22/2022 2135   BUN 16 09/22/2022 2135   CREATININE 1.21 (H) 09/22/2022 2135   CREATININE 1.58 (H) 09/01/2022 0908      Component Value Date/Time   CALCIUM 9.3 09/22/2022 2135   ALKPHOS 78 09/23/2022 0351   AST 15 09/23/2022 0351   AST 11 (L) 09/01/2022 0908   ALT 12 09/23/2022 0351   ALT 6 09/01/2022 0908   BILITOT 0.6 09/23/2022 0351   BILITOT 0.3 09/01/2022 0908       RADIOGRAPHIC STUDIES:  NM PET Image Restage (PS) Skull Base to Thigh (F-18 FDG)  Result Date: 09/29/2022 CLINICAL DATA:  Subsequent treatment strategy for non-small cell lung cancer. EXAM: NUCLEAR MEDICINE PET SKULL BASE TO THIGH TECHNIQUE: 7.92 mCi F-18 FDG was injected intravenously. Full-ring PET imaging was performed from the skull base to thigh after the radiotracer. CT data was obtained and used for attenuation correction and anatomic localization. Fasting blood glucose: 106 mg/dl COMPARISON:  CT from 16/12/9602 and PET-CT from 09/05/2021 FINDINGS: Mediastinal blood pool activity: SUV max 2.98 Liver activity: SUV max NA NECK: No hypermetabolic lymph nodes in the neck. Incidental CT findings: None CHEST: No tracer avid mediastinal, hilar, axillary, or supraclavicular lymph nodes. Within the area of radiation change involving the right upper lobe there is a focal area of increased uptake identified medially which measures 1.5 x 1.2 with  SUV max of 10.78, image 48/4. Within the anterior right upper lobe there is a small focus increased radiotracer uptake with SUV max of 4.97. This corresponds to a subpleural non solid nodular density measuring 1.2 by 0.9 cm, image 50/4. 4 mm nodule in the superior segment of the right lower lobe is stable, image 21/7. This is too small to characterize by PET-CT. Stable faint nodule in the superior segment of left lower lobe measuring 4 mm, image 26/7. Unchanged from previous exam. Incidental CT findings: Emphysema. Aortic atherosclerosis and coronary artery calcifications. ABDOMEN/PELVIS: No abnormal hypermetabolic activity within the liver, pancreas, adrenal glands, or spleen. No hypermetabolic lymph nodes in the abdomen or pelvis.  Incidental CT findings: Aortic atherosclerotic calcifications. Diffusely enlarged uterus is again noted without internal increased uptake. This measures 13.7 x 10.2 cm, image 147/4. Formally this measured 14.7 by 8.2 cm. SKELETON: No focal hypermetabolic activity to suggest skeletal metastasis. Incidental CT findings: None. IMPRESSION: 1. There is a focal area of increased radiotracer uptake identified within the medial aspect of the radiation change involving the right upper lobe. SUV max is equal to 10.78. This is a new finding when compared with the prior PET-CT from 06/08/202. This is moderately suspicious for locally recurrent tumor. 2. There is a smaller focus of increased radiotracer uptake identified within the anterior right upper lobe corresponding to a non solid nodular density measuring 1.2 by 0.9 cm. This is nonspecific and could represent an area of inflammation. 3. Unchanged tiny nodules within the superior segment right lower lobe and superior segment of left lower lobe. These are too small to characterize by PET-CT but appear unchanged when compared with 09/01/2022. 4. No evidence of metastatic disease within the abdomen or pelvis. 5. Diffusely enlarged uterus is again  noted without internal increased uptake. This measures 13.7 x 10.2 cm. Formally this measured 14.7 by 8.2 cm. Consider correlation with pelvic sonogram. 6. Aortic Atherosclerosis (ICD10-I70.0) and Emphysema (ICD10-J43.9). Electronically Signed   By: Signa Kell M.D.   On: 09/29/2022 10:13   CT Angio Abd/Pel W and/or Wo Contrast  Result Date: 09/23/2022 CLINICAL DATA:  67 year old female with lower abdominal pain radiating to the back, increasing over the last couple of days. History of non-small cell lung cancer. EXAM: CTA ABDOMEN AND PELVIS WITHOUT AND WITH CONTRAST TECHNIQUE: Multidetector CT imaging of the abdomen and pelvis was performed using the standard protocol during bolus administration of intravenous contrast. Multiplanar reconstructed images and MIPs were obtained and reviewed to evaluate the vascular anatomy. RADIATION DOSE REDUCTION: This exam was performed according to the departmental dose-optimization program which includes automated exposure control, adjustment of the mA and/or kV according to patient size and/or use of iterative reconstruction technique. CONTRAST:  75mL OMNIPAQUE IOHEXOL 350 MG/ML SOLN COMPARISON:  Noncontrast CT Abdomen and Pelvis yesterday. PET-CT 09/05/2021. FINDINGS: VASCULAR Extensive Aortoiliac calcified atherosclerosis. 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 SMA atherosclerosis is comparatively mild. IMA remains patent. Atherosclerotic bilateral iliac and proximal femoral arteries remain patent, but there is moderate to severe bilateral superficial femoral artery stenosis (series 4, image 203 on the right). On the delayed phase images the portal venous system is patent. Review of the MIP images confirms the above findings. NON-VASCULAR Lower chest: Improved lung volumes but otherwise stable 2 PET-CT last year. No cardiomegaly, pericardial effusion, pleural effusion. Hepatobiliary:  Small volume perihepatic simple density free fluid yesterday does not persist. Liver and gallbladder appear negative. Pancreas: Negative. Spleen: Negative. Adrenals/Urinary Tract: Adrenal glands are stable from the PET-CT last year, within normal limits. Chronic renal cortical scarring more pronounced on the right. Nonobstructed kidneys with symmetric renal enhancement. Renal vascular calcifications. Nephrolithiasis difficult to exclude. Exophytic left renal cyst was benign by PET-CT last year (no follow-up imaging recommended). Mildly to moderately distended urinary bladder with mild mass effect from the uterus, but otherwise unremarkable. Incidental pelvic phleboliths. Stomach/Bowel: No dilated large or small bowel. Redundant right colon with cecum on a lax mesentery. Diminutive or absent appendix. No discrete bowel inflammation. Retained fluid in the stomach. Duodenum is decompressed. No free air. No free fluid identified in the abdomen now. Lymphatic:  No lymphadenopathy identified. Reproductive: Heterogeneous large and lobulated uterus redemonstrated, with uterine fundal enlargement up to 15 cm transverse significantly progressed from PET-CT last year. Benign FDG appearance of the uterus at that time (multiple fibroids). No adnexal enlargement is evident. Other: Small volume of pelvis free fluid, in the cul-de-sac with simple fluid density series 18, image 69. This is unchanged from yesterday, new or increased from the PET-CT last year. Musculoskeletal: No acute or suspicious osseous lesion identified. IMPRESSION: 1. Severe Aortic Atherosclerosis (ICD10-I70.0), but mesenteric vasculature remains patent. 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 Bilateral proximal femoral artery atherosclerotic stenosis. 2. Abnormal but nonspecific pelvis free fluid is stable from yesterday, with no bowel obstruction or bowel inflammation evident by CT. See also #3. 3. Progressive uterine enlargement and  heterogeneity, especially the uterine fundus, from PET-CT last year. As stated yesterday it is difficult to exclude malignant transformation of uterine fibroid disease. Recommend GYN Surgery consultation. 4. No other acute or inflammatory process identified in the abdomen or pelvis. Electronically Signed   By: Odessa Fleming M.D.   On: 09/23/2022 05:16   CT Renal Stone Study  Result Date: 09/22/2022 CLINICAL DATA:  Abdominal/flank pain, stone suspected. EXAM: CT ABDOMEN AND PELVIS WITHOUT CONTRAST TECHNIQUE: Multidetector CT imaging of the abdomen and pelvis was performed following the standard protocol without IV contrast. RADIATION DOSE REDUCTION: This exam was performed according to the departmental dose-optimization program which includes automated exposure control, adjustment of the mA and/or kV according to patient size and/or use of iterative reconstruction technique. COMPARISON:  None Available. FINDINGS: Lower chest: No acute abnormality. Hepatobiliary: No focal liver abnormality is seen. No gallstones, gallbladder wall thickening, or biliary dilatation. Pancreas: Unremarkable. No pancreatic ductal dilatation or surrounding inflammatory changes. Spleen: Normal in size without focal abnormality. Adrenals/Urinary Tract: Adrenal glands are unremarkable. 2 mm nonobstructing calculus in the interpolar region and another 4 mm calculus in the anterior aspect of the lower pole of the right kidney. Simple cysts in the lower pole of the left kidney. There are multiple 2-3 mm nonobstructing calculi in the left kidney. No evidence of hydronephrosis or ureteral calculus. Bladder is unremarkable. Stomach/Bowel: Stomach is within normal limits. Appendix appears normal. No evidence of bowel wall thickening, distention, or inflammatory changes. Vascular/Lymphatic: Aortic atherosclerosis. No enlarged abdominal or pelvic lymph nodes. Reproductive: There is a large heterogeneous uterus measuring at least 9.3 x 13.8 x 13.7 cm.  This may represent a large multi fibroid uterus or a uterine carcinoma. Other: No abdominal wall hernia or abnormality. No abdominopelvic ascites. 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 No suspicious lesion or acute osseous abnormality Musculoskeletal: Multilevel degenerate disc disease of the lumbar spine. No acute osseous abnormality. IMPRESSION: 1. Large heterogeneous uterus measuring at least 9.3 x 13.8 x 13.7 cm, which may represent a large multi fibroid uterus or a uterine carcinoma. OBGYN consultation and further evaluation with MR examination with and without contrast is recommended. 2. Bilateral nonobstructing renal calculi measuring up to 4 mm in the lower pole of the right kidney and 2 mm in the interpolar region of the right kidney. No evidence of hydronephrosis or ureteral calculus. 3. Normal appendix. No evidence of colitis or diverticulitis. 4. Multilevel degenerate disc disease of the lumbar spine. No acute osseous abnormality. Aortic Atherosclerosis (ICD10-I70.0). Electronically Signed   By: Larose Hires D.O.   On: 09/22/2022 23:41   CT Chest W Contrast  Result Date: 09/05/2022 CLINICAL DATA:  Stage IIIA right upper lobe non-small cell  lung cancer status post concurrent chemoradiation and consolidation immunotherapy with radiation therapy to right upper lobe pulmonary nodules completed 10/18/2021. Interval observation. Restaging. * Tracking Code: BO * EXAM: CT CHEST WITH CONTRAST TECHNIQUE: Multidetector CT imaging of the chest was performed during intravenous contrast administration. RADIATION DOSE REDUCTION: This exam was performed according to the departmental dose-optimization program which includes automated exposure control, adjustment of the mA and/or kV according to patient size and/or use of iterative reconstruction technique. CONTRAST:  60mL OMNIPAQUE IOHEXOL 300 MG/ML  SOLN COMPARISON:  06/27/2022 chest CT. FINDINGS: Cardiovascular: Normal heart size. No significant  pericardial effusion/thickening. Left anterior descending and left circumflex coronary atherosclerosis. Atherosclerotic nonaneurysmal thoracic aorta. Normal caliber pulmonary arteries. No central pulmonary emboli. Mediastinum/Nodes: No significant thyroid nodules. Unremarkable esophagus. No pathologically enlarged axillary, mediastinal or hilar lymph nodes. Lungs/Pleura: No pneumothorax. No pleural effusion. Treated posterior peripheral apical right upper lobe indistinct 1.0 x 0.7 cm solid pulmonary nodule (series 6/image 25), previously 1.1 x 0.9 cm, slightly decreased. Patchy somewhat sharply marginated consolidation, reticulation and ground-glass opacity in the apical right upper lobe with associated volume loss and distortion, slightly increased in consolidation, favor evolving postradiation change. New solid round 0.5 cm pulmonary nodule in the peripheral aspect of the superior segment right lower lobe (series 6/image 48). New anterior left lower lobe solid 0.3 cm pulmonary nodule (series 6/image 64). No appreciable change in background moderate patchy ground-glass opacity and reticulation throughout both lungs with a basilar predominance, without significant regions of traction bronchiectasis or frank honeycombing. Upper abdomen: Exophytic simple 1.9 cm lateral interpolar left renal cyst and partially visualized simple 1.4 cm posterior lower right renal cyst, for which no follow-up imaging is recommended. Musculoskeletal: No aggressive appearing focal osseous lesions. Moderate thoracic spondylosis. IMPRESSION: 1. New solid 0.5 cm superior segment right lower lobe and 0.3 cm anterior left lower lobe pulmonary nodules, concerning for new pulmonary metastases, below PET resolution. Suggest attention on short-term follow-up chest CT in 3 months. 2. Treated posterior peripheral apical right upper lobe indistinct 1.0 cm solid pulmonary nodule, slightly decreased in size. Continued expected evolution of postradiation  change in the right upper lobe. 3. No thoracic adenopathy. 4. Two-vessel coronary atherosclerosis. 5.  Aortic Atherosclerosis (ICD10-I70.0). These results will be called to the ordering clinician or representative by the Radiologist Assistant, and communication documented in the PACS or [employer]. Electronically Signed   By: Delbert Phenix M.D.   On: 09/05/2022 17:42     ASSESSMENT/PLAN:  This is a very pleasant 67 year old Caucasian female with recurrent non-small cell lung cancer initially diagnosed as stage IIIa non-small cell lung cancer who presented with a right upper lobe lung nodule and mediastinal lymphadenopathy.  She was diagnosed in January 2021.  New right lung nodules in June 2024  She completed course of concurrent chemoradiation with weekly carboplatin for an AUC of 2 and flexion of Taxol 45 mg/m.  She is status post 5 cycles and tolerated treatment well with a partial response.  She then underwent consolidation immunotherapy with Imfinzi 1500 mg IV every 4 weeks status post 13 cycles.  Her CT scan of the chest on Aug 22, 2021 showed no concerning findings for disease progression except for enlargement of the nodular area along the pleural surface in the right upper lobe concerning for disease recurrence this was followed by a PET scan on 09/05/2021 and that showed increased radiotracer uptake in the nodular density within the right upper lobe concerning for new site of metabolically active tumor. The  patient underwent curative radiotherapy to this lesion with SBRT under the care of Dr. Mitzi Hansen completed on October 18, 2021.   Her CT scan in June 2024 showed increase in size of the density of the right lung and increase of other pulmonary nodules in the right lung concerning for disease progression.  Therefore Dr. Arbutus Ped recommended a PET scan.  The patient was seen with Dr. Arbutus Ped today.  Dr. Arbutus Ped personally and independently reviewed the results of the PET scan and discussed the  results with the patient today.  The scan showed focal area of increased radiotracer uptake in the medial aspect of the right upper lobe which is suspicious for locally recurrent tumor and a small focus of increased uptake in the anterior right upper lobe is suspicious and could be inflammation and other tiny nodules that are below size threshold.   Dr. Arbutus Ped had a lengthly discussion with the patient today about her current condition and treatment options.  Dr. Arbutus Ped does not feel that the patient is a candidate for reirradiation to the area concerning for local recurrence.  Therefore, Dr. Arbutus Ped recommended treatment and pursuing hospice/palliative care versus. Treatment with systemic chemotherapy with carboplatin for an AUC of 5, Taxol 175 mg/m mg/m, and Keytruda 200 mg IV every 3 weeks.  The patient is interested in proceeding with systemic chemotherapy.  She is expected to start her first dose of this treatment about 10 days around 10/09/2022  We discussed the adverse side effects of treatment including but not limited to alopecia, myelosuppression, nausea and vomiting, peripheral neuropathy, liver or renal dysfunction as well as immunotherapy mediated adverse effects.   I sent prescription for Compazine 10 mg every 6 hours as needed for nausea.   The patient will follow-up in 2 weeks for a one-week follow-up visit after completing her first cycle of chemotherapy.  However, regarding the patient's hypotension Dr. Arbutus Ped recommends emergency room evaluation and management.  However the patient stated that if she was not evaluated promptly, that she will be leaving.  We strongly advised against this.   She was wheeled to the emergency room.  The patient does not take any antihypertensives.  She states that she drinks to 32 ounce bottles of water daily.  However, she states she did not eat today and took her muscle relaxer.  The patient's PET scan mention diffusely enlarged uterus without  focal increased uptake and recommends ultrasound.  I will reach out to Dr. Arbutus Ped for clarification if he would recommend referral to GYN versus pelvic ultrasound.   The patient was advised to call immediately if she has any concerning symptoms in the interval. The patient voices understanding of current disease status and treatment options and is in agreement with the current care plan. All questions were answered. The patient knows to call the clinic with any problems, questions or concerns. We can certainly see the patient much sooner if necessary   No orders of the defined types were placed in this encounter.     Derita Michelsen L Masie Bermingham, PA-C 09/29/22  ADDENDUM: Hematology/Oncology Attending: I had a face-to-face encounter with the patient today.  I reviewed her records, scan and recommended her care plan.  This is a very pleasant 67 years old white female with recurrent non-small cell lung cancer that was initially diagnosed as stage IIIa non-small cell lung cancer not otherwise specified presented with right upper lobe lung nodule in addition to mediastinal lymphadenopathy in January 2021 with evidence for new right upper lobe lung nodule  and opacity in June 2024. The patient is status post a course of concurrent chemoradiation followed by consolidation treatment with immunotherapy for 1 year then SBRT to the large right upper lobe pulmonary nodules in July 2023. She had a recent PET scan that showed increased hypermetabolic activity in right upper lobe lung opacities suspicious for local disease recurrence. I had a lengthy discussion with the patient and her husband today about her current condition and treatment options. I gave the patient the option of palliative care and close monitoring versus palliative systemic chemotherapy with carboplatin for AUC of 5, paclitaxel 175 Mg/M2 and Keytruda 200 Mg IV every 3 weeks for 4 cycles followed by maintenance treatment with single agent  Keytruda.  The patient is interested in treatment and I discussed with her the adverse effect of the treatment including myelosuppression, nausea and vomiting, peripheral neuropathy, liver or renal dysfunction as well as alopecia and the adverse effect of the immunotherapy. She is expected to start the first cycle of this treatment in around 10 days. For the hypotension, I strongly advised the patient to go immediately to the emergency department for further evaluation of her condition and to rule out any other underlying etiology. She was very reluctant to go to the emergency department because of the wait time but she will do it today. The patient was advised to call immediately if she has any concerning symptoms in the interval. The total time spent in the appointment was 30 minutes. Disclaimer: This note was dictated with voice recognition software. Similar sounding words can inadvertently be transcribed and may be missed upon review. Lajuana Matte, MD

## 2022-09-23 NOTE — ED Provider Notes (Signed)
[place] EMERGENCY DEPARTMENT AT [facility] Provider Note   CSN: [id number] Arrival date & time: 09/22/22  1955     History  Chief Complaint  Patient presents with   Abdominal Pain   Flank Pain    Britteney Warwick is a 67 y.o. female.  The history is provided by the patient and medical records.  Abdominal Pain Flank Pain Associated symptoms include abdominal pain.  Jisele Overdorf is a 67 y.o. female who presents to the Emergency Department complaining of abdominal pain.  She presents to the emergency department for evaluation of severe stabbing abdominal pain that started about 1 week ago.  She states the pain is mostly in the left lower quadrant but does at times occur in other areas of her abdomen.  Pain was initially intermittent but over the last 24 to 48 hours it has been constant.  She has been experiencing significant emesis over the last 24 hours.  No fevers, difficulty breathing, diarrhea, constipation, dysuria.  She has a history of non-small cell lung cancer, paroxysmal a flutter not on anticoagulation, coronary artery disease, COPD and hypertension.     Home Medications Prior to Admission medications   Medication Sig Start Date End Date Taking? Authorizing Provider  ondansetron (ZOFRAN-ODT) 4 MG disintegrating tablet Take 1 tablet (4 mg total) by mouth every 8 (eight) hours as needed for nausea or vomiting. 09/23/22  Yes Tilden Fossa, MD  aspirin EC 81 MG tablet Take 1 tablet (81 mg total) by mouth daily. 03/31/19   Patwardhan, Anabel Bene, MD  baclofen (LIORESAL) 10 MG tablet Take 1 tablet (10 mg total) by mouth 2 (two) times daily. 09/16/22   Walisiewicz, Caroleen Hamman, PA-C  Cholecalciferol (DIALYVITE VITAMIN D 5000) 125 MCG (5000 UT) capsule Take 5,000 Units by mouth daily.    [provider]  escitalopram (LEXAPRO) 10 MG tablet Take 10 mg by mouth daily. 04/12/19   [provider]  guaiFENesin (MUCINEX) 600 MG 12 hr tablet Take by mouth  2 (two) times daily.    [provider]  HYDROcodone-acetaminophen (NORCO) 10-325 MG tablet Take 1 tablet by mouth every 6 (six) hours as needed. 09/11/22   Pickenpack-Cousar, Arty Baumgartner, NP  meloxicam (MOBIC) 7.5 MG tablet Take 1 tablet (7.5 mg total) by mouth daily. 08/27/22   Pickenpack-Cousar, Arty Baumgartner, NP  pantoprazole (PROTONIX) 40 MG tablet TAKE ONE TABLET BY MOUTH TWICE A DAY 11/21/21   Tressia Danas, MD  rosuvastatin (CRESTOR) 20 MG tablet Take 20 mg by mouth every evening.  02/11/19   [provider]  Tiotropium Bromide-Olodaterol (STIOLTO RESPIMAT) 2.5-2.5 MCG/ACT AERS Inhale 2 puffs into the lungs daily. 06/22/19   Josephine Igo, DO  vitamin B-12 (CYANOCOBALAMIN) 1000 MCG tablet Take 1,000 mcg by mouth daily.    [provider]      Allergies    Lisinopril    Review of Systems   Review of Systems  Gastrointestinal:  Positive for abdominal pain.  Genitourinary:  Positive for flank pain.  All other systems reviewed and are negative.   Physical Exam Updated Vital Signs BP (!) 86/61   Pulse 93   Temp 99.1 F (37.3 C) (Oral)   Resp 16   Ht 5\' 3"  (1.6 m)   Wt 73.9 kg   SpO2 95%   BMI 28.87 kg/m  Physical Exam Vitals and nursing note reviewed.  Constitutional:      Appearance: She is well-developed.  HENT:     Head: Normocephalic and atraumatic.  Cardiovascular:     Rate and Rhythm: Normal rate and regular rhythm.     Heart sounds: No murmur heard. Pulmonary:     Effort: Pulmonary effort is normal. No respiratory distress.     Breath sounds: Normal breath sounds.  Abdominal:     Palpations: Abdomen is soft.     Tenderness: There is abdominal tenderness. There is no guarding or rebound.     Comments: Generalized abdominal tenderness  Musculoskeletal:        General: No swelling or tenderness.  Skin:    General: Skin is warm and dry.  Neurological:     Mental Status: She is alert and oriented to person, place, and time.  Psychiatric:         Behavior: Behavior normal.     ED Results / Procedures / Treatments   Labs (all labs ordered are listed, but only abnormal results are displayed) Labs Reviewed  URINALYSIS, ROUTINE W REFLEX MICROSCOPIC - Abnormal; Notable for the following components:      Result Value   Hgb urine dipstick LARGE (*)    Ketones, ur 20 (*)    Protein, ur >=300 (*)    Bacteria, UA RARE (*)    All other components within normal limits  BASIC METABOLIC PANEL - Abnormal; Notable for the following components:   Sodium 134 (*)    CO2 19 (*)    Glucose, Bld 130 (*)    Creatinine, Ser 1.21 (*)    GFR, Estimated 49 (*)    All other components within normal limits  HEPATIC FUNCTION PANEL - Abnormal; Notable for the following components:   Total Protein 8.6 (*)    All other components within normal limits  CBC  LIPASE, BLOOD  LACTIC ACID, PLASMA  LACTIC ACID, PLASMA    EKG None  Radiology CT Angio Abd/Pel W and/or Wo Contrast  Result Date: 09/23/2022 CLINICAL DATA:  67 year old female with lower abdominal pain radiating to the back, increasing over the last couple of days. History of non-small cell lung cancer. EXAM: CTA ABDOMEN AND PELVIS WITHOUT AND WITH CONTRAST TECHNIQUE: Multidetector CT imaging of the abdomen and pelvis was performed using the standard protocol during bolus administration of intravenous contrast. Multiplanar reconstructed images and MIPs were obtained and reviewed to evaluate the vascular anatomy. RADIATION DOSE REDUCTION: This exam was performed according to the departmental dose-optimization program which includes automated exposure control, adjustment of the mA and/or kV according to patient size and/or use of iterative reconstruction technique. CONTRAST:  75mL OMNIPAQUE IOHEXOL 350 MG/ML SOLN COMPARISON:  Noncontrast CT Abdomen and Pelvis yesterday. PET-CT 09/05/2021. FINDINGS: VASCULAR Extensive Aortoiliac calcified atherosclerosis. Major abdominal aortic branches are  patent, but there is moderate stenosis of the celiac artery origin (series 8, image 85). SMA atherosclerosis is comparatively mild. IMA remains patent. Atherosclerotic bilateral iliac and proximal femoral arteries remain patent, but there is moderate to severe bilateral superficial femoral artery stenosis (series 4, image 203 on the right). On the delayed phase images the portal venous system is patent. Review of the MIP images confirms the above findings. NON-VASCULAR Lower chest: Improved lung volumes but otherwise stable 2 PET-CT last year. No cardiomegaly, pericardial effusion, pleural effusion. Hepatobiliary: Small volume perihepatic simple density free fluid yesterday does not persist. Liver and gallbladder appear negative. Pancreas: Negative. Spleen: Negative. Adrenals/Urinary Tract: Adrenal glands are stable from the PET-CT last year, within normal limits. Chronic renal cortical scarring more pronounced on the right. Nonobstructed kidneys with symmetric renal enhancement. Renal vascular  calcifications. Nephrolithiasis difficult to exclude. Exophytic left renal cyst was benign by PET-CT last year (no follow-up imaging recommended). Mildly to moderately distended urinary bladder with mild mass effect from the uterus, but otherwise unremarkable. Incidental pelvic phleboliths. Stomach/Bowel: No dilated large or small bowel. Redundant right colon with cecum on a lax mesentery. Diminutive or absent appendix. No discrete bowel inflammation. Retained fluid in the stomach. Duodenum is decompressed. No free air. No free fluid identified in the abdomen now. Lymphatic: No lymphadenopathy identified. Reproductive: Heterogeneous large and lobulated uterus redemonstrated, with uterine fundal enlargement up to 15 cm transverse significantly progressed from PET-CT last year. Benign FDG appearance of the uterus at that time (multiple fibroids). No adnexal enlargement is evident. Other: Small volume of pelvis free fluid, in  the cul-de-sac with simple fluid density series 18, image 69. This is unchanged from yesterday, new or increased from the PET-CT last year. Musculoskeletal: No acute or suspicious osseous lesion identified. IMPRESSION: 1. Severe Aortic Atherosclerosis (ICD10-I70.0), but mesenteric vasculature remains patent. 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 Bilateral proximal femoral artery atherosclerotic stenosis. 2. Abnormal but nonspecific pelvis free fluid is stable from yesterday, with no bowel obstruction or bowel inflammation evident by CT. See also #3. 3. Progressive uterine enlargement and heterogeneity, especially the uterine fundus, from PET-CT last year. As stated yesterday it is difficult to exclude malignant transformation of uterine fibroid disease. Recommend GYN Surgery consultation. 4. No other acute or inflammatory process identified in the abdomen or pelvis. Electronically Signed   By: Odessa Fleming M.D.   On: 09/23/2022 05:16   CT Renal Stone Study  Result Date: 09/22/2022 CLINICAL DATA:  Abdominal/flank pain, stone suspected. EXAM: CT ABDOMEN AND PELVIS WITHOUT CONTRAST TECHNIQUE: Multidetector CT imaging of the abdomen and pelvis was performed following the standard protocol without IV contrast. RADIATION DOSE REDUCTION: This exam was performed according to the departmental dose-optimization program which includes automated exposure control, adjustment of the mA and/or kV according to patient size and/or use of iterative reconstruction technique. COMPARISON:  None Available. FINDINGS: Lower chest: No acute abnormality. Hepatobiliary: No focal liver abnormality is seen. No gallstones, gallbladder wall thickening, or biliary dilatation. Pancreas: Unremarkable. No pancreatic ductal dilatation or surrounding inflammatory changes. Spleen: Normal in size without focal abnormality. Adrenals/Urinary Tract: Adrenal glands are unremarkable. 2 mm nonobstructing calculus in the interpolar region and  another 4 mm calculus in the anterior aspect of the lower pole of the right kidney. Simple cysts in the lower pole of the left kidney. There are multiple 2-3 mm nonobstructing calculi in the left kidney. No evidence of hydronephrosis or ureteral calculus. Bladder is unremarkable. Stomach/Bowel: Stomach is within normal limits. Appendix appears normal. No evidence of bowel wall thickening, distention, or inflammatory changes. Vascular/Lymphatic: Aortic atherosclerosis. No enlarged abdominal or pelvic lymph nodes. Reproductive: There is a large heterogeneous uterus measuring at least 9.3 x 13.8 x 13.7 cm. This may represent a large multi fibroid uterus or a uterine carcinoma. Other: No abdominal wall hernia or abnormality. No abdominopelvic ascites. 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 No suspicious lesion or acute osseous abnormality Musculoskeletal: Multilevel degenerate disc disease of the lumbar spine. No acute osseous abnormality. IMPRESSION: 1. Large heterogeneous uterus measuring at least 9.3 x 13.8 x 13.7 cm, which may represent a large multi fibroid uterus or a uterine carcinoma. OBGYN consultation and further evaluation with MR examination with and without contrast is recommended. 2. Bilateral nonobstructing renal calculi measuring up to 4 mm in the lower  pole of the right kidney and 2 mm in the interpolar region of the right kidney. No evidence of hydronephrosis or ureteral calculus. 3. Normal appendix. No evidence of colitis or diverticulitis. 4. Multilevel degenerate disc disease of the lumbar spine. No acute osseous abnormality. Aortic Atherosclerosis (ICD10-I70.0). Electronically Signed   By: Larose Hires D.O.   On: 09/22/2022 23:41    Procedures Procedures    Medications Ordered in ED Medications  lactated ringers bolus 1,000 mL (0 mLs Intravenous Stopped 09/23/22 0654)  HYDROmorphone (DILAUDID) injection 1 mg (1 mg Intravenous Given 09/23/22 0437)  ondansetron (ZOFRAN)  injection 4 mg (4 mg Intravenous Given 09/23/22 0438)  iohexol (OMNIPAQUE) 350 MG/ML injection 75 mL (75 mLs Intravenous Contrast Given 09/23/22 0417)    ED Course/ Medical Decision Making/ A&P                             Medical Decision Making Amount and/or Complexity of Data Reviewed Labs: ordered. Radiology: ordered.  Risk Prescription drug management.   Patient with history of non-small cell lung cancer here for evaluation of lower abdominal pain for the last week with 24 hours of vomiting.  She has generalized tenderness on examination without peritoneal findings.  CT scan does demonstrate fibroid uterus versus uterine malignancy.  Given patient's history of atrial flutter and not being on anticoagulation as well as severe pain a CTA was obtained to rule out ischemic process.  CTA is negative for acute ischemia but does demonstrate atherosclerosis-discussed that finding with the patient.  Discussed finding of uterine fibroids, cannot rule out uterine malignancy based off of her CT scan.  Her pain is resolved on reassessment and she is tolerating p.o. fluids without difficulty.  Of note patient did have some episodes of hypotension during her ED stay.  On record review she has baseline low blood pressures, which are frequently in the 80s systolic on multiple outpatient visits.  She states that her blood pressure runs low at baseline and she feels unwell if it is higher.  UA is not consistent with UTI.  Discussed with patient close outpatient follow-up as well as return precautions for new or concerning symptoms.        Final Clinical Impression(s) / ED Diagnoses Final diagnoses:  Lower abdominal pain  Uterine mass    Rx / DC Orders ED Discharge Orders          Ordered    ondansetron (ZOFRAN-ODT) 4 MG disintegrating tablet  Every 8 hours PRN        09/23/22 0755              Tilden Fossa, MD 09/23/22 [phone]

## 2022-09-23 NOTE — ED Notes (Signed)
Pt given water for PO challenge. Pt was able to drink her water with no issues.

## 2022-09-23 NOTE — ED Notes (Signed)
US PIV placed. 

## 2022-09-26 ENCOUNTER — Ambulatory Visit (HOSPITAL_COMMUNITY)
Admission: RE | Admit: 2022-09-26 | Discharge: 2022-09-26 | Disposition: A | Discharge status: Home / Self Care | Payer: Medicare Other | Source: Ambulatory Visit | Attending: Internal Medicine | Admitting: Internal Medicine

## 2022-09-26 DIAGNOSIS — C3490 Malignant neoplasm of unspecified part of unspecified bronchus or lung: Secondary | ICD-10-CM | POA: Insufficient documentation

## 2022-09-26 LAB — GLUCOSE, CAPILLARY: Glucose-Capillary: 106 mg/dL — ABNORMAL HIGH (ref 70–99)

## 2022-09-26 MED ORDER — FLUDEOXYGLUCOSE F - 18 (FDG) INJECTION
7.9200 | Freq: Once | INTRAVENOUS | Status: AC
  Administered 2022-09-26: 7.92 via INTRAVENOUS

## 2022-09-29 ENCOUNTER — Inpatient Hospital Stay: Payer: Medicare Other | Attending: Physician Assistant | Admitting: Physician Assistant

## 2022-09-29 ENCOUNTER — Encounter: Payer: Self-pay | Admitting: Internal Medicine

## 2022-09-29 ENCOUNTER — Emergency Department (HOSPITAL_COMMUNITY)
Admission: EM | Admit: 2022-09-29 | Discharge: 2022-09-29 | Disposition: A | Discharge status: Home / Self Care | Payer: Medicare Other | Attending: Emergency Medicine | Admitting: Emergency Medicine

## 2022-09-29 ENCOUNTER — Other Ambulatory Visit: Payer: Self-pay

## 2022-09-29 VITALS — BP 64/58 | HR 87 | Resp 18 | Ht 63.0 in | Wt 155.9 lb

## 2022-09-29 DIAGNOSIS — F17200 Nicotine dependence, unspecified, uncomplicated: Secondary | ICD-10-CM | POA: Diagnosis not present

## 2022-09-29 DIAGNOSIS — C3411 Malignant neoplasm of upper lobe, right bronchus or lung: Secondary | ICD-10-CM

## 2022-09-29 DIAGNOSIS — Z7189 Other specified counseling: Secondary | ICD-10-CM | POA: Diagnosis not present

## 2022-09-29 DIAGNOSIS — Z85118 Personal history of other malignant neoplasm of bronchus and lung: Secondary | ICD-10-CM | POA: Diagnosis not present

## 2022-09-29 DIAGNOSIS — I959 Hypotension, unspecified: Secondary | ICD-10-CM | POA: Diagnosis present

## 2022-09-29 DIAGNOSIS — Z7982 Long term (current) use of aspirin: Secondary | ICD-10-CM | POA: Diagnosis not present

## 2022-09-29 DIAGNOSIS — R1084 Generalized abdominal pain: Secondary | ICD-10-CM | POA: Diagnosis not present

## 2022-09-29 DIAGNOSIS — I2510 Atherosclerotic heart disease of native coronary artery without angina pectoris: Secondary | ICD-10-CM | POA: Diagnosis not present

## 2022-09-29 LAB — CBC
HCT: 35.1 % — ABNORMAL LOW (ref 36.0–46.0)
Hemoglobin: 11.2 g/dL — ABNORMAL LOW (ref 12.0–15.0)
MCH: 30.7 pg (ref 26.0–34.0)
MCHC: 31.9 g/dL (ref 30.0–36.0)
MCV: 96.2 fL (ref 80.0–100.0)
Platelets: 338 10*3/uL (ref 150–400)
RBC: 3.65 MIL/uL — ABNORMAL LOW (ref 3.87–5.11)
RDW: 14 % (ref 11.5–15.5)
WBC: 10.2 10*3/uL (ref 4.0–10.5)
nRBC: 0 % (ref 0.0–0.2)

## 2022-09-29 LAB — TROPONIN I (HIGH SENSITIVITY): Troponin I (High Sensitivity): 9 ng/L (ref <18)

## 2022-09-29 LAB — BASIC METABOLIC PANEL
Anion gap: 12 (ref 5–15)
BUN: 20 mg/dL (ref 8–23)
CO2: 25 mmol/L (ref 22–32)
Calcium: 9.3 mg/dL (ref 8.9–10.3)
Chloride: 100 mmol/L (ref 98–111)
Creatinine, Ser: 1.32 mg/dL — ABNORMAL HIGH (ref 0.44–1.00)
GFR, Estimated: 44 mL/min — ABNORMAL LOW (ref >60)
Glucose, Bld: 89 mg/dL (ref 70–99)
Potassium: 3.4 mmol/L — ABNORMAL LOW (ref 3.5–5.1)
Sodium: 137 mmol/L (ref 135–145)

## 2022-09-29 LAB — LACTIC ACID, PLASMA: Lactic Acid, Venous: 1 mmol/L (ref 0.5–1.9)

## 2022-09-29 MED ORDER — SODIUM CHLORIDE 0.9 % IV SOLN
1000.0000 mL | INTRAVENOUS | Status: DC
  Administered 2022-09-29: 1000 mL via INTRAVENOUS

## 2022-09-29 MED ORDER — SODIUM CHLORIDE 0.9 % IV BOLUS (SEPSIS)
1000.0000 mL | Freq: Once | INTRAVENOUS | Status: AC
  Administered 2022-09-29: 1000 mL via INTRAVENOUS

## 2022-09-29 MED ORDER — PROCHLORPERAZINE MALEATE 10 MG PO TABS
10.0000 mg | ORAL_TABLET | Freq: Four times a day (QID) | ORAL | 2 refills | Status: AC | PRN
Start: 2022-09-29 — End: ?

## 2022-09-29 NOTE — ED Provider Notes (Signed)
[facility] AT [facility] Provider Note   CSN: [id number] Arrival date & time: 09/29/22  1558     History  Chief Complaint  Patient presents with   Hypotension    Betty Anderson is a 67 y.o. female.  HPI   Patient has a history of paroxysmal atrial flutter, aortic atherosclerosis, coronary artery disease, chronic tobacco use, malignant neoplasm of the right lung who presents to the ED for evaluation of low blood pressure.  Patient was in the emergency room recently on June 24.  Patient had an evaluation for abdominal pain.  She had a CT scan that demonstrated fibroid uterus versus uterine malignancy.  During that visit patient was noted to have episodes of low blood pressure.  Patient was otherwise asymptomatic.  Patient had a scheduled appointment in the oncology clinic today.  She was there for follow-up on imaging studies.  While she was there she did not to have a low blood pressure.  They suggested she come to the ED for further evaluation.  Blood pressure documented at that visit was 64/58.  Patient states she feels fine.  She is not having any chest pain or shortness of breath.  She does not have any vomiting or diarrhea.  She does not feel lightheaded or dizzy.  Patient does continue to have abdominal pain since her visit in the ED on June 25.  That is unchanged.  Home Medications Prior to Admission medications   Medication Sig Start Date End Date Taking? Authorizing Provider  aspirin EC 81 MG tablet Take 1 tablet (81 mg total) by mouth daily. 03/31/19   Patwardhan, Anabel Bene, MD  Cholecalciferol (DIALYVITE VITAMIN D 5000) 125 MCG (5000 UT) capsule Take 5,000 Units by mouth daily.    [provider]  escitalopram (LEXAPRO) 10 MG tablet Take 10 mg by mouth daily. 04/12/19   [provider]  guaiFENesin (MUCINEX) 600 MG 12 hr tablet Take by mouth 2 (two) times daily.    [provider]  HYDROcodone-acetaminophen (NORCO)  10-325 MG tablet Take 1 tablet by mouth every 6 (six) hours as needed. 09/11/22   Pickenpack-Cousar, Arty Baumgartner, NP  meloxicam (MOBIC) 15 MG tablet (PER PALL CARE) 1 tablet Orally Once a day    [provider]  ondansetron (ZOFRAN-ODT) 4 MG disintegrating tablet Take 1 tablet (4 mg total) by mouth every 8 (eight) hours as needed for nausea or vomiting. 09/23/22   Tilden Fossa, MD  pantoprazole (PROTONIX) 40 MG tablet TAKE ONE TABLET BY MOUTH TWICE A DAY 11/21/21   Tressia Danas, MD  prochlorperazine (COMPAZINE) 10 MG tablet Take 1 tablet (10 mg total) by mouth every 6 (six) hours as needed. 09/29/22   Heilingoetter, Cassandra L, PA-C  rosuvastatin (CRESTOR) 20 MG tablet Take 20 mg by mouth every evening.  02/11/19   [provider]  Tiotropium Bromide-Olodaterol (STIOLTO RESPIMAT) 2.5-2.5 MCG/ACT AERS Inhale 2 puffs into the lungs daily. 06/22/19   Josephine Igo, DO  vitamin B-12 (CYANOCOBALAMIN) 1000 MCG tablet Take 1,000 mcg by mouth daily.    [provider]      Allergies    Lisinopril    Review of Systems   Review of Systems  Physical Exam Updated Vital Signs BP 91/69   Pulse 92   Temp 97.9 F (36.6 C)   Resp (!) 21   SpO2 99%  Physical Exam Vitals and nursing note reviewed.  Constitutional:      Appearance: She is well-developed. She  is not ill-appearing or diaphoretic.  HENT:     Head: Normocephalic and atraumatic.     Right Ear: External ear normal.     Left Ear: External ear normal.  Eyes:     General: No scleral icterus.       Right eye: No discharge.        Left eye: No discharge.     Conjunctiva/sclera: Conjunctivae normal.  Neck:     Trachea: No tracheal deviation.  Cardiovascular:     Rate and Rhythm: Normal rate.  Pulmonary:     Effort: Pulmonary effort is normal. No respiratory distress.     Breath sounds: No stridor.  Abdominal:     General: There is no distension.     Palpations: There is no mass.     Tenderness: There is  abdominal tenderness.     Comments: Generalized tenderness  Musculoskeletal:        General: No swelling or deformity.     Cervical back: Neck supple.  Skin:    General: Skin is warm and dry.     Findings: No rash.  Neurological:     Mental Status: She is alert. Mental status is at baseline.     Cranial Nerves: No dysarthria or facial asymmetry.     Motor: No seizure activity.     ED Results / Procedures / Treatments   Labs (all labs ordered are listed, but only abnormal results are displayed) Labs Reviewed  CBC - Abnormal; Notable for the following components:      Result Value   RBC 3.65 (*)    Hemoglobin 11.2 (*)    HCT 35.1 (*)    All other components within normal limits  BASIC METABOLIC PANEL - Abnormal; Notable for the following components:   Potassium 3.4 (*)    Creatinine, Ser 1.32 (*)    GFR, Estimated 44 (*)    All other components within normal limits  LACTIC ACID, PLASMA  TROPONIN I (HIGH SENSITIVITY)    EKG EKG Interpretation Date/Time:  Monday September 29 2022 16:06:47 EDT Ventricular Rate:  79 PR Interval:  202 QRS Duration:  77 QT Interval:  299 QTC Calculation: 343 R Axis:   63  Text Interpretation: Sinus rhythm Probable LVH with secondary repol abnrm No significant change since last tracing Confirmed by Linwood Dibbles [phone]) on 09/29/2022 5:44:15 PM  Radiology No results found.  Procedures Procedures    Medications Ordered in ED Medications  sodium chloride 0.9 % bolus 1,000 mL (0 mLs Intravenous Stopped 09/29/22 1747)    Followed by  0.9 %  sodium chloride infusion (1,000 mLs Intravenous New Bag/Given 09/29/22 1748)    ED Course/ Medical Decision Making/ A&P Clinical Course as of 09/29/22 1800  Mon Sep 29, 2022  1638 Discussed doing an ultrasound based on her recent imaging studies..  Patient declines at this point [JK]  1741 CBC(!) Hemoglobin stable compared to previous.  Lactic acid level normal. [JK]  1742 Basic metabolic  panel(!) Electrolyte panel shows creatinine normal compared to previous [JK]  1742 Troponin I (High Sensitivity) Troponin normal [JK]  1743 Previous records reviewed.  Systolic blood pressures in the high 80s back in February March as well as April [JK]    Clinical Course User Index [JK] Linwood Dibbles, MD                             Medical Decision Making Differential diagnosis includes but  not limited to medication reaction, dehydration, anemia, infection, cardiac injury  Problems Addressed: Hypotension, unspecified hypotension type: acute illness or injury that poses a threat to life or bodily functions  Amount and/or Complexity of Data Reviewed Labs: ordered. Decision-making details documented in ED Course.  Risk Prescription drug management.  PET scan results reviewed from June 28.  It demonstrated possibly locally recurrent tumor in the right upper lobe..  Possible inflammation in the lung in the anterior right upper lobe.  Diffusely enlarged uterus  Patient presented to the ED for evaluation of low blood pressure.  Patient is asymptomatic.  She does have blood pressures ranging in the 80s to 90s systolic.  Mean arterial blood pressures greater than 65.  Patient's ED workup does not show any signs of electrolyte abnormalities.  No signs of severe dehydration.  Patient's hemoglobin is stable.  Cardiac enzymes are normal.  Lactic acid level is normal and she does not have a leukocytosis and does not demonstrate any signs of infection.  I reviewed previous blood pressures and patient's blood pressure is normally low with documented 80s and 90s systolic previously.  She was prescribed baclofen recently is possible that could be contributing to her low blood pressure.  Discussed have the patient stay in the hospital for observation considering her low blood pressure.  Patient however states she feels fine and prefer to go home.  Will have her stop her baclofen.  Patient instructed return to  the emergency room if she starts feeling symptomatic at all.  Discussed close outpatient follow-up with her oncologist.        Final Clinical Impression(s) / ED Diagnoses Final diagnoses:  Hypotension, unspecified hypotension type    Rx / DC Orders ED Discharge Orders     None         Linwood Dibbles, MD 09/29/22 1800

## 2022-09-29 NOTE — Progress Notes (Signed)
The doctor has been made aware that patients manual blood pressure reading is 64/58 in right arm, while sitting.  Patient states that she has not eaten today and she took a muscle relaxer this morning.  MD feels it is best for patient to got to the ED for evaluation.  Patient states that she will only go if a bed is available.  This nurse reached out to the ED and was made aware that patient can come to room 18.  This nurse will transport patient via wheelchair.  No further questions or concerns at this time.

## 2022-09-29 NOTE — Progress Notes (Signed)
DISCONTINUE ON PATHWAY REGIMEN - Non-Small Cell Lung     Administer weekly:     Paclitaxel      Carboplatin   **Always confirm dose/schedule in your pharmacy ordering system**  REASON: Disease Progression PRIOR TREATMENT: [id number]: Carboplatin AUC=2 + Paclitaxel 45 mg/m2 Weekly During Radiation TREATMENT RESPONSE: Progressive Disease (PD)  START OFF PATHWAY REGIMEN - Non-Small Cell Lung   [id number]:Carboplatin AUC=6 IV D1 + Paclitaxel 200 mg/m2 IV D1 + Pembrolizumab 200 mg IV D1 q21 Days x 4 Cycles:   A cycle is every 21 days:     Pembrolizumab      Paclitaxel      Carboplatin   **Always confirm dose/schedule in your pharmacy ordering system**  Patient Characteristics: Local Recurrence Therapeutic Status: Local Recurrence Intent of Therapy: Non-Curative / Palliative Intent, Discussed with Patient

## 2022-09-29 NOTE — ED Triage Notes (Signed)
Pt arrives from [facility]. No report given but Pt states they sent her here due to a hypotensive reading at [facility] center. Pt denies any complaints, dizziness, lightheadedness, or LOC at [facility]. Pt states she has normally low blood pressure.

## 2022-09-29 NOTE — Patient Instructions (Addendum)
-  There are two main categories of lung cancer, they are named based on the size of the cancer cell. One is called Non-Small cell lung cancer. The other type is Small Cell Lung Cancer -The sample (biopsy) that they took of your tumor was consistent with a subtype of Non-small cell lung cancer.  -We covered a lot of important information at your appointment today regarding what the treatment plan is moving forward. Here are the the main points that were discussed at your office visit with [place] today:  -The treatment that you will receive consists of two chemotherapy drugs, called Carboplatin and Paclitaxel (also referred to as Taxol) and one immunotherapy drug called Keytruda (pembrolizumab).  -We are planning on starting your treatment next week on 10/09/22 but before your start your treatment, I would like you to attend a Chemotherapy Education Class. This involves having you sit down with one of our nurse educators. She will discuss with you one-on-one more details about your treatment as well as general information about resources here at the cancer center.  -Your treatment will be given once every 3 weeks. We will check your labs once a week for the first ~5 treatments just to make sure that important components of your blood are in an acceptable range -You will need to return 2 days after your treatment to receive an injection. This injection is important because it boosts your infection fighting cells in your body and helps protect you from getting an infection.  -We will get a CT scan after 3 treatments to check on the progress of treatment  Medications:  -Compazine was sent to your pharmacy. This medication is for nausea. You may take this every 6 hours as needed if you feel nausous.   Referrals or Imaging:   Follow up:  -We will see you back for a follow up visit in about 2 weeks to see how your first treatment went and to make sure you are not having any side effects from treatment.   If you  need to contact our office, please do not hesitate, we are here to help. Our number is [phone]. When you call, ask to speak to Cassie's or Dr. Asa Lente nurse.

## 2022-09-29 NOTE — Discharge Instructions (Signed)
It is possible that the baclofen is contributing to your low blood pressure.  Stop taking that medication.  Drink plenty of fluids.  Return to the ER if you start feeling lightheaded dizzy.  Follow-up with your cancer doctors to be rechecked

## 2022-10-01 ENCOUNTER — Telehealth: Payer: Self-pay | Admitting: Internal Medicine

## 2022-10-01 NOTE — Telephone Encounter (Signed)
Scheduled per 07/01 los, patient has been called and notified of all upcoming appointments.

## 2022-10-06 ENCOUNTER — Other Ambulatory Visit: Payer: Self-pay | Admitting: Physician Assistant

## 2022-10-06 ENCOUNTER — Telehealth: Payer: Medicare Other

## 2022-10-06 ENCOUNTER — Telehealth: Payer: Self-pay

## 2022-10-06 DIAGNOSIS — N852 Hypertrophy of uterus: Secondary | ICD-10-CM

## 2022-10-06 NOTE — Telephone Encounter (Signed)
Per [facility] GYN they are not taking new patients at this time.  New referral faxed to [facility].  Confirmation received

## 2022-10-06 NOTE — Progress Notes (Signed)
Referral placed to GYN for CT findings from ER for "Progressive uterine enlargement and heterogeneity, especially the uterine fundus, from PET-CT last year. As stated yesterday it is difficult to exclude malignant transformation of uterine fibroid disease. Recommend GYN Surgery consultation".

## 2022-10-06 NOTE — Telephone Encounter (Signed)
She went to the ER a bit ago about her uterus enlargement. I forgot to ask her when we sent her to ER at her last appointment if she has a OBGYN? Dr. Arbutus Ped wants her to see them for the uterus to have them consider ultrasound.   If she does not have a gyn, then please let us know and we will refer her.  She does not have a GYN.  Referral faxed to [facility] GYN  Confirmation received.

## 2022-10-08 MED FILL — Fosaprepitant Dimeglumine For IV Infusion 150 MG (Base Eq): INTRAVENOUS | Qty: 5 | Status: AC

## 2022-10-08 MED FILL — Dexamethasone Sodium Phosphate Inj 100 MG/10ML: INTRAMUSCULAR | Qty: 1 | Status: AC

## 2022-10-08 NOTE — Progress Notes (Unsigned)
Palliative Medicine [facility]  [phone] [phone]) [phone]   Name: Betty Anderson Date: 10/08/2022 MRN: [id number]  DOB: 04-Mar-1956  Patient Care Team: Rodrigo Ran, MD as PCP - General (Internal Medicine) Syliva Overman, RN as Oncology Nurse Navigator   INTERVAL HISTORY: Betty Anderson is a 67 y.o. female with oncologic medical history including non-small cell lung cancer (04/2019) currently on observation, as well as A-Flutter, CAD, COPD, and HTN. Palliative ask to see for symptom and pain management and goals of care.   SOCIAL HISTORY:    Betty Anderson reports that she has been smoking cigarettes. She started smoking about 60 years ago. She has a 175.00 pack-year smoking history. She has never used smokeless tobacco. She reports current alcohol use of about 8.0 standard drinks of alcohol per week. She reports that she does not use drugs.  ADVANCE DIRECTIVES:  None on file  CODE STATUS: Full Code  PAST MEDICAL HISTORY: Past Medical History:  Diagnosis Date   COPD (chronic obstructive pulmonary disease) (HCC)    emphysema   Hyperlipidemia    Hypertension    lung ca dx'd 03/2019   Lung nodule    Paroxysmal atrial flutter (HCC)    per cardiology note   Peripheral artery disease (HCC)    Tobacco abuse    Vitamin D deficiency    Wears glasses     ALLERGIES:  is allergic to lisinopril.  MEDICATIONS:  Current Outpatient Medications  Medication Sig Dispense Refill   aspirin EC 81 MG tablet Take 1 tablet (81 mg total) by mouth daily. 90 tablet 3   Cholecalciferol (DIALYVITE VITAMIN D 5000) 125 MCG (5000 UT) capsule Take 5,000 Units by mouth daily.     escitalopram (LEXAPRO) 10 MG tablet Take 10 mg by mouth daily.     guaiFENesin (MUCINEX) 600 MG 12 hr tablet Take by mouth 2 (two) times daily.     HYDROcodone-acetaminophen (NORCO) 10-325 MG tablet Take 1 tablet by mouth every 6 (six) hours as needed. 60 tablet 0   meloxicam  (MOBIC) 15 MG tablet (PER PALL CARE) 1 tablet Orally Once a day     ondansetron (ZOFRAN-ODT) 4 MG disintegrating tablet Take 1 tablet (4 mg total) by mouth every 8 (eight) hours as needed for nausea or vomiting. 20 tablet 0   pantoprazole (PROTONIX) 40 MG tablet TAKE ONE TABLET BY MOUTH TWICE A DAY 180 tablet 2   prochlorperazine (COMPAZINE) 10 MG tablet Take 1 tablet (10 mg total) by mouth every 6 (six) hours as needed. 30 tablet 2   rosuvastatin (CRESTOR) 20 MG tablet Take 20 mg by mouth every evening.      Tiotropium Bromide-Olodaterol (STIOLTO RESPIMAT) 2.5-2.5 MCG/ACT AERS Inhale 2 puffs into the lungs daily. 4 g 0   vitamin B-12 (CYANOCOBALAMIN) 1000 MCG tablet Take 1,000 mcg by mouth daily.     No current facility-administered medications for this visit.    VITAL SIGNS: There were no vitals taken for this visit. There were no vitals filed for this visit.  Estimated body mass index is 27.62 kg/m as calculated from the following:   Height as of 09/29/22: 5\' 3"  (1.6 m).   Weight as of 09/29/22: 155 lb 14.4 oz (70.7 kg).   PERFORMANCE STATUS (ECOG) : 1 - Symptomatic but completely ambulatory  Assessment NAD RRR Normal breathing pattern AAO x4  IMPRESSION:  I saw Betty Anderson during her infusion. No acute distress. Denies nausea, vomiting constipation, or diarrhea. Occasional fatigue.  Is trying to remain as active as possible. She is awaiting further follow-up with GI for work-up. Appetite is fair. Some days better than others.    Pain:  Betty Anderson reports pain is manageable most days with medication. Describes abdominal pain as constant and sharp. Pain worsens depending on position, when urinating, having bowel movement, and passing gas. She is taking mobic and hydrocodone as needed. Not requiring around the clock. Speaks of anxiousness of finding out what is going on with her health. Support provided.   No changes in regimen at this time. Encouraged bowel regimen.   We will  continue to closely monitor and adjust and needed.    PLAN: Mobic 7.5 mg twice daily Norco 1 tablet every 6 hours as needed Continue Senna-S to 2 tablets twice daily for constipation. Palliative will plan to see patient back in 3-4 weeks, or sooner, in collaboration to other oncology appointments.    Patient expressed understanding and was in agreement with this plan. She also understands that she can call the clinic at any time with any questions, concerns, or complaints.   Any controlled substances utilized were prescribed in the context of palliative care. PDMP has been reviewed.   Visit consisted of counseling and education dealing with the complex and emotionally intense issues of symptom management and palliative care in the setting of serious and potentially life-threatening illness.Greater than 50%  of this time was spent counseling and coordinating care related to the above assessment and plan.   Willette Alma, AGPCNP-BC  Palliative Medicine Team/[facility]  *Please note that this is a verbal dictation therefore any spelling or grammatical errors are due to the "[employer]agon [employer] One" system interpretation.

## 2022-10-09 ENCOUNTER — Ambulatory Visit: Payer: Medicare Other | Admitting: Internal Medicine

## 2022-10-09 ENCOUNTER — Encounter: Payer: Self-pay | Admitting: Nurse Practitioner

## 2022-10-09 ENCOUNTER — Inpatient Hospital Stay: Payer: Medicare Other

## 2022-10-09 ENCOUNTER — Other Ambulatory Visit: Payer: Medicare Other

## 2022-10-09 ENCOUNTER — Ambulatory Visit: Payer: Medicare Other

## 2022-10-09 ENCOUNTER — Other Ambulatory Visit: Payer: Self-pay

## 2022-10-09 ENCOUNTER — Inpatient Hospital Stay (HOSPITAL_BASED_OUTPATIENT_CLINIC_OR_DEPARTMENT_OTHER): Payer: Medicare Other | Admitting: Nurse Practitioner

## 2022-10-09 DIAGNOSIS — R5383 Other fatigue: Secondary | ICD-10-CM | POA: Diagnosis not present

## 2022-10-09 DIAGNOSIS — I700 Atherosclerosis of aorta: Secondary | ICD-10-CM | POA: Insufficient documentation

## 2022-10-09 DIAGNOSIS — Z515 Encounter for palliative care: Secondary | ICD-10-CM | POA: Diagnosis not present

## 2022-10-09 DIAGNOSIS — C3411 Malignant neoplasm of upper lobe, right bronchus or lung: Secondary | ICD-10-CM | POA: Diagnosis not present

## 2022-10-09 DIAGNOSIS — Z9089 Acquired absence of other organs: Secondary | ICD-10-CM | POA: Diagnosis not present

## 2022-10-09 DIAGNOSIS — I4892 Unspecified atrial flutter: Secondary | ICD-10-CM | POA: Diagnosis not present

## 2022-10-09 DIAGNOSIS — J449 Chronic obstructive pulmonary disease, unspecified: Secondary | ICD-10-CM | POA: Diagnosis not present

## 2022-10-09 DIAGNOSIS — I70203 Unspecified atherosclerosis of native arteries of extremities, bilateral legs: Secondary | ICD-10-CM | POA: Insufficient documentation

## 2022-10-09 DIAGNOSIS — M47814 Spondylosis without myelopathy or radiculopathy, thoracic region: Secondary | ICD-10-CM | POA: Diagnosis not present

## 2022-10-09 DIAGNOSIS — I2510 Atherosclerotic heart disease of native coronary artery without angina pectoris: Secondary | ICD-10-CM | POA: Insufficient documentation

## 2022-10-09 DIAGNOSIS — F17210 Nicotine dependence, cigarettes, uncomplicated: Secondary | ICD-10-CM | POA: Diagnosis not present

## 2022-10-09 DIAGNOSIS — Z888 Allergy status to other drugs, medicaments and biological substances status: Secondary | ICD-10-CM | POA: Insufficient documentation

## 2022-10-09 DIAGNOSIS — Z86018 Personal history of other benign neoplasm: Secondary | ICD-10-CM | POA: Insufficient documentation

## 2022-10-09 DIAGNOSIS — M5136 Other intervertebral disc degeneration, lumbar region: Secondary | ICD-10-CM | POA: Diagnosis not present

## 2022-10-09 DIAGNOSIS — Z9221 Personal history of antineoplastic chemotherapy: Secondary | ICD-10-CM | POA: Insufficient documentation

## 2022-10-09 DIAGNOSIS — K551 Chronic vascular disorders of intestine: Secondary | ICD-10-CM | POA: Diagnosis not present

## 2022-10-09 DIAGNOSIS — I10 Essential (primary) hypertension: Secondary | ICD-10-CM | POA: Diagnosis not present

## 2022-10-09 DIAGNOSIS — D259 Leiomyoma of uterus, unspecified: Secondary | ICD-10-CM | POA: Insufficient documentation

## 2022-10-09 DIAGNOSIS — Q438 Other specified congenital malformations of intestine: Secondary | ICD-10-CM | POA: Diagnosis not present

## 2022-10-09 DIAGNOSIS — N200 Calculus of kidney: Secondary | ICD-10-CM | POA: Diagnosis not present

## 2022-10-09 DIAGNOSIS — Z79899 Other long term (current) drug therapy: Secondary | ICD-10-CM | POA: Diagnosis not present

## 2022-10-09 DIAGNOSIS — K5900 Constipation, unspecified: Secondary | ICD-10-CM | POA: Insufficient documentation

## 2022-10-09 DIAGNOSIS — R530 Neoplastic (malignant) related fatigue: Secondary | ICD-10-CM

## 2022-10-09 DIAGNOSIS — Z923 Personal history of irradiation: Secondary | ICD-10-CM | POA: Insufficient documentation

## 2022-10-09 DIAGNOSIS — I959 Hypotension, unspecified: Secondary | ICD-10-CM | POA: Diagnosis not present

## 2022-10-09 DIAGNOSIS — D649 Anemia, unspecified: Secondary | ICD-10-CM | POA: Insufficient documentation

## 2022-10-09 DIAGNOSIS — G893 Neoplasm related pain (acute) (chronic): Secondary | ICD-10-CM

## 2022-10-09 LAB — CBC WITH DIFFERENTIAL (CANCER CENTER ONLY)
Abs Immature Granulocytes: 0.02 10*3/uL (ref 0.00–0.07)
Basophils Absolute: 0.1 10*3/uL (ref 0.0–0.1)
Basophils Relative: 1 %
Eosinophils Absolute: 0.2 10*3/uL (ref 0.0–0.5)
Eosinophils Relative: 2 %
HCT: 33.2 % — ABNORMAL LOW (ref 36.0–46.0)
Hemoglobin: 11 g/dL — ABNORMAL LOW (ref 12.0–15.0)
Immature Granulocytes: 0 %
Lymphocytes Relative: 9 %
Lymphs Abs: 1 10*3/uL (ref 0.7–4.0)
MCH: 31.3 pg (ref 26.0–34.0)
MCHC: 33.1 g/dL (ref 30.0–36.0)
MCV: 94.3 fL (ref 80.0–100.0)
Monocytes Absolute: 0.5 10*3/uL (ref 0.1–1.0)
Monocytes Relative: 5 %
Neutro Abs: 9.3 10*3/uL — ABNORMAL HIGH (ref 1.7–7.7)
Neutrophils Relative %: 83 %
Platelet Count: 436 10*3/uL — ABNORMAL HIGH (ref 150–400)
RBC: 3.52 MIL/uL — ABNORMAL LOW (ref 3.87–5.11)
RDW: 13.9 % (ref 11.5–15.5)
WBC Count: 11.1 10*3/uL — ABNORMAL HIGH (ref 4.0–10.5)
nRBC: 0 % (ref 0.0–0.2)

## 2022-10-09 LAB — CMP (CANCER CENTER ONLY)
ALT: 5 U/L (ref 0–44)
AST: 9 U/L — ABNORMAL LOW (ref 15–41)
Albumin: 3.3 g/dL — ABNORMAL LOW (ref 3.5–5.0)
Alkaline Phosphatase: 77 U/L (ref 38–126)
Anion gap: 8 (ref 5–15)
BUN: 13 mg/dL (ref 8–23)
CO2: 24 mmol/L (ref 22–32)
Calcium: 9 mg/dL (ref 8.9–10.3)
Chloride: 106 mmol/L (ref 98–111)
Creatinine: 1.37 mg/dL — ABNORMAL HIGH (ref 0.44–1.00)
GFR, Estimated: 42 mL/min — ABNORMAL LOW (ref >60)
Glucose, Bld: 107 mg/dL — ABNORMAL HIGH (ref 70–99)
Potassium: 3.6 mmol/L (ref 3.5–5.1)
Sodium: 138 mmol/L (ref 135–145)
Total Bilirubin: 0.4 mg/dL (ref 0.3–1.2)
Total Protein: 7.3 g/dL (ref 6.5–8.1)

## 2022-10-09 LAB — TSH: TSH: 2.414 u[IU]/mL (ref 0.350–4.500)

## 2022-10-09 MED ORDER — MELOXICAM 7.5 MG PO TABS
7.5000 mg | ORAL_TABLET | Freq: Two times a day (BID) | ORAL | 0 refills | Status: DC
Start: 2022-10-09 — End: 2022-10-30

## 2022-10-09 MED ORDER — HYDROCODONE-ACETAMINOPHEN 10-325 MG PO TABS
1.0000 | ORAL_TABLET | Freq: Four times a day (QID) | ORAL | 0 refills | Status: DC | PRN
Start: 2022-10-09 — End: 2022-10-30

## 2022-10-09 MED ORDER — SODIUM CHLORIDE 0.9 % IV SOLN: Freq: Once | INTRAVENOUS | Status: AC

## 2022-10-09 NOTE — Patient Instructions (Signed)

## 2022-10-09 NOTE — Progress Notes (Signed)
Per Dr. Arbutus Ped - nurse to hold treatment today. Patient to receive 1 L of fluids. If no improvement of BP, patient is to go to ED for further evaluation.

## 2022-10-09 NOTE — Progress Notes (Signed)
After 1 L of IVFs BP 75/52, this RN made Dr. Arbutus Ped aware. Dr. Arbutus Ped informed the Pt that she would not start her tx until her BP is WDL. MD recommended Pt go to the ED for further evaluation and fluids. Pt refused to go to the ED. This RN notified MD of Pt's refusal to go to ED, per MD OK to discharge home. This RN provided Pt education on s/s of hypotension, to call the cancer center for concerns and to report to the ED if condition changes. Pt verbalized understanding and stated "I will increase my fluids during the day to see if that will help my blood pressure". Pt was ambulatory to lobby in stable condition at the time of discharge.

## 2022-10-10 LAB — T4: T4, Total: 8.7 ug/dL (ref 4.5–12.0)

## 2022-10-11 ENCOUNTER — Inpatient Hospital Stay: Payer: Medicare Other

## 2022-10-15 ENCOUNTER — Other Ambulatory Visit: Payer: Self-pay | Admitting: Physician Assistant

## 2022-10-15 ENCOUNTER — Other Ambulatory Visit: Payer: Self-pay

## 2022-10-15 DIAGNOSIS — C3411 Malignant neoplasm of upper lobe, right bronchus or lung: Secondary | ICD-10-CM

## 2022-10-15 NOTE — Progress Notes (Signed)
[facility] OFFICE PROGRESS NOTE  Rodrigo Ran, MD [address] [place] [address]  DIAGNOSIS: ***  PRIOR THERAPY:  CURRENT THERAPY:  INTERVAL HISTORY: Betty Anderson 67 y.o. female returns for *** regular *** visit for followup of ***   MEDICAL HISTORY: Past Medical History:  Diagnosis Date   COPD (chronic obstructive pulmonary disease) (HCC)    emphysema   Hyperlipidemia    Hypertension    lung ca dx'd 03/2019   Lung nodule    Paroxysmal atrial flutter (HCC)    per cardiology note   Peripheral artery disease (HCC)    Tobacco abuse    Vitamin D deficiency    Wears glasses     ALLERGIES:  is allergic to lisinopril.  MEDICATIONS:  Current Outpatient Medications  Medication Sig Dispense Refill   aspirin EC 81 MG tablet Take 1 tablet (81 mg total) by mouth daily. 90 tablet 3   Cholecalciferol (DIALYVITE VITAMIN D 5000) 125 MCG (5000 UT) capsule Take 5,000 Units by mouth daily.     escitalopram (LEXAPRO) 10 MG tablet Take 10 mg by mouth daily.     guaiFENesin (MUCINEX) 600 MG 12 hr tablet Take by mouth 2 (two) times daily.     HYDROcodone-acetaminophen (NORCO) 10-325 MG tablet Take 1 tablet by mouth every 6 (six) hours as needed. 60 tablet 0   meloxicam (MOBIC) 7.5 MG tablet Take 1 tablet (7.5 mg total) by mouth in the morning and at bedtime. 60 tablet 0   ondansetron (ZOFRAN-ODT) 4 MG disintegrating tablet Take 1 tablet (4 mg total) by mouth every 8 (eight) hours as needed for nausea or vomiting. 20 tablet 0   pantoprazole (PROTONIX) 40 MG tablet TAKE ONE TABLET BY MOUTH TWICE A DAY 180 tablet 2   prochlorperazine (COMPAZINE) 10 MG tablet Take 1 tablet (10 mg total) by mouth every 6 (six) hours as needed. 30 tablet 2   rosuvastatin (CRESTOR) 20 MG tablet Take 20 mg by mouth every evening.      Tiotropium Bromide-Olodaterol (STIOLTO RESPIMAT) 2.5-2.5 MCG/ACT AERS Inhale 2 puffs into the lungs daily. 4 g 0   vitamin B-12 (CYANOCOBALAMIN) 1000 MCG  tablet Take 1,000 mcg by mouth daily.     No current facility-administered medications for this visit.    SURGICAL HISTORY:  Past Surgical History:  Procedure Laterality Date   BRONCHIAL NEEDLE ASPIRATION BIOPSY  04/12/2019   Procedure: BRONCHIAL NEEDLE ASPIRATION BIOPSIES;  Surgeon: Josephine Igo, DO;  Location: [facility];  Service: Cardiopulmonary;;   ENDOBRONCHIAL ULTRASOUND N/A 04/12/2019   Procedure: ENDOBRONCHIAL ULTRASOUND;  Surgeon: Josephine Igo, DO;  Location: [facility];  Service: Cardiopulmonary;  Laterality: N/A;   OVARIAN CYST REMOVAL     TONSILLECTOMY     TUBAL LIGATION     VIDEO BRONCHOSCOPY N/A 04/12/2019   Procedure: VIDEO BRONCHOSCOPY WITHOUT FLUORO;  Surgeon: Josephine Igo, DO;  Location: [facility];  Service: Cardiopulmonary;  Laterality: N/A;    REVIEW OF SYSTEMS:   Review of Systems  Constitutional: Negative for appetite change, chills, fatigue, fever and unexpected weight change.  HENT:   Negative for mouth sores, nosebleeds, sore throat and trouble swallowing.   Eyes: Negative for eye problems and icterus.  Respiratory: Negative for cough, hemoptysis, shortness of breath and wheezing.   Cardiovascular: Negative for chest pain and leg swelling.  Gastrointestinal: Negative for abdominal pain, constipation, diarrhea, nausea and vomiting.  Genitourinary: Negative for bladder incontinence, difficulty urinating, dysuria, frequency and hematuria.   Musculoskeletal: Negative  for back pain, gait problem, neck pain and neck stiffness.  Skin: Negative for itching and rash.  Neurological: Negative for dizziness, extremity weakness, gait problem, headaches, light-headedness and seizures.  Hematological: Negative for adenopathy. Does not bruise/bleed easily.  Psychiatric/Behavioral: Negative for confusion, depression and sleep disturbance. The patient is not nervous/anxious.     PHYSICAL EXAMINATION:  There were no vitals taken for this visit.  ECOG  PERFORMANCE STATUS: {CHL ONC ECOG [id number]  Physical Exam  Constitutional: Oriented to person, place, and time and well-developed, well-nourished, and in no distress. No distress.  HENT:  Head: Normocephalic and atraumatic.  Mouth/Throat: Oropharynx is clear and moist. No oropharyngeal exudate.  Eyes: Conjunctivae are normal. Right eye exhibits no discharge. Left eye exhibits no discharge. No scleral icterus.  Neck: Normal range of motion. Neck supple.  Cardiovascular: Normal rate, regular rhythm, normal heart sounds and intact distal pulses.   Pulmonary/Chest: Effort normal and breath sounds normal. No respiratory distress. No wheezes. No rales.  Abdominal: Soft. Bowel sounds are normal. Exhibits no distension and no mass. There is no tenderness.  Musculoskeletal: Normal range of motion. Exhibits no edema.  Lymphadenopathy:    No cervical adenopathy.  Neurological: Alert and oriented to person, place, and time. Exhibits normal muscle tone. Gait normal. Coordination normal.  Skin: Skin is warm and dry. No rash noted. Not diaphoretic. No erythema. No pallor.  Psychiatric: Mood, memory and judgment normal.  Vitals reviewed.  LABORATORY DATA: Lab Results  Component Value Date   WBC 11.1 (H) 10/09/2022   HGB 11.0 (L) 10/09/2022   HCT 33.2 (L) 10/09/2022   MCV 94.3 10/09/2022   PLT 436 (H) 10/09/2022      Chemistry      Component Value Date/Time   NA 138 10/09/2022 0934   K 3.6 10/09/2022 0934   CL 106 10/09/2022 0934   CO2 24 10/09/2022 0934   BUN 13 10/09/2022 0934   CREATININE 1.37 (H) 10/09/2022 0934      Component Value Date/Time   CALCIUM 9.0 10/09/2022 0934   ALKPHOS 77 10/09/2022 0934   AST 9 (L) 10/09/2022 0934   ALT 5 10/09/2022 0934   BILITOT 0.4 10/09/2022 0934       RADIOGRAPHIC STUDIES:  NM PET Image Restage (PS) Skull Base to Thigh (F-18 FDG)  Result Date: 09/29/2022 CLINICAL DATA:  Subsequent treatment strategy for non-small cell lung cancer.  EXAM: NUCLEAR MEDICINE PET SKULL BASE TO THIGH TECHNIQUE: 7.92 mCi F-18 FDG was injected intravenously. Full-ring PET imaging was performed from the skull base to thigh after the radiotracer. CT data was obtained and used for attenuation correction and anatomic localization. Fasting blood glucose: 106 mg/dl COMPARISON:  CT from 16/12/9602 and PET-CT from 09/05/2021 FINDINGS: Mediastinal blood pool activity: SUV max 2.98 Liver activity: SUV max NA NECK: No hypermetabolic lymph nodes in the neck. Incidental CT findings: None CHEST: No tracer avid mediastinal, hilar, axillary, or supraclavicular lymph nodes. Within the area of radiation change involving the right upper lobe there is a focal area of increased uptake identified medially which measures 1.5 x 1.2 with SUV max of 10.78, image 48/4. Within the anterior right upper lobe there is a small focus increased radiotracer uptake with SUV max of 4.97. This corresponds to a subpleural non solid nodular density measuring 1.2 by 0.9 cm, image 50/4. 4 mm nodule in the superior segment of the right lower lobe is stable, image 21/7. This is too small to characterize by PET-CT. Stable faint nodule in the  superior segment of left lower lobe measuring 4 mm, image 26/7. Unchanged from previous exam. Incidental CT findings: Emphysema. Aortic atherosclerosis and coronary artery calcifications. ABDOMEN/PELVIS: No abnormal hypermetabolic activity within the liver, pancreas, adrenal glands, or spleen. No hypermetabolic lymph nodes in the abdomen or pelvis. Incidental CT findings: Aortic atherosclerotic calcifications. Diffusely enlarged uterus is again noted without internal increased uptake. This measures 13.7 x 10.2 cm, image 147/4. Formally this measured 14.7 by 8.2 cm. SKELETON: No focal hypermetabolic activity to suggest skeletal metastasis. Incidental CT findings: None. IMPRESSION: 1. There is a focal area of increased radiotracer uptake identified within the medial aspect of  the radiation change involving the right upper lobe. SUV max is equal to 10.78. This is a new finding when compared with the prior PET-CT from 06/08/202. This is moderately suspicious for locally recurrent tumor. 2. There is a smaller focus of increased radiotracer uptake identified within the anterior right upper lobe corresponding to a non solid nodular density measuring 1.2 by 0.9 cm. This is nonspecific and could represent an area of inflammation. 3. Unchanged tiny nodules within the superior segment right lower lobe and superior segment of left lower lobe. These are too small to characterize by PET-CT but appear unchanged when compared with 09/01/2022. 4. No evidence of metastatic disease within the abdomen or pelvis. 5. Diffusely enlarged uterus is again noted without internal increased uptake. This measures 13.7 x 10.2 cm. Formally this measured 14.7 by 8.2 cm. Consider correlation with pelvic sonogram. 6. Aortic Atherosclerosis (ICD10-I70.0) and Emphysema (ICD10-J43.9). Electronically Signed   By: Signa Kell M.D.   On: 09/29/2022 10:13   CT Angio Abd/Pel W and/or Wo Contrast  Result Date: 09/23/2022 CLINICAL DATA:  67 year old female with lower abdominal pain radiating to the back, increasing over the last couple of days. History of non-small cell lung cancer. EXAM: CTA ABDOMEN AND PELVIS WITHOUT AND WITH CONTRAST TECHNIQUE: Multidetector CT imaging of the abdomen and pelvis was performed using the standard protocol during bolus administration of intravenous contrast. Multiplanar reconstructed images and MIPs were obtained and reviewed to evaluate the vascular anatomy. RADIATION DOSE REDUCTION: This exam was performed according to the departmental dose-optimization program which includes automated exposure control, adjustment of the mA and/or kV according to patient size and/or use of iterative reconstruction technique. CONTRAST:  75mL OMNIPAQUE IOHEXOL 350 MG/ML SOLN COMPARISON:  Noncontrast CT  Abdomen and Pelvis yesterday. PET-CT 09/05/2021. FINDINGS: VASCULAR Extensive Aortoiliac calcified atherosclerosis. 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 SMA atherosclerosis is comparatively mild. IMA remains patent. Atherosclerotic bilateral iliac and proximal femoral arteries remain patent, but there is moderate to severe bilateral superficial femoral artery stenosis (series 4, image 203 on the right). On the delayed phase images the portal venous system is patent. Review of the MIP images confirms the above findings. NON-VASCULAR Lower chest: Improved lung volumes but otherwise stable 2 PET-CT last year. No cardiomegaly, pericardial effusion, pleural effusion. Hepatobiliary: Small volume perihepatic simple density free fluid yesterday does not persist. Liver and gallbladder appear negative. Pancreas: Negative. Spleen: Negative. Adrenals/Urinary Tract: Adrenal glands are stable from the PET-CT last year, within normal limits. Chronic renal cortical scarring more pronounced on the right. Nonobstructed kidneys with symmetric renal enhancement. Renal vascular calcifications. Nephrolithiasis difficult to exclude. Exophytic left renal cyst was benign by PET-CT last year (no follow-up imaging recommended). Mildly to moderately distended urinary bladder with mild mass effect from the uterus, but otherwise unremarkable.  Incidental pelvic phleboliths. Stomach/Bowel: No dilated large or small bowel. Redundant right colon with cecum on a lax mesentery. Diminutive or absent appendix. No discrete bowel inflammation. Retained fluid in the stomach. Duodenum is decompressed. No free air. No free fluid identified in the abdomen now. Lymphatic: No lymphadenopathy identified. Reproductive: Heterogeneous large and lobulated uterus redemonstrated, with uterine fundal enlargement up to 15 cm transverse significantly progressed from PET-CT last year.  Benign FDG appearance of the uterus at that time (multiple fibroids). No adnexal enlargement is evident. Other: Small volume of pelvis free fluid, in the cul-de-sac with simple fluid density series 18, image 69. This is unchanged from yesterday, new or increased from the PET-CT last year. Musculoskeletal: No acute or suspicious osseous lesion identified. IMPRESSION: 1. Severe Aortic Atherosclerosis (ICD10-I70.0), but mesenteric vasculature remains patent. 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 Bilateral proximal femoral artery atherosclerotic stenosis. 2. Abnormal but nonspecific pelvis free fluid is stable from yesterday, with no bowel obstruction or bowel inflammation evident by CT. See also #3. 3. Progressive uterine enlargement and heterogeneity, especially the uterine fundus, from PET-CT last year. As stated yesterday it is difficult to exclude malignant transformation of uterine fibroid disease. Recommend GYN Surgery consultation. 4. No other acute or inflammatory process identified in the abdomen or pelvis. Electronically Signed   By: Odessa Fleming M.D.   On: 09/23/2022 05:16   CT Renal Stone Study  Result Date: 09/22/2022 CLINICAL DATA:  Abdominal/flank pain, stone suspected. EXAM: CT ABDOMEN AND PELVIS WITHOUT CONTRAST TECHNIQUE: Multidetector CT imaging of the abdomen and pelvis was performed following the standard protocol without IV contrast. RADIATION DOSE REDUCTION: This exam was performed according to the departmental dose-optimization program which includes automated exposure control, adjustment of the mA and/or kV according to patient size and/or use of iterative reconstruction technique. COMPARISON:  None Available. FINDINGS: Lower chest: No acute abnormality. Hepatobiliary: No focal liver abnormality is seen. No gallstones, gallbladder wall thickening, or biliary dilatation. Pancreas: Unremarkable. No pancreatic ductal dilatation or surrounding inflammatory changes. Spleen: Normal  in size without focal abnormality. Adrenals/Urinary Tract: Adrenal glands are unremarkable. 2 mm nonobstructing calculus in the interpolar region and another 4 mm calculus in the anterior aspect of the lower pole of the right kidney. Simple cysts in the lower pole of the left kidney. There are multiple 2-3 mm nonobstructing calculi in the left kidney. No evidence of hydronephrosis or ureteral calculus. Bladder is unremarkable. Stomach/Bowel: Stomach is within normal limits. Appendix appears normal. No evidence of bowel wall thickening, distention, or inflammatory changes. Vascular/Lymphatic: Aortic atherosclerosis. No enlarged abdominal or pelvic lymph nodes. Reproductive: There is a large heterogeneous uterus measuring at least 9.3 x 13.8 x 13.7 cm. This may represent a large multi fibroid uterus or a uterine carcinoma. Other: No abdominal wall hernia or abnormality. No abdominopelvic ascites. 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 No suspicious lesion or acute osseous abnormality Musculoskeletal: Multilevel degenerate disc disease of the lumbar spine. No acute osseous abnormality. IMPRESSION: 1. Large heterogeneous uterus measuring at least 9.3 x 13.8 x 13.7 cm, which may represent a large multi fibroid uterus or a uterine carcinoma. OBGYN consultation and further evaluation with MR examination with and without contrast is recommended. 2. Bilateral nonobstructing renal calculi measuring up to 4 mm in the lower pole of the right kidney and 2 mm in the interpolar region of the right kidney. No evidence of hydronephrosis or ureteral calculus. 3. Normal appendix. No evidence of colitis or diverticulitis. 4. Multilevel degenerate  disc disease of the lumbar spine. No acute osseous abnormality. Aortic Atherosclerosis (ICD10-I70.0). Electronically Signed   By: Larose Hires D.O.   On: 09/22/2022 23:41     ASSESSMENT/PLAN:  No problem-specific Assessment & Plan notes found for this encounter.   No  orders of the defined types were placed in this encounter.    I spent {CHL ONC TIME VISIT - ZOXWR:[id number]} counseling the patient face to face. The total time spent in the appointment was {CHL ONC TIME VISIT - BJYNW:[id number]}.  Kimisha Eunice L Ashana Tullo, PA-C 10/15/22

## 2022-10-16 ENCOUNTER — Inpatient Hospital Stay: Payer: Medicare Other

## 2022-10-16 ENCOUNTER — Inpatient Hospital Stay (HOSPITAL_BASED_OUTPATIENT_CLINIC_OR_DEPARTMENT_OTHER): Payer: Medicare Other | Admitting: Physician Assistant

## 2022-10-16 ENCOUNTER — Telehealth: Payer: Self-pay | Admitting: Internal Medicine

## 2022-10-16 DIAGNOSIS — C3411 Malignant neoplasm of upper lobe, right bronchus or lung: Secondary | ICD-10-CM

## 2022-10-16 NOTE — Telephone Encounter (Signed)
Scheduled per 07/17 scheduling message and wq, patient has been called and notified.

## 2022-10-17 MED FILL — Dexamethasone Sodium Phosphate Inj 100 MG/10ML: INTRAMUSCULAR | Qty: 1 | Status: AC

## 2022-10-17 MED FILL — Fosaprepitant Dimeglumine For IV Infusion 150 MG (Base Eq): INTRAVENOUS | Qty: 5 | Status: AC

## 2022-10-18 ENCOUNTER — Encounter: Payer: Self-pay | Admitting: Internal Medicine

## 2022-10-18 NOTE — Progress Notes (Unsigned)
[facility] OFFICE PROGRESS NOTE  Rodrigo Ran, MD [address] [place] [address]  DIAGNOSIS: Recurrent lung cancer initially diagnosed as stage IIIA (T1b, N2, M0) non-small cell lung cancer presented with right upper lobe lung nodule in addition to mediastinal lymphadenopathy diagnosed in January 2021.  She had some new lung nodules in the right upper lobe in June 2024   PRIOR THERAPY: 1) Concurrent chemoradiation with weekly carboplatin for AUC of 2 and paclitaxel 45 NG/M2.  Status post 5 cycles. Last dose received on 05/30/19. 2) Consolidation immunotherapy with Imfinzi 1500 mg/kg IV every 4 weeks.  First dose expected on 07/21/2019. Status post 13 cycles. 3) SBRT to the large right upper lobe pulmonary nodules under the care of Dr. Mitzi Hansen completed October 18, 2021.  CURRENT THERAPY:  Palliative systemic chemotherapy with carboplatin for an AUC of 5, paclitaxel 175 mg/m, and Keytruda 200 mg IV every 3 weeks.  First dose on 10/20/2022, will delay start date until 10/29/22 due to hypotension.   INTERVAL HISTORY: Betty Anderson 67 y.o. female returns to clinic today for follow-up visit accompanied by her husband.  The patient was last seen by Dr. Arbutus Ped and myself on 09/29/2022.  At that point time, the patient had a PET scan and CT scan that showed concerning findings for disease progression and metastatic disease.  Therefore, Dr. Arbutus Ped recommended systemic chemotherapy.  However, the patient had significant hypotension that day despite not being on any antihypertensives.  She was asymptomatic.  She went to the emergency room.  Her workup in the ED did not show any signs of dehydration and her hemoglobin was stable, her cardiac enzymes were normal, LDH was normal without any leukocytosis or signs of infection.  She was prescribed baclofen recently which could be contributing.  ED recommended having the patient stay in the hospital for observation but the patient states she felt  fine and preferred to go home.  She then presented on 10/09/2022 for her first cycle of treatment but continued to have significant hypotension.  She received IV fluids, but because of her hypotension, she was not able to receive treatment that day.  Her CT scans also showed abnormal uterus and she was referred to GYN and she has an appointment on 8/14.    Today, the patient continues to have hypotension.  She is asymptomatic.  She states that she ate enchiladas for dinner and 3 donut holes for breakfast.  She states she drinks plenty of fluid at home.  Per chart review, she had a echocardiogram in 2020 that showed a ejection fraction of 50 to 55% and some moderate aortic regurgitation.  Does not have a cardiologist.  She does see a vascular surgeon for her carotid endarterectomy and she has a follow up next month.   The patient purposely did not take any of her medications this morning and despite that she still has low blood pressure.  She not take any baclofen or Norco.  She is a little more anemic today.  She denies any visible bleeding or bruising.  She takes an 81 mg aspirin at baseline.  She denies any fevers or night sweats.  She reports that she always gets cold which is not new.  She reports similar baseline dyspnea on exertion.  Denies any cough, chest pain, or hemoptysis. She has not had any nausea or vomiting since she left the emergency room about a week ago. She struggles with constipation at baseline and takes Senokot. Denies any headache or  visual changes. She is here for evaluation and repeat blood work before considering cycle #1.       MEDICAL HISTORY: Past Medical History:  Diagnosis Date   COPD (chronic obstructive pulmonary disease) (HCC)    emphysema   Hyperlipidemia    Hypertension    lung ca dx'd 03/2019   Lung nodule    Paroxysmal atrial flutter (HCC)    per cardiology note   Peripheral artery disease (HCC)    Tobacco abuse    Vitamin D deficiency    Wears  glasses     ALLERGIES:  is allergic to lisinopril.  MEDICATIONS:  Current Outpatient Medications  Medication Sig Dispense Refill   aspirin EC 81 MG tablet Take 1 tablet (81 mg total) by mouth daily. 90 tablet 3   Cholecalciferol (DIALYVITE VITAMIN D 5000) 125 MCG (5000 UT) capsule Take 5,000 Units by mouth daily.     escitalopram (LEXAPRO) 10 MG tablet Take 10 mg by mouth daily.     guaiFENesin (MUCINEX) 600 MG 12 hr tablet Take by mouth 2 (two) times daily.     HYDROcodone-acetaminophen (NORCO) 10-325 MG tablet Take 1 tablet by mouth every 6 (six) hours as needed. 60 tablet 0   meloxicam (MOBIC) 7.5 MG tablet Take 1 tablet (7.5 mg total) by mouth in the morning and at bedtime. 60 tablet 0   ondansetron (ZOFRAN-ODT) 4 MG disintegrating tablet Take 1 tablet (4 mg total) by mouth every 8 (eight) hours as needed for nausea or vomiting. 20 tablet 0   pantoprazole (PROTONIX) 40 MG tablet TAKE ONE TABLET BY MOUTH TWICE A DAY 180 tablet 2   prochlorperazine (COMPAZINE) 10 MG tablet Take 1 tablet (10 mg total) by mouth every 6 (six) hours as needed. 30 tablet 2   rosuvastatin (CRESTOR) 20 MG tablet Take 20 mg by mouth every evening.      Tiotropium Bromide-Olodaterol (STIOLTO RESPIMAT) 2.5-2.5 MCG/ACT AERS Inhale 2 puffs into the lungs daily. 4 g 0   vitamin B-12 (CYANOCOBALAMIN) 1000 MCG tablet Take 1,000 mcg by mouth daily.     No current facility-administered medications for this visit.    SURGICAL HISTORY:  Past Surgical History:  Procedure Laterality Date   BRONCHIAL NEEDLE ASPIRATION BIOPSY  04/12/2019   Procedure: BRONCHIAL NEEDLE ASPIRATION BIOPSIES;  Surgeon: Josephine Igo, DO;  Location: [facility];  Service: Cardiopulmonary;;   ENDOBRONCHIAL ULTRASOUND N/A 04/12/2019   Procedure: ENDOBRONCHIAL ULTRASOUND;  Surgeon: Josephine Igo, DO;  Location: [facility];  Service: Cardiopulmonary;  Laterality: N/A;   OVARIAN CYST REMOVAL     TONSILLECTOMY     TUBAL LIGATION     VIDEO  BRONCHOSCOPY N/A 04/12/2019   Procedure: VIDEO BRONCHOSCOPY WITHOUT FLUORO;  Surgeon: Josephine Igo, DO;  Location: [facility];  Service: Cardiopulmonary;  Laterality: N/A;    REVIEW OF SYSTEMS:   Constitutional: Positive for fatigue. Negative for appetite change, chills, fever and unexpected weight change.  HENT:  Negative for mouth sores, nosebleeds, and sore throat.  Eyes: Negative for eye problems and icterus.  Respiratory: Positive for dyspnea on exertion. Negative for hemoptysis, cough, and wheezing.   Cardiovascular: Negative for chest pain and leg swelling.  Gastrointestinal: Positive occasional constipation (none at this time). Positive for ongoing abdominal pain (seeing gyn next month seen in Er x2 for this concern)  Negative for diarrhea, nausea and vomiting.  Genitourinary: Negative for bladder incontinence, difficulty urinating, dysuria, frequency and hematuria.   Musculoskeletal: Negative for back pain, gait problem, neck pain  and neck stiffness.  Skin: Negative for itching and rash.  Neurological: Negative for dizziness, extremity weakness, gait problem, headaches, light-headedness and seizures.  Hematological: Negative for adenopathy. Does not bruise/bleed easily.  Psychiatric/Behavioral: Negative for confusion, depression and sleep disturbance. The patient is not nervous/anxious.     PHYSICAL EXAMINATION:  Blood pressure (!) 69/38, pulse 82, temperature 97.7 F (36.5 C), temperature source Temporal, resp. rate 16, weight 155 lb 14.4 oz (70.7 kg), SpO2 100%.  ECOG PERFORMANCE STATUS: 1  Physical Exam  Constitutional: Oriented to person, place, and time and chronic appearing female and in no distress.  HENT:  Head: Normocephalic and atraumatic.  Mouth/Throat: Oropharynx is clear and moist. No oropharyngeal exudate.  Eyes: Conjunctivae are normal. Right eye exhibits no discharge. Left eye exhibits no discharge. No scleral icterus.  Neck: Normal range of motion. Neck  supple.  Cardiovascular: Normal rate, regular rhythm, quiet murmur noted. intact distal pulses.   Pulmonary/Chest: Effort normal and breath sounds normal. No respiratory distress. No wheezes. No rales.  Abdominal: Soft. Bowel sounds are normal. Exhibits no distension and no mass. There is no tenderness.  Musculoskeletal: No focal tenderness to palpation in her back. Normal range of motion. Exhibits no edema.  Lymphadenopathy:    No cervical adenopathy.  Neurological: Alert and oriented to person, place, and time. Exhibits muscle wasting. Gait normal. Coordination normal.  Skin: Skin is warm and dry. No rash noted. Not diaphoretic. No erythema. No pallor.  Psychiatric: Mood, memory and judgment normal.  Vitals reviewed.    LABORATORY DATA: Lab Results  Component Value Date   WBC 7.6 10/20/2022   HGB 9.7 (L) 10/20/2022   HCT 30.6 (L) 10/20/2022   MCV 96.8 10/20/2022   PLT 354 10/20/2022      Chemistry      Component Value Date/Time   NA 138 10/09/2022 0934   K 3.6 10/09/2022 0934   CL 106 10/09/2022 0934   CO2 24 10/09/2022 0934   BUN 13 10/09/2022 0934   CREATININE 1.37 (H) 10/09/2022 0934      Component Value Date/Time   CALCIUM 9.0 10/09/2022 0934   ALKPHOS 77 10/09/2022 0934   AST 9 (L) 10/09/2022 0934   ALT 5 10/09/2022 0934   BILITOT 0.4 10/09/2022 0934       RADIOGRAPHIC STUDIES:  NM PET Image Restage (PS) Skull Base to Thigh (F-18 FDG)  Result Date: 09/29/2022 CLINICAL DATA:  Subsequent treatment strategy for non-small cell lung cancer. EXAM: NUCLEAR MEDICINE PET SKULL BASE TO THIGH TECHNIQUE: 7.92 mCi F-18 FDG was injected intravenously. Full-ring PET imaging was performed from the skull base to thigh after the radiotracer. CT data was obtained and used for attenuation correction and anatomic localization. Fasting blood glucose: 106 mg/dl COMPARISON:  CT from 82/95/6213 and PET-CT from 09/05/2021 FINDINGS: Mediastinal blood pool activity: SUV max 2.98 Liver  activity: SUV max NA NECK: No hypermetabolic lymph nodes in the neck. Incidental CT findings: None CHEST: No tracer avid mediastinal, hilar, axillary, or supraclavicular lymph nodes. Within the area of radiation change involving the right upper lobe there is a focal area of increased uptake identified medially which measures 1.5 x 1.2 with SUV max of 10.78, image 48/4. Within the anterior right upper lobe there is a small focus increased radiotracer uptake with SUV max of 4.97. This corresponds to a subpleural non solid nodular density measuring 1.2 by 0.9 cm, image 50/4. 4 mm nodule in the superior segment of the right lower lobe is stable, image 21/7.  This is too small to characterize by PET-CT. Stable faint nodule in the superior segment of left lower lobe measuring 4 mm, image 26/7. Unchanged from previous exam. Incidental CT findings: Emphysema. Aortic atherosclerosis and coronary artery calcifications. ABDOMEN/PELVIS: No abnormal hypermetabolic activity within the liver, pancreas, adrenal glands, or spleen. No hypermetabolic lymph nodes in the abdomen or pelvis. Incidental CT findings: Aortic atherosclerotic calcifications. Diffusely enlarged uterus is again noted without internal increased uptake. This measures 13.7 x 10.2 cm, image 147/4. Formally this measured 14.7 by 8.2 cm. SKELETON: No focal hypermetabolic activity to suggest skeletal metastasis. Incidental CT findings: None. IMPRESSION: 1. There is a focal area of increased radiotracer uptake identified within the medial aspect of the radiation change involving the right upper lobe. SUV max is equal to 10.78. This is a new finding when compared with the prior PET-CT from 06/08/202. This is moderately suspicious for locally recurrent tumor. 2. There is a smaller focus of increased radiotracer uptake identified within the anterior right upper lobe corresponding to a non solid nodular density measuring 1.2 by 0.9 cm. This is nonspecific and could represent  an area of inflammation. 3. Unchanged tiny nodules within the superior segment right lower lobe and superior segment of left lower lobe. These are too small to characterize by PET-CT but appear unchanged when compared with 09/01/2022. 4. No evidence of metastatic disease within the abdomen or pelvis. 5. Diffusely enlarged uterus is again noted without internal increased uptake. This measures 13.7 x 10.2 cm. Formally this measured 14.7 by 8.2 cm. Consider correlation with pelvic sonogram. 6. Aortic Atherosclerosis (ICD10-I70.0) and Emphysema (ICD10-J43.9). Electronically Signed   By: Signa Kell M.D.   On: 09/29/2022 10:13   CT Angio Abd/Pel W and/or Wo Contrast  Result Date: 09/23/2022 CLINICAL DATA:  67 year old female with lower abdominal pain radiating to the back, increasing over the last couple of days. History of non-small cell lung cancer. EXAM: CTA ABDOMEN AND PELVIS WITHOUT AND WITH CONTRAST TECHNIQUE: Multidetector CT imaging of the abdomen and pelvis was performed using the standard protocol during bolus administration of intravenous contrast. Multiplanar reconstructed images and MIPs were obtained and reviewed to evaluate the vascular anatomy. RADIATION DOSE REDUCTION: This exam was performed according to the departmental dose-optimization program which includes automated exposure control, adjustment of the mA and/or kV according to patient size and/or use of iterative reconstruction technique. CONTRAST:  75mL OMNIPAQUE IOHEXOL 350 MG/ML SOLN COMPARISON:  Noncontrast CT Abdomen and Pelvis yesterday. PET-CT 09/05/2021. FINDINGS: VASCULAR Extensive Aortoiliac calcified atherosclerosis. 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 SMA atherosclerosis is comparatively mild. IMA remains patent. Atherosclerotic bilateral iliac and proximal femoral arteries remain patent, but there is moderate to severe bilateral superficial  femoral artery stenosis (series 4, image 203 on the right). On the delayed phase images the portal venous system is patent. Review of the MIP images confirms the above findings. NON-VASCULAR Lower chest: Improved lung volumes but otherwise stable 2 PET-CT last year. No cardiomegaly, pericardial effusion, pleural effusion. Hepatobiliary: Small volume perihepatic simple density free fluid yesterday does not persist. Liver and gallbladder appear negative. Pancreas: Negative. Spleen: Negative. Adrenals/Urinary Tract: Adrenal glands are stable from the PET-CT last year, within normal limits. Chronic renal cortical scarring more pronounced on the right. Nonobstructed kidneys with symmetric renal enhancement. Renal vascular calcifications. Nephrolithiasis difficult to exclude. Exophytic left renal cyst was benign by PET-CT last year (no follow-up imaging recommended). Mildly to moderately  distended urinary bladder with mild mass effect from the uterus, but otherwise unremarkable. Incidental pelvic phleboliths. Stomach/Bowel: No dilated large or small bowel. Redundant right colon with cecum on a lax mesentery. Diminutive or absent appendix. No discrete bowel inflammation. Retained fluid in the stomach. Duodenum is decompressed. No free air. No free fluid identified in the abdomen now. Lymphatic: No lymphadenopathy identified. Reproductive: Heterogeneous large and lobulated uterus redemonstrated, with uterine fundal enlargement up to 15 cm transverse significantly progressed from PET-CT last year. Benign FDG appearance of the uterus at that time (multiple fibroids). No adnexal enlargement is evident. Other: Small volume of pelvis free fluid, in the cul-de-sac with simple fluid density series 18, image 69. This is unchanged from yesterday, new or increased from the PET-CT last year. Musculoskeletal: No acute or suspicious osseous lesion identified. IMPRESSION: 1. Severe Aortic Atherosclerosis (ICD10-I70.0), but mesenteric  vasculature remains patent. 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 Bilateral proximal femoral artery atherosclerotic stenosis. 2. Abnormal but nonspecific pelvis free fluid is stable from yesterday, with no bowel obstruction or bowel inflammation evident by CT. See also #3. 3. Progressive uterine enlargement and heterogeneity, especially the uterine fundus, from PET-CT last year. As stated yesterday it is difficult to exclude malignant transformation of uterine fibroid disease. Recommend GYN Surgery consultation. 4. No other acute or inflammatory process identified in the abdomen or pelvis. Electronically Signed   By: Odessa Fleming M.D.   On: 09/23/2022 05:16   CT Renal Stone Study  Result Date: 09/22/2022 CLINICAL DATA:  Abdominal/flank pain, stone suspected. EXAM: CT ABDOMEN AND PELVIS WITHOUT CONTRAST TECHNIQUE: Multidetector CT imaging of the abdomen and pelvis was performed following the standard protocol without IV contrast. RADIATION DOSE REDUCTION: This exam was performed according to the departmental dose-optimization program which includes automated exposure control, adjustment of the mA and/or kV according to patient size and/or use of iterative reconstruction technique. COMPARISON:  None Available. FINDINGS: Lower chest: No acute abnormality. Hepatobiliary: No focal liver abnormality is seen. No gallstones, gallbladder wall thickening, or biliary dilatation. Pancreas: Unremarkable. No pancreatic ductal dilatation or surrounding inflammatory changes. Spleen: Normal in size without focal abnormality. Adrenals/Urinary Tract: Adrenal glands are unremarkable. 2 mm nonobstructing calculus in the interpolar region and another 4 mm calculus in the anterior aspect of the lower pole of the right kidney. Simple cysts in the lower pole of the left kidney. There are multiple 2-3 mm nonobstructing calculi in the left kidney. No evidence of hydronephrosis or ureteral calculus. Bladder is unremarkable.  Stomach/Bowel: Stomach is within normal limits. Appendix appears normal. No evidence of bowel wall thickening, distention, or inflammatory changes. Vascular/Lymphatic: Aortic atherosclerosis. No enlarged abdominal or pelvic lymph nodes. Reproductive: There is a large heterogeneous uterus measuring at least 9.3 x 13.8 x 13.7 cm. This may represent a large multi fibroid uterus or a uterine carcinoma. Other: No abdominal wall hernia or abnormality. No abdominopelvic ascites. 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 No suspicious lesion or acute osseous abnormality Musculoskeletal: Multilevel degenerate disc disease of the lumbar spine. No acute osseous abnormality. IMPRESSION: 1. Large heterogeneous uterus measuring at least 9.3 x 13.8 x 13.7 cm, which may represent a large multi fibroid uterus or a uterine carcinoma. OBGYN consultation and further evaluation with MR examination with and without contrast is recommended. 2. Bilateral nonobstructing renal calculi measuring up to 4 mm in the lower pole of the right kidney and 2 mm in the interpolar region of the right kidney. No evidence of hydronephrosis or ureteral  calculus. 3. Normal appendix. No evidence of colitis or diverticulitis. 4. Multilevel degenerate disc disease of the lumbar spine. No acute osseous abnormality. Aortic Atherosclerosis (ICD10-I70.0). Electronically Signed   By: Larose Hires D.O.   On: 09/22/2022 23:41     ASSESSMENT/PLAN:  This is a very pleasant 67 year old Caucasian female with recurrent non-small cell lung cancer initially diagnosed as stage IIIa non-small cell lung cancer who presented with a right upper lobe lung nodule and mediastinal lymphadenopathy.  She was diagnosed in January 2021.  New right lung nodules in June 2024   She completed course of concurrent chemoradiation with weekly carboplatin for an AUC of 2 and flexion of Taxol 45 mg/m.  She is status post 5 cycles and tolerated treatment well with a partial  response.   She then underwent consolidation immunotherapy with Imfinzi 1500 mg IV every 4 weeks status post 13 cycles.   Her CT scan of the chest on Aug 22, 2021 showed no concerning findings for disease progression except for enlargement of the nodular area along the pleural surface in the right upper lobe concerning for disease recurrence this was followed by a PET scan on 09/05/2021 and that showed increased radiotracer uptake in the nodular density within the right upper lobe concerning for new site of metabolically active tumor. The patient underwent curative radiotherapy to this lesion with SBRT under the care of Dr. Mitzi Hansen completed on October 18, 2021.    Her CT scan in June 2024 showed increase in size of the density of the right lung and increase of other pulmonary nodules in the right lung concerning for disease progression. Her PET scan showed increased radiotracer uptake in the medial aspect of the right upper lobe which is suspicious for locally recurrent tumor and a small focus of increased uptake in the anterior right upper lobe is suspicious and could be inflammation and other tiny nodules that are below size threshold.   Dr. Arbutus Ped recommended systemic chemotherapy with carboplatin for an AUC of 5, Taxol 175 mg/m mg/m, and Keytruda 200 mg IV every 3 weeks.  The patient is interested in proceeding with systemic chemotherapy.  She was unable to get her first dose on 10/09/22 as scheduled due to hypotension.   She was seen in the ER without any signs of dehydration, signs of infection, anemia, or cardiac enzyme abnormalities.   Today, her BP is 69/38.  She states she is eating and drinking well.  She denies any symptoms.   The patient was seen with Dr. Arbutus Ped.  Unfortunately, we are unable to start the patient's treatment due to her hypotension as it is too risky to administer chemotherapy with her hypotension.  We will urgently refer her to cardiology for further workup.  She does have  an echocardiogram from 2020 showing EF of 50 to 55% and some moderate aortic regurgitation.  The patient understands if she develops any syncope, lightheadedness, dizziness, chest pain, weakness, etc. to seek emergency room evaluation.  I strongly encouraged the patient to consider staying in the clinic today to receive additional IV fluids but she declined.  She states she will hydrate p.o. at home.  She understands our recommendation would be to stay and receive IV fluid.  She was referred to GYN for her diffusely enlarged uterus. She has an appointment on 11/12/22  We will see her back in 1 week for evaluation and considering starting treatment at that time.  She is a little more anemic today compared to her prior appointments.  I will arrange for iron studies, ferritin, B12, and folate to be drawn at her next lab visit to see if there is any correctable nutritional deficiencies contributing to her anemia.  The patient was advised to call immediately if she has any concerning symptoms in the interval. The patient voices understanding of current disease status and treatment options and is in agreement with the current care plan. All questions were answered. The patient knows to call the clinic with any problems, questions or concerns. We can certainly see the patient much sooner if necessary   Orders Placed This Encounter  Procedures   CBC with Differential (Cancer Center Only)    Standing Status:   Future    Standing Expiration Date:   10/29/2023   CMP (Cancer Center only)    Standing Status:   Future    Standing Expiration Date:   10/29/2023   T4    Standing Status:   Future    Standing Expiration Date:   10/29/2023   TSH    Standing Status:   Future    Standing Expiration Date:   10/29/2023      Khalil Belote L Jennavie Martinek, PA-C 10/20/22  ADDENDUM: Hematology/Oncology Attending: I had a face-to-face encounter with the patient today.  I reviewed her records, lab, and recommended  her care plan.  This is a very pleasant 67 years old white female with recurrent non-small cell lung cancer that was initially diagnosed as a stage IIIa in January 2021 and had evidence for disease progression in June 2024.  She was treated in the past with a course of concurrent chemoradiation followed by 1 year of consolidation treatment with immunotherapy and SBRT to right upper lobe pulmonary nodules.  The patient was supposed to start systemic chemotherapy with carboplatin, paclitaxel and Keytruda but unfortunately her chemo has been delayed because of persistent hypotension with systolic blood pressure in the 60-70s. I recommended for the patient to see cardiology for further evaluation of her hypertension and to rule out any underlying cardiac condition.  If her hypotension improved or received recommendation from cardiology to proceed with the treatment, we will consider starting her systemic chemotherapy soon. The patient is in agreement with the current plan. She was advised to call immediately if she has any other concerning symptoms in the interval. The total time spent in the appointment was 30 minutes. Disclaimer: This note was dictated with voice recognition software. Similar sounding words can inadvertently be transcribed and may be missed upon review. Lajuana Matte, MD

## 2022-10-20 ENCOUNTER — Telehealth: Payer: Self-pay | Admitting: Medical Oncology

## 2022-10-20 ENCOUNTER — Inpatient Hospital Stay: Payer: Medicare Other | Admitting: Physician Assistant

## 2022-10-20 ENCOUNTER — Inpatient Hospital Stay: Payer: Medicare Other

## 2022-10-20 ENCOUNTER — Other Ambulatory Visit: Payer: Self-pay

## 2022-10-20 ENCOUNTER — Other Ambulatory Visit: Payer: Self-pay | Admitting: Physician Assistant

## 2022-10-20 VITALS — BP 69/38 | HR 82 | Temp 97.7°F | Resp 16 | Wt 155.9 lb

## 2022-10-20 DIAGNOSIS — C3411 Malignant neoplasm of upper lobe, right bronchus or lung: Secondary | ICD-10-CM

## 2022-10-20 DIAGNOSIS — D649 Anemia, unspecified: Secondary | ICD-10-CM

## 2022-10-20 LAB — CBC WITH DIFFERENTIAL (CANCER CENTER ONLY)
Abs Immature Granulocytes: 0.02 10*3/uL (ref 0.00–0.07)
Basophils Absolute: 0.1 10*3/uL (ref 0.0–0.1)
Basophils Relative: 1 %
Eosinophils Absolute: 0.3 10*3/uL (ref 0.0–0.5)
Eosinophils Relative: 4 %
HCT: 30.6 % — ABNORMAL LOW (ref 36.0–46.0)
Hemoglobin: 9.7 g/dL — ABNORMAL LOW (ref 12.0–15.0)
Immature Granulocytes: 0 %
Lymphocytes Relative: 13 %
Lymphs Abs: 1 10*3/uL (ref 0.7–4.0)
MCH: 30.7 pg (ref 26.0–34.0)
MCHC: 31.7 g/dL (ref 30.0–36.0)
MCV: 96.8 fL (ref 80.0–100.0)
Monocytes Absolute: 0.5 10*3/uL (ref 0.1–1.0)
Monocytes Relative: 7 %
Neutro Abs: 5.8 10*3/uL (ref 1.7–7.7)
Neutrophils Relative %: 75 %
Platelet Count: 354 10*3/uL (ref 150–400)
RBC: 3.16 MIL/uL — ABNORMAL LOW (ref 3.87–5.11)
RDW: 14.1 % (ref 11.5–15.5)
WBC Count: 7.6 10*3/uL (ref 4.0–10.5)
nRBC: 0 % (ref 0.0–0.2)

## 2022-10-20 LAB — CMP (CANCER CENTER ONLY)
ALT: 5 U/L (ref 0–44)
AST: 9 U/L — ABNORMAL LOW (ref 15–41)
Albumin: 3.4 g/dL — ABNORMAL LOW (ref 3.5–5.0)
Alkaline Phosphatase: 67 U/L (ref 38–126)
Anion gap: 5 (ref 5–15)
BUN: 20 mg/dL (ref 8–23)
CO2: 27 mmol/L (ref 22–32)
Calcium: 9.2 mg/dL (ref 8.9–10.3)
Chloride: 105 mmol/L (ref 98–111)
Creatinine: 1.35 mg/dL — ABNORMAL HIGH (ref 0.44–1.00)
GFR, Estimated: 43 mL/min — ABNORMAL LOW (ref >60)
Glucose, Bld: 105 mg/dL — ABNORMAL HIGH (ref 70–99)
Potassium: 4.1 mmol/L (ref 3.5–5.1)
Sodium: 137 mmol/L (ref 135–145)
Total Bilirubin: 0.3 mg/dL (ref 0.3–1.2)
Total Protein: 6.8 g/dL (ref 6.5–8.1)

## 2022-10-20 LAB — TSH: TSH: 2.108 u[IU]/mL (ref 0.350–4.500)

## 2022-10-20 NOTE — Telephone Encounter (Signed)
LVM on husband"s phone  about Cassie's information on cardiology referral. Spoke to dtr about reason she was referred to Dr Nadara Eaton.

## 2022-10-20 NOTE — Telephone Encounter (Signed)
She said to send her cardiology referral to Dr Corky Crafts @ [facility] not Dr Jacinto Halim.

## 2022-10-20 NOTE — Telephone Encounter (Signed)
I called pt ,but no answer "call unable to be completed at this time".   Pt needs to know that [place] cannot see her until sept and Jacinto Halim can see her this week "  Cassie called Dr. Eldridge Dace last week for urgent cardiology referral and they aren't seeing new patients until September. Jacinto Halim can work them in this week.

## 2022-10-21 ENCOUNTER — Ambulatory Visit: Payer: Medicare Other | Admitting: Cardiology

## 2022-10-21 ENCOUNTER — Encounter: Payer: Self-pay | Admitting: Cardiology

## 2022-10-21 VITALS — BP 70/50 | HR 84 | Resp 17 | Ht 63.0 in | Wt 155.0 lb

## 2022-10-21 DIAGNOSIS — I739 Peripheral vascular disease, unspecified: Secondary | ICD-10-CM | POA: Insufficient documentation

## 2022-10-21 DIAGNOSIS — I6522 Occlusion and stenosis of left carotid artery: Secondary | ICD-10-CM | POA: Insufficient documentation

## 2022-10-21 DIAGNOSIS — I771 Stricture of artery: Secondary | ICD-10-CM | POA: Insufficient documentation

## 2022-10-21 DIAGNOSIS — Z66 Do not resuscitate: Secondary | ICD-10-CM | POA: Insufficient documentation

## 2022-10-21 LAB — T4: T4, Total: 8.4 ug/dL (ref 4.5–12.0)

## 2022-10-21 NOTE — Progress Notes (Signed)
Primary Physician/Referring:  Rodrigo Ran, MD  [name], female    DOB: [birth year], 67 y.o.   MRN: [id number]  Chief Complaint  Patient presents with   Hypotension   New Patient (Initial Visit)   PAD   HPI:    Betty Anderson  is a 67 y.o. Patient referred to me on urgent basis for prechemotherapy evaluation, patient with a diagnosis of non-small cell lung cancer in January 2021, she now has metastatic disease with new lung nodules in the right upper lobe.  She was started on chemotherapy with carboplatin, paclitaxel and Keytruda however after first dose on 10/20/2022, continued to have severe episode of hypotension.  Past medical history significant for hypercholesterolemia, primary hypertension, peripheral arterial disease with subclavian and carotid stenosis and 1 documented episode of atrial flutter in 2020 with no recurrence, trace aortic stenosis and moderate aortic regurgitation.    Patient is completely asymptomatic and denies any symptoms of claudication, dizziness or syncope.  Except for chronic dyspnea, no PND or orthopnea.  She is accompanied by her husband.  Past Medical History:  Diagnosis Date   COPD (chronic obstructive pulmonary disease) (HCC)    emphysema   Hyperlipidemia    Hypertension    lung ca dx'd 03/2019   Lung nodule    Paroxysmal atrial flutter (HCC)    per cardiology note   Peripheral artery disease (HCC)    Tobacco abuse    Vitamin D deficiency    Wears glasses    Past Surgical History:  Procedure Laterality Date   BRONCHIAL NEEDLE ASPIRATION BIOPSY  04/12/2019   Procedure: BRONCHIAL NEEDLE ASPIRATION BIOPSIES;  Surgeon: Josephine Igo, DO;  Location: [facility];  Service: Cardiopulmonary;;   ENDOBRONCHIAL ULTRASOUND N/A 04/12/2019   Procedure: ENDOBRONCHIAL ULTRASOUND;  Surgeon: Josephine Igo, DO;  Location: [facility];  Service: Cardiopulmonary;  Laterality: N/A;   OVARIAN CYST REMOVAL     TONSILLECTOMY     TUBAL  LIGATION     VIDEO BRONCHOSCOPY N/A 04/12/2019   Procedure: VIDEO BRONCHOSCOPY WITHOUT FLUORO;  Surgeon: Josephine Igo, DO;  Location: [facility];  Service: Cardiopulmonary;  Laterality: N/A;   Family History  Problem Relation Age of Onset   Stroke Mother    Stroke Father    Lung cancer Father    Mesothelioma Brother    Lung cancer Sister    Colon cancer Sister    Anuerysm Brother     Social History   Tobacco Use   Smoking status: Every Day    Current packs/day: 3.50    Average packs/day: 3.5 packs/day for 60.6 years (212.0 ttl pk-yrs)    Types: Cigarettes    Start date: 03/31/1962   Smokeless tobacco: Never   Tobacco comments:    Pt currently smoking 2ppd  Substance Use Topics   Alcohol use: Not Currently    Alcohol/week: 8.0 standard drinks of alcohol    Types: 8 Shots of liquor per week   Marital Status: Married  ROS  Review of Systems  Cardiovascular:  Positive for dyspnea on exertion. Negative for chest pain and leg swelling.   Objective      10/21/2022   11:47 AM 10/20/2022    8:18 AM 10/09/2022    1:17 PM  Vitals with BMI  Height 5\' 3"     Weight 155 lbs 155 lbs 14 oz   BMI 27.46 27.62   Systolic 76 69 75  Diastolic 53 38 52  Pulse 84 82    Blood  pressure (!) 76/53, pulse 84, resp. rate 17, height 5\' 3"  (1.6 m), weight 155 lb (70.3 kg), SpO2 (!) 82%.  Orthostatic VS for the past 72 hrs (Last 3 readings):  Orthostatic BP Patient Position BP Location Cuff Size Orthostatic Pulse  10/21/22 1157 92/46 Standing Left Arm Normal 58  10/21/22 1156 90/58 Sitting Left Arm Normal 60  10/21/22 1155 96/57 Supine Left Arm Normal 82    Physical Exam Neck:     Vascular: Carotid bruit (bilateral) present. No JVD.  Cardiovascular:     Rate and Rhythm: Normal rate and regular rhythm.     Pulses:          Radial pulses are 0 on the right side and 1+ on the left side.       Femoral pulses are 1+ on the right side with bruit and 0 on the left side with bruit.       Popliteal pulses are 0 on the right side and 0 on the left side.       Dorsalis pedis pulses are 0 on the right side and 0 on the left side.       Posterior tibial pulses are 0 on the right side and 0 on the left side.     Heart sounds: Normal heart sounds. No murmur heard.    No gallop.  Pulmonary:     Effort: Pulmonary effort is normal.     Breath sounds: Normal breath sounds.  Abdominal:     General: Bowel sounds are normal.     Palpations: Abdomen is soft.     Comments: Prominent bruit  Musculoskeletal:     Right lower leg: No edema.     Left lower leg: No edema.    Laboratory examination:   Recent Labs    09/29/22 1639 10/09/22 0934 10/20/22 0730  NA 137 138 137  K 3.4* 3.6 4.1  CL 100 106 105  CO2 25 24 27   GLUCOSE 89 107* 105*  BUN 20 13 20   CREATININE 1.32* 1.37* 1.35*  CALCIUM 9.3 9.0 9.2  GFRNONAA 44* 42* 43*    Lab Results  Component Value Date   GLUCOSE 105 (H) 10/20/2022   NA 137 10/20/2022   K 4.1 10/20/2022   CL 105 10/20/2022   CO2 27 10/20/2022   BUN 20 10/20/2022   CREATININE 1.35 (H) 10/20/2022   GFRNONAA 43 (L) 10/20/2022   CALCIUM 9.2 10/20/2022   PROT 6.8 10/20/2022   ALBUMIN 3.4 (L) 10/20/2022   BILITOT 0.3 10/20/2022   ALKPHOS 67 10/20/2022   AST 9 (L) 10/20/2022   ALT 5 10/20/2022   ANIONGAP 5 10/20/2022      Lab Results  Component Value Date   ALT 5 10/20/2022   AST 9 (L) 10/20/2022   ALKPHOS 67 10/20/2022   BILITOT 0.3 10/20/2022       Latest Ref Rng & Units 10/20/2022    7:30 AM 10/09/2022    9:34 AM 09/29/2022    4:39 PM  CBC  WBC 4.0 - 10.5 K/uL 7.6  11.1  10.2   Hemoglobin 12.0 - 15.0 g/dL 9.7  32.4  40.1   Hematocrit 36.0 - 46.0 % 30.6  33.2  35.1   Platelets 150 - 400 K/uL 354  436  338        Latest Ref Rng & Units 10/20/2022    7:30 AM 10/09/2022    9:34 AM 09/23/2022    3:51 AM  Hepatic Function  Total  Protein 6.5 - 8.1 g/dL 6.8  7.3  8.6   Albumin 3.5 - 5.0 g/dL 3.4  3.3  4.0   AST 15 - 41 U/L 9  9  15     ALT 0 - 44 U/L 5  5  12    Alk Phosphatase 38 - 126 U/L 67  77  78   Total Bilirubin 0.3 - 1.2 mg/dL 0.3  0.4  0.6   Bilirubin, Direct 0.0 - 0.2 mg/dL   <5.7   TSH Recent Labs    10/09/22 0934 10/20/22 0730  TSH 2.414 2.108   External labs:   Cholesterol, total 174.000 m 04/02/2021 HDL 48.000 mg 04/02/2021 LDL 107.000 m 04/02/2021 Triglycerides 95.000 mg 04/02/2021  Radiology:   CT angiogram of the abdomen and and pelvis on 09/23/2022 and CT scan of the chest 09/01/2022:  Revealed presence of severe abdominal aortic atherosclerotic disease without significant mesenteric involvement however there was bilateral proximal femoral artery stenosis noted.   CT scan of the chest also has reviewed LAD and circumflex atherosclerosis along with thoracic aortic atherosclerosis.  Cardiac Studies:   Echocardiogram 03/05/2019: Left ventricle cavity is normal in size. Moderate concentric hypertrophy of the left ventricle. Normal LV systolic function with visual EF 50-55%. Normal global wall motion. Doppler evidence of grade II (pseudonormal) diastolic dysfunction, elevated LAP. Calculated EF 47%. Trileaflet aortic valve with mild aortic valve leaflet calcification. Trace aortic stenosis. Moderate (Grade II) aortic regurgitation. Inadequate TR jet to estimate pulmonary artery systolic pressure. Estimated RA pressure 8 mmHg.  Event monitor 03/03/2019 - 04/01/2019: Diagnostic time: 90%  Dominant rhythm: Sinus. HR 58-104 bpm. Avg HR 71 bpm. No atrial fibrillation/atrial flutter/SVT/VT/high grade AV block, sinus pause >3sec noted. Symptoms of chest pain, flutter sensation correlated with sinus rhythm. No arrhythmia noted to explain patient's symptoms  Carotid artery duplex 05/07/2022: Right Carotid: Velocities in the right ICA are consistent with a 1-39% stenosis. Non-hemodynamically significant plaque <50% noted in the CCA. The ECA appears <50% stenosed. Left Carotid: Velocities in the left ICA are consistent  with a 60-79% stenosis.  Non-hemodynamically significant plaque <50% noted in the CCA. The ECA appears <50% stenosed. Elevated proximal LEFT CCA velocity suggestive of stenosis but  unable to obtain more proximal velocity to determine PSV ratio according to our criteria. Vertebrals:  Abnormal RETROgrade RIGHT vertebral artery waveform suggestive of  proximal obstruction consistent with subclavian STEAL.  Abnormal Bidirectional LEFT vertebral artery waveform suggestive of incomplete subclavian STEAL. Subclavians: Left subclavian artery was stenotic. Right Subclavian artery is blunted and turbulent suggestive of more proximal stenosis. No significant change from 02/11/2019  EKG:   EKG  10/21/2022:  Sinus rhythm with first-degree AV block  At the rate of 83 bpm, left atrial enlargement,  Normal axis.  Compared to 02/22/2019,  Atrial flutter with 2: 1 conduction not present.    EKG 02/22/2019 1613: Typical atrial flutter with RVR 160 bpm. Nonspecific ST-T changes. Medications and allergies   Allergies  Allergen Reactions   Lisinopril Cough    Medication list   Current Outpatient Medications:    aspirin EC 81 MG tablet, Take 1 tablet (81 mg total) by mouth daily., Disp: 90 tablet, Rfl: 3   Cholecalciferol (DIALYVITE VITAMIN D 5000) 125 MCG (5000 UT) capsule, Take 5,000 Units by mouth daily., Disp: , Rfl:    escitalopram (LEXAPRO) 10 MG tablet, Take 10 mg by mouth daily., Disp: , Rfl:    guaiFENesin (MUCINEX) 600 MG 12 hr tablet, Take by mouth 2 (two) times  daily., Disp: , Rfl:    HYDROcodone-acetaminophen (NORCO) 10-325 MG tablet, Take 1 tablet by mouth every 6 (six) hours as needed., Disp: 60 tablet, Rfl: 0   meloxicam (MOBIC) 7.5 MG tablet, Take 1 tablet (7.5 mg total) by mouth in the morning and at bedtime., Disp: 60 tablet, Rfl: 0   ondansetron (ZOFRAN-ODT) 4 MG disintegrating tablet, Take 1 tablet (4 mg total) by mouth every 8 (eight) hours as needed for nausea or vomiting., Disp: 20  tablet, Rfl: 0   pantoprazole (PROTONIX) 40 MG tablet, TAKE ONE TABLET BY MOUTH TWICE A DAY, Disp: 180 tablet, Rfl: 2   prochlorperazine (COMPAZINE) 10 MG tablet, Take 1 tablet (10 mg total) by mouth every 6 (six) hours as needed., Disp: 30 tablet, Rfl: 2   rosuvastatin (CRESTOR) 20 MG tablet, Take 20 mg by mouth every evening. , Disp: , Rfl:    vitamin B-12 (CYANOCOBALAMIN) 1000 MCG tablet, Take 1,000 mcg by mouth daily., Disp: , Rfl:    Tiotropium Bromide-Olodaterol (STIOLTO RESPIMAT) 2.5-2.5 MCG/ACT AERS, Inhale 2 puffs into the lungs daily. (Patient not taking: Reported on 10/21/2022), Disp: 4 g, Rfl: 0  Assessment     ICD-10-CM   1. Iatrogenic hypotension  I95.89 EKG 12-Lead    2. Steal syndrome, subclavian  G45.8     3. PAD (peripheral artery disease) (HCC)  I73.9     4. Primary hypertension  I10     5. Asymptomatic carotid artery stenosis without infarction, left  I65.22        Orders Placed This Encounter  Procedures   EKG 12-Lead   No orders of the defined types were placed in this encounter.  There are no discontinued medications.   Recommendations:   Betty Anderson is a 67 y.o.  Patient referred to me on urgent basis for prechemotherapy evaluation, patient with a diagnosis of non-small cell lung cancer in January 2021, she now has metastatic disease with new lung nodules in the right upper lobe.  She was started on chemotherapy with carboplatin, paclitaxel and Keytruda however after first dose on 10/20/2022, continued to have severe episode of hypotension.  Past medical history significant for hypercholesterolemia, primary hypertension, peripheral arterial disease with subclavian and carotid stenosis and 1 documented episode of atrial flutter in 2020 with no recurrence, trace aortic stenosis and moderate aortic regurgitation.    1. Subclavian arterial stenosis (HCC) Patient has severe subclavian artery stenosis bilaterally, I reviewed her chart extensively, the low  blood pressure is false.  She probably has very high central circulatory pressure that is keeping her perfusion to the organs including brain, upper extremity and lower extremity.  - EKG 12-Lead  2. Asymptomatic carotid artery stenosis without infarction, left Reviewed her carotid artery duplex as well, again she remains asymptomatic.  She has fairly high-grade stenosis in the left carotid artery especially.  She is presently on aspirin, Crestor, continue the same.  3. PAD (peripheral artery disease) (HCC) I reviewed her abdominal CT, she also has bilateral lower extremity arterial disease, she has absent pulses in both lower extremities, however she has no symptoms of critical limb ischemia or symptoms of claudication.  Medical therapy only.  She also has a very prominent abdominal bruit, she has extensive atherosclerosis involving the abdominal aorta and also the mesenteric vessels. - EKG 12-Lead  4. DNR (do not resuscitate) Patient's vascular issues are not treatable.  She would certainly have extreme high risk of complications trying to attempt any kind of vascular interventions.  She probably  also has significant coronary artery disease as evidenced by CT scan which reveals two-vessel coronary disease and calcification.  In view of this I am very concerned about chemotherapy, which can potentially induce hypotension and cause endorgan damage and/or stroke or mesenteric ischemia.  I had an extensive discussion with the patient herself and also her husband who is present at the bedside regarding goals of therapy.  After discussions, patient is completely aware that the chemotherapy is only for palliation.  At this point she feels she probably should forego any chemotherapy and consider only palliative care and potentially hospice care if necessary.  ACP documents were created by me today, patient was made DNR.  I will forward my note to her medical team for consideration.  In case they  decide to proceed with chemotherapy, from cardiac standpoint I would not worry about the blood pressure and treat appropriately and treat appropriately.  The blood pressure is always gone to be low in view of severe bilateral subclavian artery stenosis and also in the lower extremity he will not get adequate blood pressure as she has abdominal aortic stenosis and also lower extremity arterial disease.  This was a >60-minute office visit encounter with additional 25 minutes of end-of-life discussions.   Yates Decamp, MD, [facility] 10/21/2022, 12:06 PM Office: [phone]

## 2022-10-22 ENCOUNTER — Ambulatory Visit: Payer: Medicare Other

## 2022-10-23 ENCOUNTER — Other Ambulatory Visit: Payer: Medicare Other

## 2022-10-23 NOTE — Progress Notes (Signed)
Pharmacist Chemotherapy Monitoring - Initial Assessment    Anticipated start date: 10/30/22   The following has been reviewed per standard work regarding the patient's treatment regimen: The patient's diagnosis, treatment plan and drug doses, and organ/hematologic function Lab orders and baseline tests specific to treatment regimen  The treatment plan start date, drug sequencing, and pre-medications Prior authorization status  Patient's documented medication list, including drug-drug interaction screen and prescriptions for anti-emetics and supportive care specific to the treatment regimen The drug concentrations, fluid compatibility, administration routes, and timing of the medications to be used The patient's access for treatment and lifetime cumulative dose history, if applicable  The patient's medication allergies and previous infusion related reactions, if applicable   Changes made to treatment plan:  N/A  Follow up needed:  N/A   Betty Anderson, RPH, 10/23/2022  12:05 PM

## 2022-10-27 NOTE — Progress Notes (Unsigned)
[facility] OFFICE PROGRESS NOTE  Rodrigo Ran, MD [address] [place] [address]  DIAGNOSIS: Recurrent lung cancer initially diagnosed as stage IIIA (T1b, N2, M0) non-small cell lung cancer presented with right upper lobe lung nodule in addition to mediastinal lymphadenopathy diagnosed in January 2021. She had some new lung nodules in the right upper lobe in June 2024   PRIOR THERAPY:  1) Concurrent chemoradiation with weekly carboplatin for AUC of 2 and paclitaxel 45 NG/M2.  Status post 5 cycles. Last dose received on 05/30/19. 2) Consolidation immunotherapy with Imfinzi 1500 mg/kg IV every 4 weeks.  First dose expected on 07/21/2019. Status post 13 cycles. 3) SBRT to the large right upper lobe pulmonary nodules under the care of Dr. Mitzi Hansen completed October 18, 2021.  CURRENT THERAPY: Hospice  INTERVAL HISTORY: Betty Anderson 67 y.o. female returns to clinic today for a follow-up visit accompanied by her husband. In June/July 2024, the patient's restaging CT scan and PET scan showed concerning findings for disease progression and metastatic disease.  Therefore, Dr. Arbutus Ped recommended systemic chemotherapy.  However, the patient had significant hypotension which precluded her from being able to start treatment. She was asymptomatic. Her hypotension was not able to be corrected with IVF. She was even seen in the ER and workup was negative, she left the ER and declined inpatient workup.   She was referred to cardiology. She saw Dr. Jacinto Halim on 10/21/22. She has subclavian arterial stenosis bilaterally. Therefore, the low blood pressure is false. She also has carotid artery stenosis with fairly high grade stenosis in the left. Because she is not a candidate for high risk intervention, Dr. Jacinto Halim is concerned about chemotherapy which would induce hypotension and cause end organ damage and stroke or mesenteric ischemia. Therefore, they had a lengthy goals of care discussion. Given that  treatment is palliative, she decided to forego any chemotherapy and consider hospice. She signed papers for DNR.   The patient denies any major changes in her health since last being seen.  She denies any fevers or night sweats.  She reports that she always gets cold which is not new. She reports she has anemia all of her life. She is taking B12 supplements but not iron supplements. She reports similar baseline dyspnea on exertion.  Denies any cough, chest pain, or hemoptysis. She has not had any nausea or vomiting since she left the emergency room a few weeks ago. She struggles with constipation at baseline and takes Senokot. Therefore, this is better at this time. She sees palliative care here at [facility]. She needs a refill of her pain medication.  Denies any headache or visual changes.   MEDICAL HISTORY: Past Medical History:  Diagnosis Date   COPD (chronic obstructive pulmonary disease) (HCC)    emphysema   Hyperlipidemia    Hypertension    lung ca dx'd 03/2019   Lung nodule    Paroxysmal atrial flutter (HCC)    per cardiology note   Peripheral artery disease (HCC)    Tobacco abuse    Vitamin D deficiency    Wears glasses     ALLERGIES:  is allergic to lisinopril.  MEDICATIONS:  Current Outpatient Medications  Medication Sig Dispense Refill   aspirin EC 81 MG tablet Take 1 tablet (81 mg total) by mouth daily. 90 tablet 3   Cholecalciferol (DIALYVITE VITAMIN D 5000) 125 MCG (5000 UT) capsule Take 5,000 Units by mouth daily.     escitalopram (LEXAPRO) 10 MG tablet Take 10  mg by mouth daily.     guaiFENesin (MUCINEX) 600 MG 12 hr tablet Take by mouth 2 (two) times daily.     HYDROcodone-acetaminophen (NORCO) 10-325 MG tablet Take 1 tablet by mouth every 6 (six) hours as needed. 60 tablet 0   meloxicam (MOBIC) 7.5 MG tablet Take 1 tablet (7.5 mg total) by mouth in the morning and at bedtime. 60 tablet 0   ondansetron (ZOFRAN-ODT) 4 MG disintegrating tablet Take 1 tablet (4 mg total) by  mouth every 8 (eight) hours as needed for nausea or vomiting. 20 tablet 0   pantoprazole (PROTONIX) 40 MG tablet TAKE ONE TABLET BY MOUTH TWICE A DAY 180 tablet 2   prochlorperazine (COMPAZINE) 10 MG tablet Take 1 tablet (10 mg total) by mouth every 6 (six) hours as needed. 30 tablet 2   rosuvastatin (CRESTOR) 20 MG tablet Take 20 mg by mouth every evening.      Tiotropium Bromide-Olodaterol (STIOLTO RESPIMAT) 2.5-2.5 MCG/ACT AERS Inhale 2 puffs into the lungs daily. (Patient not taking: Reported on 10/21/2022) 4 g 0   vitamin B-12 (CYANOCOBALAMIN) 1000 MCG tablet Take 1,000 mcg by mouth daily.     No current facility-administered medications for this visit.    SURGICAL HISTORY:  Past Surgical History:  Procedure Laterality Date   BRONCHIAL NEEDLE ASPIRATION BIOPSY  04/12/2019   Procedure: BRONCHIAL NEEDLE ASPIRATION BIOPSIES;  Surgeon: Josephine Igo, DO;  Location: [facility];  Service: Cardiopulmonary;;   ENDOBRONCHIAL ULTRASOUND N/A 04/12/2019   Procedure: ENDOBRONCHIAL ULTRASOUND;  Surgeon: Josephine Igo, DO;  Location: [facility];  Service: Cardiopulmonary;  Laterality: N/A;   OVARIAN CYST REMOVAL     TONSILLECTOMY     TUBAL LIGATION     VIDEO BRONCHOSCOPY N/A 04/12/2019   Procedure: VIDEO BRONCHOSCOPY WITHOUT FLUORO;  Surgeon: Josephine Igo, DO;  Location: [facility];  Service: Cardiopulmonary;  Laterality: N/A;    REVIEW OF SYSTEMS:   onstitutional: Positive for stable fatigue. Negative for appetite change, chills, fever and unexpected weight change.  HENT:  Negative for mouth sores, nosebleeds, and sore throat.  Eyes: Negative for eye problems and icterus.  Respiratory: Positive for dyspnea on exertion. Negative for hemoptysis, cough, and wheezing.   Cardiovascular: Negative for chest pain and leg swelling.  Gastrointestinal: Positive occasional constipation (none at this time). Negative for diarrhea, nausea and vomiting.  Genitourinary: Negative for bladder  incontinence, difficulty urinating, dysuria, frequency and hematuria.   Musculoskeletal: Negative for back pain, gait problem, neck pain and neck stiffness.  Skin: Negative for itching and rash.  Neurological: Negative for dizziness, extremity weakness, gait problem, headaches, light-headedness and seizures.  Hematological: Negative for adenopathy. Does not bruise/bleed easily.  Psychiatric/Behavioral: Negative for confusion, depression and sleep disturbance. The patient is not nervous/anxious.       PHYSICAL EXAMINATION:  There were no vitals taken for this visit.  ECOG PERFORMANCE STATUS: 1  Physical Exam  Constitutional: Oriented to person, place, and time and chronic appearing female and in no distress.  HENT:  Head: Normocephalic and atraumatic.  Mouth/Throat: Oropharynx is clear and moist. No oropharyngeal exudate.  Eyes: Conjunctivae are normal. Right eye exhibits no discharge. Left eye exhibits no discharge. No scleral icterus.  Neck: Normal range of motion. Neck supple.  Cardiovascular: Normal rate, regular rhythm, quiet murmur noted. intact distal pulses.   Pulmonary/Chest: Effort normal and breath sounds normal. No respiratory distress. No wheezes. No rales.  Abdominal: Soft. Bowel sounds are normal. Exhibits no distension and no mass. There is  no tenderness.  Musculoskeletal: No focal tenderness to palpation in her back. Normal range of motion. Exhibits no edema.  Lymphadenopathy:    No cervical adenopathy.  Neurological: Alert and oriented to person, place, and time. Exhibits muscle wasting. Gait normal. Coordination normal.  Skin: Skin is warm and dry. No rash noted. Not diaphoretic. No erythema. No pallor.  Psychiatric: Mood, memory and judgment normal.  Vitals reviewed.  LABORATORY DATA: Lab Results  Component Value Date   WBC 7.6 10/20/2022   HGB 9.7 (L) 10/20/2022   HCT 30.6 (L) 10/20/2022   MCV 96.8 10/20/2022   PLT 354 10/20/2022      Chemistry       Component Value Date/Time   NA 137 10/20/2022 0730   K 4.1 10/20/2022 0730   CL 105 10/20/2022 0730   CO2 27 10/20/2022 0730   BUN 20 10/20/2022 0730   CREATININE 1.35 (H) 10/20/2022 0730      Component Value Date/Time   CALCIUM 9.2 10/20/2022 0730   ALKPHOS 67 10/20/2022 0730   AST 9 (L) 10/20/2022 0730   ALT 5 10/20/2022 0730   BILITOT 0.3 10/20/2022 0730       RADIOGRAPHIC STUDIES:  No results found.   ASSESSMENT/PLAN:  This is a very pleasant 67 year old Caucasian female with recurrent non-small cell lung cancer initially diagnosed as stage IIIa non-small cell lung cancer who presented with a right upper lobe lung nodule and mediastinal lymphadenopathy.  She was diagnosed in January 2021.  New right lung nodules in June 2024   She completed course of concurrent chemoradiation with weekly carboplatin for an AUC of 2 and flexion of Taxol 45 mg/m.  She is status post 5 cycles and tolerated treatment well with a partial response.   She then underwent consolidation immunotherapy with Imfinzi 1500 mg IV every 4 weeks status post 13 cycles.  Her CT scan of the chest on Aug 22, 2021 showed no concerning findings for disease progression except for enlargement of the nodular area along the pleural surface in the right upper lobe concerning for disease recurrence this was followed by a PET scan on 09/05/2021 and that showed increased radiotracer uptake in the nodular density within the right upper lobe concerning for new site of metabolically active tumor. The patient underwent curative radiotherapy to this lesion with SBRT under the care of Dr. Mitzi Hansen completed on October 18, 2021.    Her CT scan in June 2024 showed increase in size of the density of the right lung and increase of other pulmonary nodules in the right lung concerning for disease progression. Her PET scan showed increased radiotracer uptake in the medial aspect of the right upper lobe which is suspicious for locally recurrent  tumor and a small focus of increased uptake in the anterior right upper lobe is suspicious and could be inflammation and other tiny nodules that are below size threshold.    Dr. Arbutus Ped recommended systemic chemotherapy with carboplatin for an AUC of 5, Taxol 175 mg/m mg/m, and Keytruda 200 mg IV every 3 weeks. However, we were unable to start treatment due to the risks due to her hypotension.   She was seen by cardiology. Dr. Jacinto Halim explained that the blood pressure is always gone to be low in view of severe bilateral subclavian artery stenosis and also in the lower extremity he will not get adequate blood pressure as she has abdominal aortic stenosis and also lower extremity arterial disease. Concern that chemotherapy will cause hypotension which could cause  end organ damage and or stroke/mesenteric ischemia.   The patient opted to become DNR.   The patient was seen with Dr. Arbutus Ped today. We discussed this. The patient fully understands this and is in agreement to not pursue any treatment for her lung cancer. She understands that this will grow and may cause symptoms. We will refer her to outpatient palliative care and eventially she will need hospice.   We will continue to follow with [facility] palliative care for the time being for her pain management. In the future, when hospice is enrolled, then hospice will take over pain management.   We will not arrange any further appointments but will be happy to see her on an as needed basis.   We will request refill of her pain medication.   She was instructed to resume taking an iron supplement for her anemia. We will check iron, ferritin,  b12, and folate for her anemia.   The patient was advised to call immediately if she has any concerning symptoms in the interval. The patient voices understanding of current disease status and treatment options and is in agreement with the current care plan. All questions were answered. The patient knows to call the  clinic with any problems, questions or concerns. We can certainly see the patient much sooner if necessary  No orders of the defined types were placed in this encounter.    Florinda Taflinger L Taima Rada, PA-C 10/27/22   ADDENDUM: Hematology/Oncology Attending: I had a face-to-face encounter with the patient today.  I reviewed her records, lab and recommended her care plan.  This is a very pleasant 67 years old white female with recurrent non-small cell lung cancer that was initially diagnosed as stage IIIa in January 2021 with evidence for metastatic disease in June 2024.  The patient is status post several treatment in the past including course of concurrent chemoradiation in addition to 1 year of immunotherapy then SBRT to large right upper lobe pulmonary nodules in July 2023. The patient was supposed to start treatment with systemic chemotherapy in combination with immunotherapy for the recurrent disease but unfortunately she continues to have persistent hypotension.  She was evaluated recently by cardiology, Dr. Jacinto Halim and she was found to have subclavian arterial stenosis bilaterally causing the falls low blood pressure reading but the patient also has carotid artery stenosis with fairly high-grade stenosis in the left and multiple other ischemic issues and there was concern about treatment for chemoimmunotherapy that may complicate her condition and cause stroke, cardiac damage and mesenteric ischemia. There was a long discussion with the patient and with her cardiologist about her condition and she agreed to proceed with palliative care and hospice at this point.  We have the visit as a conclusive meeting regarding her condition.  The patient and her daughter were very appreciative for the care she received over the years.  We will consult with the palliative care and hospice team but the patient knows to call immediately if she has any other concerning symptoms. The total time spent in the  appointment was 30 minutes. Disclaimer: This note was dictated with voice recognition software. Similar sounding words can inadvertently be transcribed and may be missed upon review. Lajuana Matte, MD

## 2022-10-29 MED FILL — Fosaprepitant Dimeglumine For IV Infusion 150 MG (Base Eq): INTRAVENOUS | Qty: 5 | Status: AC

## 2022-10-29 MED FILL — Dexamethasone Sodium Phosphate Inj 100 MG/10ML: INTRAMUSCULAR | Qty: 1 | Status: AC

## 2022-10-30 ENCOUNTER — Inpatient Hospital Stay: Payer: Medicare Other | Admitting: Physician Assistant

## 2022-10-30 ENCOUNTER — Ambulatory Visit: Payer: Medicare Other | Admitting: Internal Medicine

## 2022-10-30 ENCOUNTER — Other Ambulatory Visit: Payer: Self-pay

## 2022-10-30 ENCOUNTER — Encounter: Payer: Self-pay | Admitting: Physician Assistant

## 2022-10-30 ENCOUNTER — Inpatient Hospital Stay: Payer: Medicare Other | Attending: Physician Assistant

## 2022-10-30 ENCOUNTER — Other Ambulatory Visit: Payer: Self-pay | Admitting: Physician Assistant

## 2022-10-30 ENCOUNTER — Other Ambulatory Visit: Payer: Medicare Other

## 2022-10-30 ENCOUNTER — Ambulatory Visit: Payer: Medicare Other

## 2022-10-30 VITALS — BP 69/56 | HR 85 | Temp 98.4°F | Resp 16 | Ht 63.0 in | Wt 152.6 lb

## 2022-10-30 DIAGNOSIS — Z79899 Other long term (current) drug therapy: Secondary | ICD-10-CM | POA: Insufficient documentation

## 2022-10-30 DIAGNOSIS — Z923 Personal history of irradiation: Secondary | ICD-10-CM | POA: Insufficient documentation

## 2022-10-30 DIAGNOSIS — D649 Anemia, unspecified: Secondary | ICD-10-CM | POA: Insufficient documentation

## 2022-10-30 DIAGNOSIS — E538 Deficiency of other specified B group vitamins: Secondary | ICD-10-CM

## 2022-10-30 DIAGNOSIS — C3411 Malignant neoplasm of upper lobe, right bronchus or lung: Secondary | ICD-10-CM | POA: Diagnosis present

## 2022-10-30 DIAGNOSIS — J439 Emphysema, unspecified: Secondary | ICD-10-CM | POA: Insufficient documentation

## 2022-10-30 DIAGNOSIS — Z66 Do not resuscitate: Secondary | ICD-10-CM | POA: Diagnosis not present

## 2022-10-30 DIAGNOSIS — Z791 Long term (current) use of non-steroidal anti-inflammatories (NSAID): Secondary | ICD-10-CM | POA: Insufficient documentation

## 2022-10-30 DIAGNOSIS — R5383 Other fatigue: Secondary | ICD-10-CM | POA: Diagnosis not present

## 2022-10-30 DIAGNOSIS — F17200 Nicotine dependence, unspecified, uncomplicated: Secondary | ICD-10-CM | POA: Diagnosis not present

## 2022-10-30 DIAGNOSIS — I6523 Occlusion and stenosis of bilateral carotid arteries: Secondary | ICD-10-CM | POA: Insufficient documentation

## 2022-10-30 DIAGNOSIS — R590 Localized enlarged lymph nodes: Secondary | ICD-10-CM | POA: Diagnosis not present

## 2022-10-30 DIAGNOSIS — Z888 Allergy status to other drugs, medicaments and biological substances status: Secondary | ICD-10-CM | POA: Insufficient documentation

## 2022-10-30 DIAGNOSIS — I739 Peripheral vascular disease, unspecified: Secondary | ICD-10-CM | POA: Diagnosis not present

## 2022-10-30 DIAGNOSIS — Z9089 Acquired absence of other organs: Secondary | ICD-10-CM | POA: Insufficient documentation

## 2022-10-30 DIAGNOSIS — Z515 Encounter for palliative care: Secondary | ICD-10-CM

## 2022-10-30 DIAGNOSIS — G893 Neoplasm related pain (acute) (chronic): Secondary | ICD-10-CM

## 2022-10-30 DIAGNOSIS — K5900 Constipation, unspecified: Secondary | ICD-10-CM | POA: Insufficient documentation

## 2022-10-30 DIAGNOSIS — Z7189 Other specified counseling: Secondary | ICD-10-CM | POA: Diagnosis not present

## 2022-10-30 DIAGNOSIS — I959 Hypotension, unspecified: Secondary | ICD-10-CM | POA: Insufficient documentation

## 2022-10-30 LAB — CBC WITH DIFFERENTIAL (CANCER CENTER ONLY)
Abs Immature Granulocytes: 0.03 10*3/uL (ref 0.00–0.07)
Basophils Absolute: 0.1 10*3/uL (ref 0.0–0.1)
Basophils Relative: 1 %
Eosinophils Absolute: 0.2 10*3/uL (ref 0.0–0.5)
Eosinophils Relative: 2 %
HCT: 30.9 % — ABNORMAL LOW (ref 36.0–46.0)
Hemoglobin: 9.9 g/dL — ABNORMAL LOW (ref 12.0–15.0)
Immature Granulocytes: 0 %
Lymphocytes Relative: 11 %
Lymphs Abs: 0.9 10*3/uL (ref 0.7–4.0)
MCH: 30.7 pg (ref 26.0–34.0)
MCHC: 32 g/dL (ref 30.0–36.0)
MCV: 95.7 fL (ref 80.0–100.0)
Monocytes Absolute: 0.6 10*3/uL (ref 0.1–1.0)
Monocytes Relative: 8 %
Neutro Abs: 6.5 10*3/uL (ref 1.7–7.7)
Neutrophils Relative %: 78 %
Platelet Count: 364 10*3/uL (ref 150–400)
RBC: 3.23 MIL/uL — ABNORMAL LOW (ref 3.87–5.11)
RDW: 13.9 % (ref 11.5–15.5)
WBC Count: 8.4 10*3/uL (ref 4.0–10.5)
nRBC: 0 % (ref 0.0–0.2)

## 2022-10-30 LAB — CMP (CANCER CENTER ONLY)
ALT: 5 U/L (ref 0–44)
AST: 7 U/L — ABNORMAL LOW (ref 15–41)
Albumin: 3.4 g/dL — ABNORMAL LOW (ref 3.5–5.0)
Alkaline Phosphatase: 71 U/L (ref 38–126)
Anion gap: 6 (ref 5–15)
BUN: 17 mg/dL (ref 8–23)
CO2: 26 mmol/L (ref 22–32)
Calcium: 8.8 mg/dL — ABNORMAL LOW (ref 8.9–10.3)
Chloride: 106 mmol/L (ref 98–111)
Creatinine: 1.35 mg/dL — ABNORMAL HIGH (ref 0.44–1.00)
GFR, Estimated: 43 mL/min — ABNORMAL LOW (ref >60)
Glucose, Bld: 83 mg/dL (ref 70–99)
Potassium: 3.9 mmol/L (ref 3.5–5.1)
Sodium: 138 mmol/L (ref 135–145)
Total Bilirubin: 0.3 mg/dL (ref 0.3–1.2)
Total Protein: 7 g/dL (ref 6.5–8.1)

## 2022-10-30 LAB — TSH: TSH: 2.822 u[IU]/mL (ref 0.350–4.500)

## 2022-10-30 LAB — IRON AND IRON BINDING CAPACITY (CC-WL,HP ONLY)
Iron: 24 ug/dL — ABNORMAL LOW (ref 28–170)
Saturation Ratios: 11 % (ref 10.4–31.8)
TIBC: 227 ug/dL — ABNORMAL LOW (ref 250–450)
UIBC: 203 ug/dL (ref 148–442)

## 2022-10-30 LAB — VITAMIN B12: Vitamin B-12: 1464 pg/mL — ABNORMAL HIGH (ref 180–914)

## 2022-10-30 LAB — FERRITIN: Ferritin: 249 ng/mL (ref 11–307)

## 2022-10-30 LAB — FOLATE: Folate: 4.3 ng/mL — ABNORMAL LOW (ref >5.9)

## 2022-10-30 MED ORDER — HYDROCODONE-ACETAMINOPHEN 10-325 MG PO TABS
1.0000 | ORAL_TABLET | Freq: Four times a day (QID) | ORAL | 0 refills | Status: AC | PRN
Start: 2022-10-30 — End: ?

## 2022-10-30 MED ORDER — FOLIC ACID 1 MG PO TABS
1.0000 mg | ORAL_TABLET | Freq: Every day | ORAL | 2 refills | Status: AC
Start: 2022-10-30 — End: ?

## 2022-10-30 MED ORDER — MELOXICAM 7.5 MG PO TABS: 7.5000 mg | ORAL_TABLET | Freq: Two times a day (BID) | ORAL | 0 refills | Status: AC

## 2022-10-30 NOTE — Progress Notes (Signed)
This nurse reached out to [facility] about Palliative and Hopsice care services for this patient.  They stated they will contact the patient and evaluate what type of services the patient may need.  Return call will be made to this nurse.  No further questions or concerns at this time.

## 2022-10-31 ENCOUNTER — Ambulatory Visit: Payer: Medicare Other

## 2022-11-01 ENCOUNTER — Ambulatory Visit: Payer: Medicare Other

## 2022-11-01 ENCOUNTER — Inpatient Hospital Stay: Payer: Medicare Other

## 2022-11-05 ENCOUNTER — Ambulatory Visit: Payer: Medicare Other | Admitting: Internal Medicine

## 2022-11-05 ENCOUNTER — Other Ambulatory Visit: Payer: Medicare Other

## 2022-11-06 ENCOUNTER — Other Ambulatory Visit: Payer: Medicare Other

## 2022-11-10 ENCOUNTER — Ambulatory Visit: Payer: Medicare Other

## 2022-11-10 ENCOUNTER — Ambulatory Visit: Payer: Medicare Other | Admitting: Internal Medicine

## 2022-11-10 ENCOUNTER — Other Ambulatory Visit: Payer: Medicare Other

## 2022-11-10 NOTE — Progress Notes (Deleted)
Patient name: Betty Anderson MRN: [id number] DOB: 03-29-1956 Sex: female  REASON FOR CONSULT: 6 month follow-up, surveillance carotid artery disease   HPI: Betty Anderson is a 67 y.o. female,  with history of COPD, lung cancer, and tobacco abuse that presents for 6 month follow-up for surveillance of her carotid artery disease.  This has been followed by her PCP Dr. Waynard Anderson.  She denies any history of strokes or TIAs.  Her most recent carotid ultrasound on 05/07/2022 showed a 1 to 39% right ICA stenosis with a 60 to 79% left ICA stenosis with evidence of common carotid disease.  She states she smokes about 3 packs/day.  She states she has some dizziness that has been ongoing for years usually after she sits up and this is no worse.  Past Medical History:  Diagnosis Date   COPD (chronic obstructive pulmonary disease) (HCC)    emphysema   Hyperlipidemia    Hypertension    lung ca dx'd 03/2019   Lung nodule    Paroxysmal atrial flutter (HCC)    per cardiology note   Peripheral artery disease (HCC)    Tobacco abuse    Vitamin D deficiency    Wears glasses     Past Surgical History:  Procedure Laterality Date   BRONCHIAL NEEDLE ASPIRATION BIOPSY  04/12/2019   Procedure: BRONCHIAL NEEDLE ASPIRATION BIOPSIES;  Surgeon: Josephine Igo, DO;  Location: [facility];  Service: Cardiopulmonary;;   ENDOBRONCHIAL ULTRASOUND N/A 04/12/2019   Procedure: ENDOBRONCHIAL ULTRASOUND;  Surgeon: Josephine Igo, DO;  Location: [facility];  Service: Cardiopulmonary;  Laterality: N/A;   OVARIAN CYST REMOVAL     TONSILLECTOMY     TUBAL LIGATION     VIDEO BRONCHOSCOPY N/A 04/12/2019   Procedure: VIDEO BRONCHOSCOPY WITHOUT FLUORO;  Surgeon: Josephine Igo, DO;  Location: [facility];  Service: Cardiopulmonary;  Laterality: N/A;    Family History  Problem Relation Age of Onset   Stroke Mother    Stroke Father    Lung cancer Father    Mesothelioma Brother    Lung cancer Sister    Colon  cancer Sister    Anuerysm Brother     SOCIAL HISTORY: Social History   Socioeconomic History   Marital status: Married    Spouse name: Not on file   Number of children: 3   Years of education: Not on file   Highest education level: Not on file  Occupational History   Not on file  Tobacco Use   Smoking status: Every Day    Current packs/day: 3.50    Average packs/day: 3.5 packs/day for 60.6 years (212.1 ttl pk-yrs)    Types: Cigarettes    Start date: 03/31/1962   Smokeless tobacco: Never   Tobacco comments:    Pt currently smoking 2ppd  Vaping Use   Vaping status: Never Used  Substance and Sexual Activity   Alcohol use: Not Currently    Alcohol/week: 8.0 standard drinks of alcohol    Types: 8 Shots of liquor per week   Drug use: Never   Sexual activity: Not on file  Other Topics Concern   Not on file  Social History Narrative   Not on file   Social Determinants of Health   Financial Resource Strain: Not on file  Food Insecurity: Not on file  Transportation Needs: Not on file  Physical Activity: Not on file  Stress: Not on file  Social Connections: Not on file  Intimate Partner Violence: Not on file  Allergies  Allergen Reactions   Lisinopril Cough    Current Outpatient Medications  Medication Sig Dispense Refill   aspirin EC 81 MG tablet Take 1 tablet (81 mg total) by mouth daily. 90 tablet 3   Cholecalciferol (DIALYVITE VITAMIN D 5000) 125 MCG (5000 UT) capsule Take 5,000 Units by mouth daily.     escitalopram (LEXAPRO) 10 MG tablet Take 10 mg by mouth daily.     folic acid (FOLVITE) 1 MG tablet Take 1 tablet (1 mg total) by mouth daily. 30 tablet 2   guaiFENesin (MUCINEX) 600 MG 12 hr tablet Take by mouth 2 (two) times daily.     HYDROcodone-acetaminophen (NORCO) 10-325 MG tablet Take 1 tablet by mouth every 6 (six) hours as needed. 60 tablet 0   meloxicam (MOBIC) 7.5 MG tablet Take 1 tablet (7.5 mg total) by mouth in the morning and at bedtime. 60 tablet  0   ondansetron (ZOFRAN-ODT) 4 MG disintegrating tablet Take 1 tablet (4 mg total) by mouth every 8 (eight) hours as needed for nausea or vomiting. 20 tablet 0   pantoprazole (PROTONIX) 40 MG tablet TAKE ONE TABLET BY MOUTH TWICE A DAY 180 tablet 2   prochlorperazine (COMPAZINE) 10 MG tablet Take 1 tablet (10 mg total) by mouth every 6 (six) hours as needed. 30 tablet 2   rosuvastatin (CRESTOR) 20 MG tablet Take 20 mg by mouth every evening.      Tiotropium Bromide-Olodaterol (STIOLTO RESPIMAT) 2.5-2.5 MCG/ACT AERS Inhale 2 puffs into the lungs daily. (Patient not taking: Reported on 10/21/2022) 4 g 0   vitamin B-12 (CYANOCOBALAMIN) 1000 MCG tablet Take 1,000 mcg by mouth daily.     No current facility-administered medications for this visit.    REVIEW OF SYSTEMS:  [X]  denotes positive finding, [ ]  denotes negative finding Cardiac  Comments:  Chest pain or chest pressure:    Shortness of breath upon exertion:    Short of breath when lying flat:    Irregular heart rhythm:        Vascular    Pain in calf, thigh, or hip brought on by ambulation:    Pain in feet at night that wakes you up from your sleep:     Blood clot in your veins:    Leg swelling:         Pulmonary    Oxygen at home:    Productive cough:     Wheezing:         Neurologic    Sudden weakness in arms or legs:     Sudden numbness in arms or legs:     Sudden onset of difficulty speaking or slurred speech:    Temporary loss of vision in one eye:     Problems with dizziness:         Gastrointestinal    Blood in stool:     Vomited blood:         Genitourinary    Burning when urinating:     Blood in urine:        Psychiatric    Major depression:         Hematologic    Bleeding problems:    Problems with blood clotting too easily:        Skin    Rashes or ulcers:        Constitutional    Fever or chills:      PHYSICAL EXAM: There were no vitals filed for this visit.   GENERAL:  The patient is a  well-nourished female, in no acute distress. The vital signs are documented above. CARDIAC: There is a regular rate and rhythm.  VASCULAR:  Palpable radial pulses bilaterally PULMONARY: No respiratory distress. ABDOMEN: Soft and non-tender. MUSCULOSKELETAL: There are no major deformities or cyanosis. NEUROLOGIC: No focal weakness or paresthesias are detected.  CN II-XII grossly intact. SKIN: There are no ulcers or rashes noted. PSYCHIATRIC: The patient has a normal affect.  DATA:   Carotid US   Assessment/Plan:  67 y.o. female,  with history of COPD, lung cancer, and tobacco abuse that presents for 6 month follow-up of her carotid artery disease.  Discussed that her carotid duplex shows minimal 1 to 39% stenosis in the right ICA and 60 to 79% left ICA stenosis.  In the absence of strokes or TIAs, discussed her asymptomatic carotid disease can be safely observed for now.  Discussed guidelines for surgical intervention are for greater than 80% ICA stenosis.  I will plan to see her in 6 months and we should probably do more frequent surveillance.  She does have retrograde flow in the right vertebral artery with bidirectional flow in the left vertebral artery but this has been consistent with ultrasounds back to 2020 with no worsening posterior circulation symptoms and I think we can continue observing this for now.   Cephus Shelling, MD [facility] Office: [phone]

## 2022-11-11 ENCOUNTER — Ambulatory Visit (HOSPITAL_COMMUNITY): Payer: Medicare Other

## 2022-11-11 ENCOUNTER — Ambulatory Visit: Payer: Medicare Other | Admitting: Vascular Surgery

## 2022-11-12 ENCOUNTER — Ambulatory Visit: Payer: Medicare Other

## 2022-11-12 ENCOUNTER — Other Ambulatory Visit: Payer: Medicare Other

## 2022-11-13 ENCOUNTER — Other Ambulatory Visit: Payer: Medicare Other

## 2022-11-13 ENCOUNTER — Ambulatory Visit: Payer: Medicare Other

## 2022-11-18 NOTE — Progress Notes (Deleted)
Palliative Medicine [facility]  [phone] [phone]   Name: Betty Anderson Date: 11/18/2022 MRN: [id number]  DOB: 1955-05-30  Patient Care Team: Rodrigo Ran, MD as PCP - General (Internal Medicine) Syliva Overman, RN as Oncology Nurse Navigator   INTERVAL HISTORY: Betty Anderson is a 67 y.o. female with oncologic medical history including non-small cell lung cancer (04/2019) currently on observation, as well as A-Flutter, CAD, COPD, and HTN. Palliative ask to see for symptom and pain management and goals of care.   SOCIAL HISTORY:    Betty Anderson reports that she has been smoking cigarettes. She started smoking about 60 years ago. She has a 212.2 pack-year smoking history. She has never used smokeless tobacco. She reports that she does not currently use alcohol after a past usage of about 8.0 standard drinks of alcohol per week. She reports that she does not use drugs.  ADVANCE DIRECTIVES:  None on file  CODE STATUS: Full Code  PAST MEDICAL HISTORY: Past Medical History:  Diagnosis Date   COPD (chronic obstructive pulmonary disease) (HCC)    emphysema   Hyperlipidemia    Hypertension    lung ca dx'd 03/2019   Lung nodule    Paroxysmal atrial flutter (HCC)    per cardiology note   Peripheral artery disease (HCC)    Tobacco abuse    Vitamin D deficiency    Wears glasses     ALLERGIES:  is allergic to lisinopril.  MEDICATIONS:  Current Outpatient Medications  Medication Sig Dispense Refill   aspirin EC 81 MG tablet Take 1 tablet (81 mg total) by mouth daily. 90 tablet 3   Cholecalciferol (DIALYVITE VITAMIN D 5000) 125 MCG (5000 UT) capsule Take 5,000 Units by mouth daily.     escitalopram (LEXAPRO) 10 MG tablet Take 10 mg by mouth daily.     folic acid (FOLVITE) 1 MG tablet Take 1 tablet (1 mg total) by mouth daily. 30 tablet 2   guaiFENesin (MUCINEX) 600 MG 12 hr tablet Take by mouth 2 (two) times daily.      HYDROcodone-acetaminophen (NORCO) 10-325 MG tablet Take 1 tablet by mouth every 6 (six) hours as needed. 60 tablet 0   meloxicam (MOBIC) 7.5 MG tablet Take 1 tablet (7.5 mg total) by mouth in the morning and at bedtime. 60 tablet 0   ondansetron (ZOFRAN-ODT) 4 MG disintegrating tablet Take 1 tablet (4 mg total) by mouth every 8 (eight) hours as needed for nausea or vomiting. 20 tablet 0   pantoprazole (PROTONIX) 40 MG tablet TAKE ONE TABLET BY MOUTH TWICE A DAY 180 tablet 2   prochlorperazine (COMPAZINE) 10 MG tablet Take 1 tablet (10 mg total) by mouth every 6 (six) hours as needed. 30 tablet 2   rosuvastatin (CRESTOR) 20 MG tablet Take 20 mg by mouth every evening.      Tiotropium Bromide-Olodaterol (STIOLTO RESPIMAT) 2.5-2.5 MCG/ACT AERS Inhale 2 puffs into the lungs daily. (Patient not taking: Reported on 10/21/2022) 4 g 0   vitamin B-12 (CYANOCOBALAMIN) 1000 MCG tablet Take 1,000 mcg by mouth daily.     No current facility-administered medications for this visit.    VITAL SIGNS: There were no vitals taken for this visit. There were no vitals filed for this visit.  Estimated body mass index is 27.03 kg/m as calculated from the following:   Height as of 10/30/22: 5\' 3"  (1.6 m).   Weight as of 10/30/22: 152 lb 9.6 oz (69.2 kg).  PERFORMANCE STATUS (ECOG) : 1 - Symptomatic but completely ambulatory  Assessment NAD RRR Normal breathing pattern AAO x4  IMPRESSION:     Pain:  Betty Anderson reports pain is manageable most days with medication. Describes abdominal pain as constant and sharp. Pain worsens depending on position, when urinating, having bowel movement, and passing gas. She is taking mobic and hydrocodone as needed. Not requiring around the clock. Speaks of anxiousness of finding out what is going on with her health. Support provided.   No changes in regimen at this time. Encouraged bowel regimen.   We will continue to closely monitor and adjust and needed.     PLAN: Mobic 7.5 mg twice daily Norco 1 tablet every 6 hours as needed Continue Senna-S to 2 tablets twice daily for constipation. Palliative will plan to see patient back in 3-4 weeks, or sooner, in collaboration to other oncology appointments.    Patient expressed understanding and was in agreement with this plan. She also understands that she can call the clinic at any time with any questions, concerns, or complaints.   Any controlled substances utilized were prescribed in the context of palliative care. PDMP has been reviewed.   Visit consisted of counseling and education dealing with the complex and emotionally intense issues of symptom management and palliative care in the setting of serious and potentially life-threatening illness.Greater than 50%  of this time was spent counseling and coordinating care related to the above assessment and plan.   Willette Alma, AGPCNP-BC  Palliative Medicine Team/[facility]  *Please note that this is a verbal dictation therefore any spelling or grammatical errors are due to the "[employer]" system interpretation.

## 2022-11-19 ENCOUNTER — Inpatient Hospital Stay: Payer: Medicare Other | Admitting: Nurse Practitioner

## 2022-11-19 ENCOUNTER — Other Ambulatory Visit: Payer: Medicare Other

## 2022-11-19 ENCOUNTER — Telehealth: Payer: Self-pay

## 2022-11-19 ENCOUNTER — Ambulatory Visit: Payer: Medicare Other

## 2022-11-19 ENCOUNTER — Ambulatory Visit: Payer: Medicare Other | Admitting: Physician Assistant

## 2022-11-19 NOTE — Telephone Encounter (Signed)
This RN spoke with [facility] and confirmed pt is enrolled in their services. This RN then canceled pt appointment and called to confirm with pt, no answer, LVM. NP and PA notified.

## 2022-11-20 ENCOUNTER — Ambulatory Visit: Payer: Medicare Other | Admitting: Internal Medicine

## 2022-11-20 ENCOUNTER — Other Ambulatory Visit: Payer: Medicare Other

## 2022-11-20 ENCOUNTER — Ambulatory Visit: Payer: Medicare Other

## 2022-11-21 ENCOUNTER — Ambulatory Visit: Payer: Medicare Other

## 2022-11-22 ENCOUNTER — Ambulatory Visit: Payer: Medicare Other

## 2022-11-26 ENCOUNTER — Other Ambulatory Visit: Payer: Medicare Other

## 2022-11-27 ENCOUNTER — Other Ambulatory Visit: Payer: Medicare Other

## 2022-12-02 ENCOUNTER — Other Ambulatory Visit: Payer: Medicare Other

## 2022-12-02 ENCOUNTER — Ambulatory Visit: Payer: Medicare Other | Admitting: Internal Medicine

## 2022-12-02 ENCOUNTER — Ambulatory Visit: Payer: Medicare Other

## 2022-12-03 ENCOUNTER — Other Ambulatory Visit: Payer: Medicare Other

## 2022-12-04 ENCOUNTER — Ambulatory Visit: Payer: Medicare Other

## 2022-12-10 ENCOUNTER — Ambulatory Visit: Payer: Medicare Other | Admitting: Physician Assistant

## 2022-12-10 ENCOUNTER — Ambulatory Visit: Payer: Medicare Other

## 2022-12-10 ENCOUNTER — Other Ambulatory Visit: Payer: Medicare Other

## 2022-12-11 ENCOUNTER — Ambulatory Visit: Payer: Medicare Other | Admitting: Internal Medicine

## 2022-12-11 ENCOUNTER — Other Ambulatory Visit: Payer: Medicare Other

## 2022-12-11 ENCOUNTER — Ambulatory Visit: Payer: Medicare Other

## 2022-12-12 ENCOUNTER — Ambulatory Visit: Payer: Medicare Other

## 2022-12-13 ENCOUNTER — Ambulatory Visit: Payer: Medicare Other

## 2022-12-17 ENCOUNTER — Other Ambulatory Visit: Payer: Medicare Other

## 2023-02-18 NOTE — Telephone Encounter (Signed)
Telephone call  

## 2023-11-05 ENCOUNTER — Encounter: Payer: Self-pay | Admitting: Internal Medicine

## 2023-11-05 ENCOUNTER — Other Ambulatory Visit: Payer: Self-pay | Admitting: Internal Medicine

## 2023-11-05 DIAGNOSIS — C3490 Malignant neoplasm of unspecified part of unspecified bronchus or lung: Secondary | ICD-10-CM

## 2023-11-10 ENCOUNTER — Ambulatory Visit
Admission: RE | Admit: 2023-11-10 | Discharge: 2023-11-10 | Disposition: A | Discharge status: Home / Self Care | Source: Ambulatory Visit | Attending: Internal Medicine | Admitting: Internal Medicine

## 2023-11-10 DIAGNOSIS — C3490 Malignant neoplasm of unspecified part of unspecified bronchus or lung: Secondary | ICD-10-CM

## 2024-01-20 ENCOUNTER — Other Ambulatory Visit (HOSPITAL_COMMUNITY): Payer: Self-pay | Admitting: Adult Health

## 2024-01-20 DIAGNOSIS — R519 Headache, unspecified: Secondary | ICD-10-CM

## 2024-01-20 DIAGNOSIS — C781 Secondary malignant neoplasm of mediastinum: Secondary | ICD-10-CM

## 2024-01-20 DIAGNOSIS — C3490 Malignant neoplasm of unspecified part of unspecified bronchus or lung: Secondary | ICD-10-CM

## 2024-01-21 ENCOUNTER — Ambulatory Visit (HOSPITAL_COMMUNITY)
Admission: RE | Admit: 2024-01-21 | Discharge: 2024-01-21 | Disposition: A | Discharge status: Home / Self Care | Source: Ambulatory Visit | Attending: Adult Health | Admitting: Adult Health

## 2024-01-21 DIAGNOSIS — C3490 Malignant neoplasm of unspecified part of unspecified bronchus or lung: Secondary | ICD-10-CM | POA: Diagnosis present

## 2024-01-21 DIAGNOSIS — R519 Headache, unspecified: Secondary | ICD-10-CM | POA: Diagnosis present

## 2024-01-21 DIAGNOSIS — C781 Secondary malignant neoplasm of mediastinum: Secondary | ICD-10-CM | POA: Diagnosis present

## 2024-01-21 MED ORDER — GADOBUTROL 1 MMOL/ML IV SOLN
7.0000 mL | Freq: Once | INTRAVENOUS | Status: AC | PRN
  Administered 2024-01-21: 7 mL via INTRAVENOUS
